# Patient Record
Sex: Male | Born: 1945 | Race: White | Hispanic: No | Marital: Single | Smoking: Never smoker
Health system: Southern US, Community
[De-identification: ages and names within clinical notes are randomized; demographics above are authoritative.]

## PROBLEM LIST (undated history)

## (undated) DIAGNOSIS — Z9889 Other specified postprocedural states: Secondary | ICD-10-CM

## (undated) DIAGNOSIS — F329 Major depressive disorder, single episode, unspecified: Secondary | ICD-10-CM

## (undated) DIAGNOSIS — IMO0001 Reserved for inherently not codable concepts without codable children: Secondary | ICD-10-CM

## (undated) DIAGNOSIS — R319 Hematuria, unspecified: Secondary | ICD-10-CM

## (undated) DIAGNOSIS — Z87442 Personal history of urinary calculi: Secondary | ICD-10-CM

## (undated) DIAGNOSIS — N35919 Unspecified urethral stricture, male, unspecified site: Secondary | ICD-10-CM

## (undated) DIAGNOSIS — R351 Nocturia: Secondary | ICD-10-CM

## (undated) DIAGNOSIS — G5600 Carpal tunnel syndrome, unspecified upper limb: Secondary | ICD-10-CM

## (undated) DIAGNOSIS — Z8546 Personal history of malignant neoplasm of prostate: Secondary | ICD-10-CM

## (undated) DIAGNOSIS — I10 Essential (primary) hypertension: Secondary | ICD-10-CM

## (undated) DIAGNOSIS — Z87820 Personal history of traumatic brain injury: Secondary | ICD-10-CM

## (undated) DIAGNOSIS — Z87448 Personal history of other diseases of urinary system: Secondary | ICD-10-CM

## (undated) DIAGNOSIS — Z87440 Personal history of urinary (tract) infections: Secondary | ICD-10-CM

## (undated) DIAGNOSIS — C61 Malignant neoplasm of prostate: Secondary | ICD-10-CM

## (undated) DIAGNOSIS — H269 Unspecified cataract: Secondary | ICD-10-CM

## (undated) DIAGNOSIS — R112 Nausea with vomiting, unspecified: Secondary | ICD-10-CM

## (undated) DIAGNOSIS — I2510 Atherosclerotic heart disease of native coronary artery without angina pectoris: Secondary | ICD-10-CM

## (undated) DIAGNOSIS — Z923 Personal history of irradiation: Secondary | ICD-10-CM

## (undated) DIAGNOSIS — F32A Depression, unspecified: Secondary | ICD-10-CM

## (undated) DIAGNOSIS — M1990 Unspecified osteoarthritis, unspecified site: Secondary | ICD-10-CM

## (undated) DIAGNOSIS — Z973 Presence of spectacles and contact lenses: Secondary | ICD-10-CM

## (undated) DIAGNOSIS — Z87898 Personal history of other specified conditions: Secondary | ICD-10-CM

## (undated) HISTORY — PX: POLYPECTOMY: SHX149

## (undated) HISTORY — PX: OTHER SURGICAL HISTORY: SHX169

## (undated) HISTORY — PX: COLONOSCOPY: SHX174

## (undated) HISTORY — DX: Unspecified cataract: H26.9

## (undated) HISTORY — PX: CARDIOVASCULAR STRESS TEST: SHX262

---

## 2002-10-30 ENCOUNTER — Encounter: Admission: RE | Admit: 2002-10-30 | Discharge: 2002-10-30 | Payer: Self-pay | Admitting: Internal Medicine

## 2002-10-30 ENCOUNTER — Encounter: Payer: Self-pay | Admitting: Internal Medicine

## 2002-11-20 ENCOUNTER — Encounter: Admission: RE | Admit: 2002-11-20 | Discharge: 2002-11-20 | Payer: Self-pay | Admitting: Internal Medicine

## 2002-11-20 ENCOUNTER — Encounter: Payer: Self-pay | Admitting: Internal Medicine

## 2002-12-04 ENCOUNTER — Encounter: Admission: RE | Admit: 2002-12-04 | Discharge: 2002-12-04 | Payer: Self-pay | Admitting: Orthopedic Surgery

## 2002-12-04 ENCOUNTER — Encounter: Payer: Self-pay | Admitting: Orthopedic Surgery

## 2002-12-18 ENCOUNTER — Encounter: Admission: RE | Admit: 2002-12-18 | Discharge: 2002-12-18 | Payer: Self-pay | Admitting: Orthopedic Surgery

## 2002-12-18 ENCOUNTER — Encounter: Payer: Self-pay | Admitting: Orthopedic Surgery

## 2003-01-01 ENCOUNTER — Encounter: Admission: RE | Admit: 2003-01-01 | Discharge: 2003-01-01 | Payer: Self-pay | Admitting: Orthopedic Surgery

## 2003-01-01 ENCOUNTER — Encounter: Payer: Self-pay | Admitting: Orthopedic Surgery

## 2003-07-31 ENCOUNTER — Encounter: Admission: RE | Admit: 2003-07-31 | Discharge: 2003-07-31 | Payer: Self-pay | Admitting: Internal Medicine

## 2003-07-31 ENCOUNTER — Encounter: Payer: Self-pay | Admitting: Internal Medicine

## 2003-08-15 ENCOUNTER — Emergency Department (HOSPITAL_COMMUNITY): Admission: EM | Admit: 2003-08-15 | Discharge: 2003-08-15 | Payer: Self-pay | Admitting: Emergency Medicine

## 2003-08-15 ENCOUNTER — Encounter: Payer: Self-pay | Admitting: Emergency Medicine

## 2003-09-04 ENCOUNTER — Ambulatory Visit (HOSPITAL_COMMUNITY): Admission: RE | Admit: 2003-09-04 | Discharge: 2003-09-04 | Payer: Self-pay | Admitting: Urology

## 2003-09-04 ENCOUNTER — Ambulatory Visit (HOSPITAL_BASED_OUTPATIENT_CLINIC_OR_DEPARTMENT_OTHER): Admission: RE | Admit: 2003-09-04 | Discharge: 2003-09-04 | Payer: Self-pay | Admitting: Urology

## 2003-09-04 HISTORY — PX: OTHER SURGICAL HISTORY: SHX169

## 2003-09-18 ENCOUNTER — Inpatient Hospital Stay (HOSPITAL_COMMUNITY): Admission: EM | Admit: 2003-09-18 | Discharge: 2003-09-23 | Payer: Self-pay | Admitting: Emergency Medicine

## 2003-09-18 IMAGING — CT CT CHEST LIMITED W/O CM
1 series · 16 of 25 positions shown, 20 images · non-contrast
Comparison: none

CLINICAL DATA: Nodular density seen on chest x-ray.  Fever, cough and nausea.  
 LIMITED CT SCAN OF THE CHEST WITHOUT CONTRAST [DATE]
 Scans through the lung bases without contrast demonstrate no discrete lung nodule or focal infiltrate or atelectasis.  The density seen on chest x-ray probably represented a confluence of vascular structures.  
 IMPRESSION
 No evidence of nodule or other significant abnormality at the left lung base.

[Series 2: chest w/ · axial · 0.66mm/px · z∈[-310,-200]mm · 16 of 25 slices shown, 20 images]
[im 2/25  mediastinal]
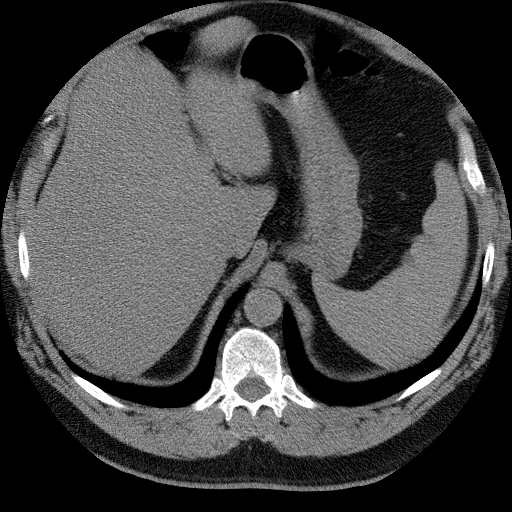
[im 2/25  lung]
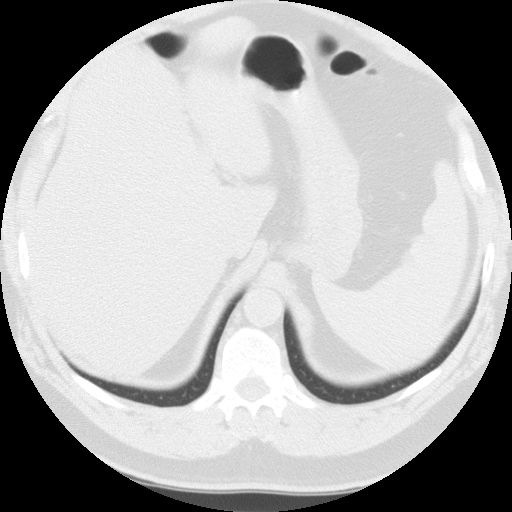
[im 4/25  lung]
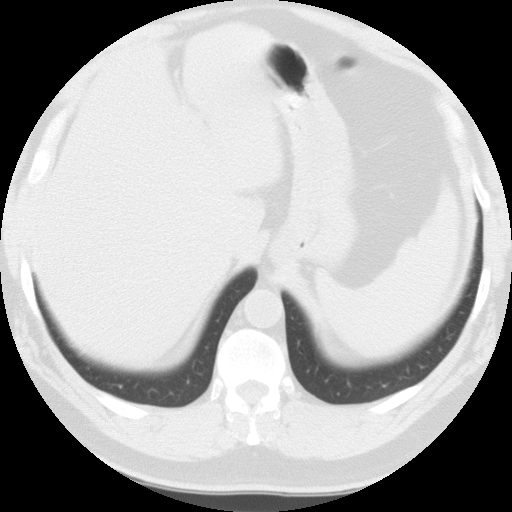
[im 5/25  lung]
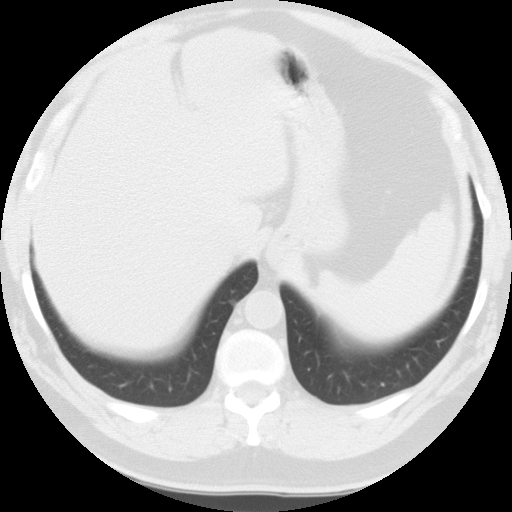
[im 7/25  lung]
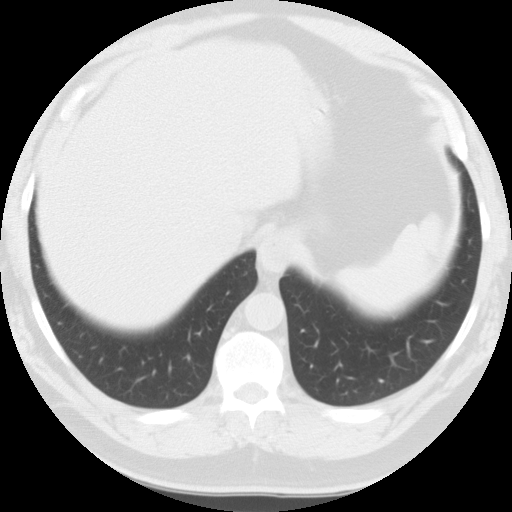
[im 8/25  mediastinal]
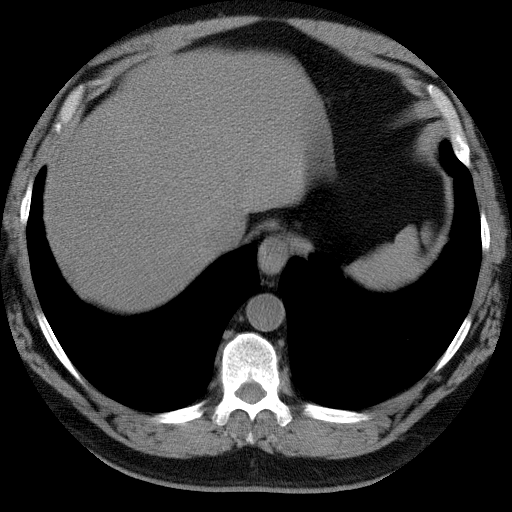
[im 8/25  lung]
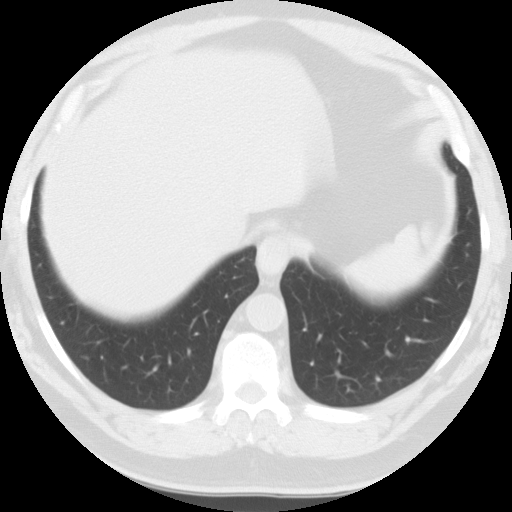
[im 9/25  lung]
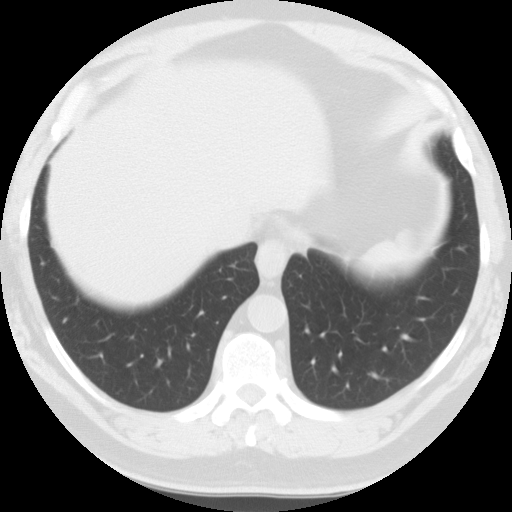
[im 11/25  lung]
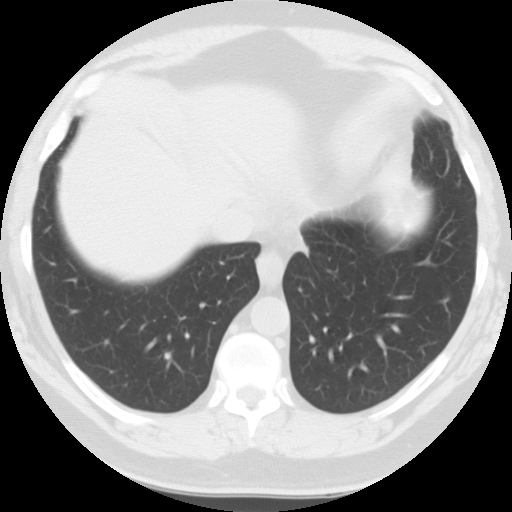
[im 12/25  lung]
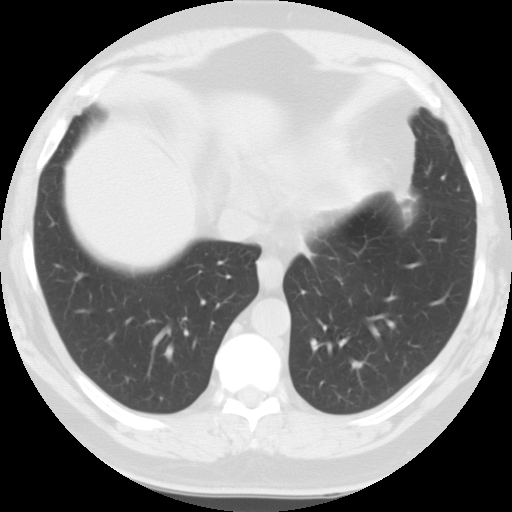
[im 14/25  mediastinal]
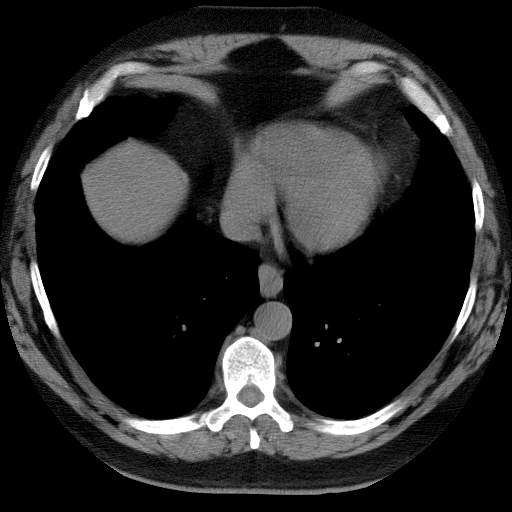
[im 14/25  lung]
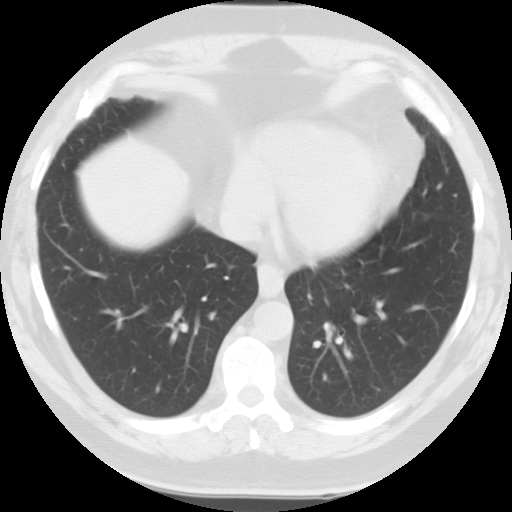
[im 15/25  lung]
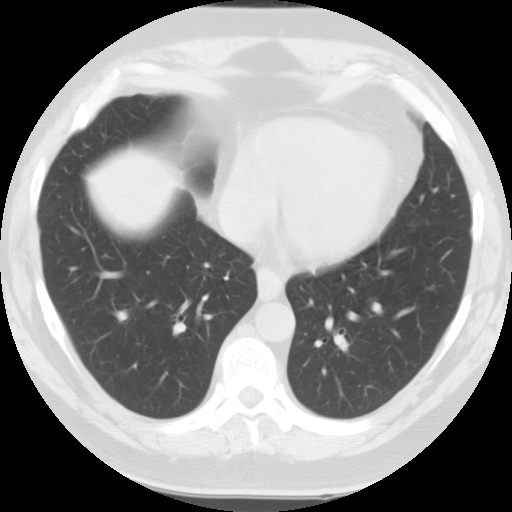
[im 17/25  lung]
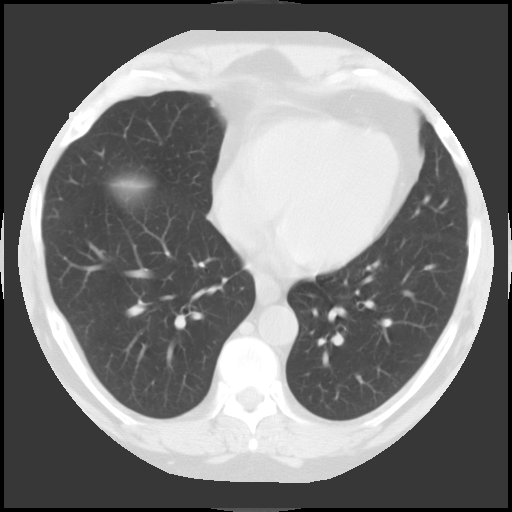
[im 18/25  lung]
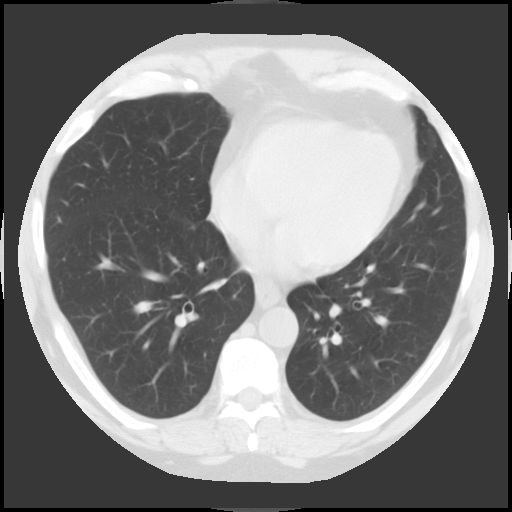
[im 19/25  mediastinal]
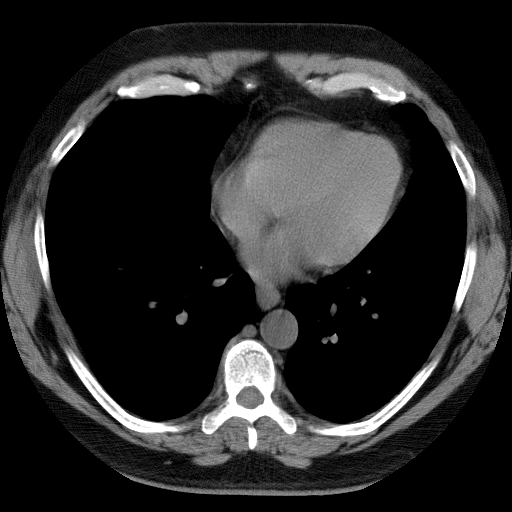
[im 19/25  lung]
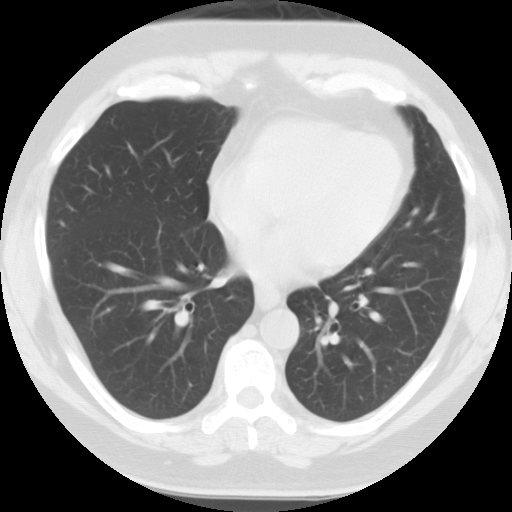
[im 21/25  lung]
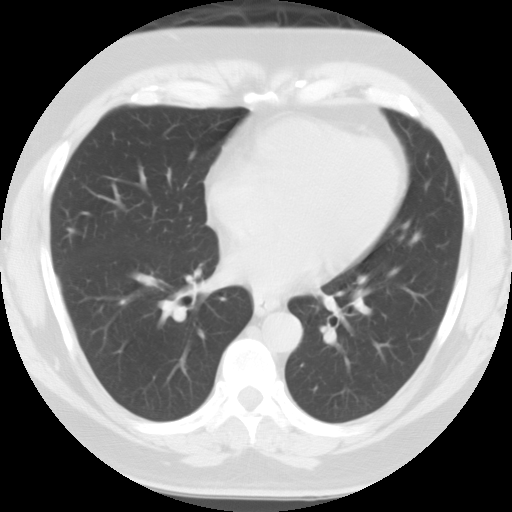
[im 22/25  lung]
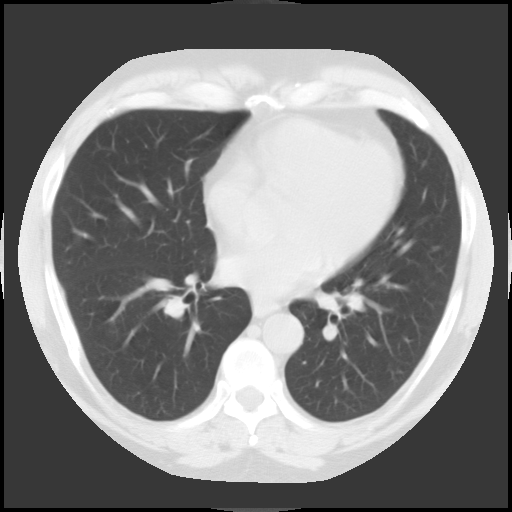
[im 24/25  lung]
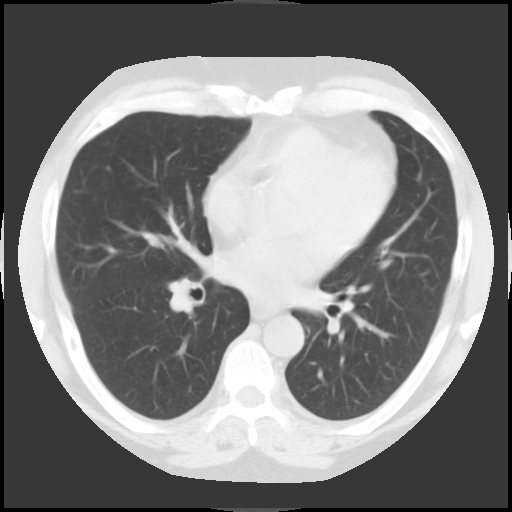

[16 of 25 positions shown; findings below may reference images not displayed]

## 2003-09-22 ENCOUNTER — Encounter: Payer: Self-pay | Admitting: Cardiology

## 2003-09-22 HISTORY — PX: TRANSTHORACIC ECHOCARDIOGRAM: SHX275

## 2003-12-18 ENCOUNTER — Inpatient Hospital Stay (HOSPITAL_BASED_OUTPATIENT_CLINIC_OR_DEPARTMENT_OTHER): Admission: RE | Admit: 2003-12-18 | Discharge: 2003-12-18 | Payer: Self-pay | Admitting: Cardiology

## 2003-12-18 HISTORY — PX: CARDIAC CATHETERIZATION: SHX172

## 2005-04-14 ENCOUNTER — Ambulatory Visit: Payer: Self-pay | Admitting: Cardiology

## 2005-06-07 ENCOUNTER — Ambulatory Visit: Payer: Self-pay

## 2006-01-11 ENCOUNTER — Ambulatory Visit: Payer: Self-pay | Admitting: Cardiology

## 2007-01-15 ENCOUNTER — Ambulatory Visit: Payer: Self-pay | Admitting: Cardiology

## 2008-05-08 ENCOUNTER — Ambulatory Visit: Payer: Self-pay | Admitting: Cardiology

## 2008-05-13 ENCOUNTER — Ambulatory Visit: Payer: Self-pay

## 2009-10-27 ENCOUNTER — Encounter: Payer: Self-pay | Admitting: Cardiology

## 2009-11-26 DIAGNOSIS — I2510 Atherosclerotic heart disease of native coronary artery without angina pectoris: Secondary | ICD-10-CM | POA: Insufficient documentation

## 2009-11-26 DIAGNOSIS — I10 Essential (primary) hypertension: Secondary | ICD-10-CM | POA: Insufficient documentation

## 2009-11-26 DIAGNOSIS — E785 Hyperlipidemia, unspecified: Secondary | ICD-10-CM | POA: Insufficient documentation

## 2009-11-27 ENCOUNTER — Ambulatory Visit: Payer: Self-pay | Admitting: Cardiology

## 2010-08-12 ENCOUNTER — Ambulatory Visit
Admission: RE | Admit: 2010-08-12 | Discharge: 2010-11-10 | Payer: Self-pay | Source: Home / Self Care | Attending: Radiation Oncology | Admitting: Radiation Oncology

## 2010-10-20 ENCOUNTER — Encounter: Admission: RE | Admit: 2010-10-20 | Discharge: 2010-10-20 | Payer: Self-pay | Admitting: Urology

## 2010-10-20 IMAGING — CR DG CHEST 2V
2 series · 2 of 2 positions shown · non-contrast
Comparison: CT chest limited [DATE].

CLINICAL DATA: 64-year-old male preoperative exam for prostate
treatment.

CHEST - 2 VIEW

[w chest pa]
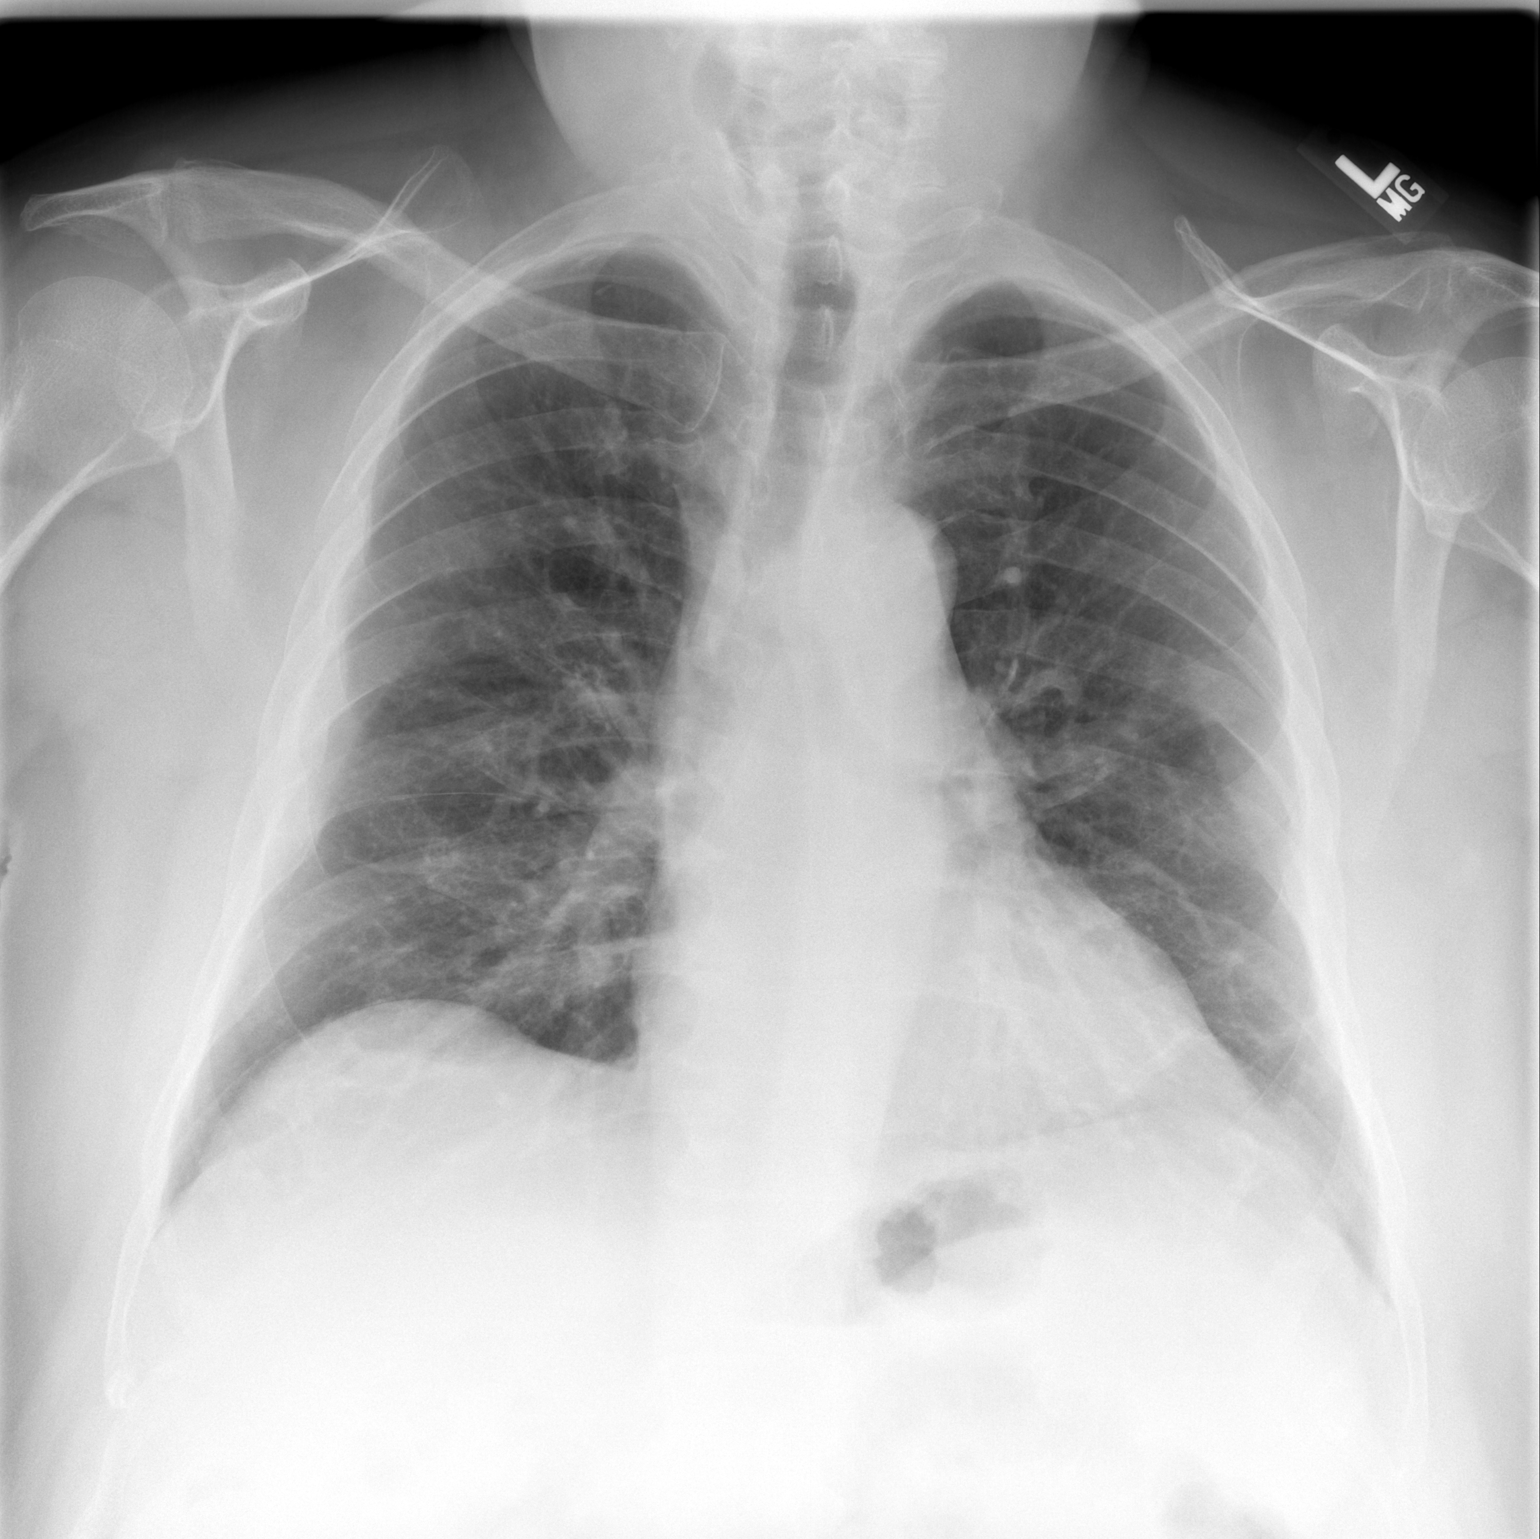

[w chest lat]
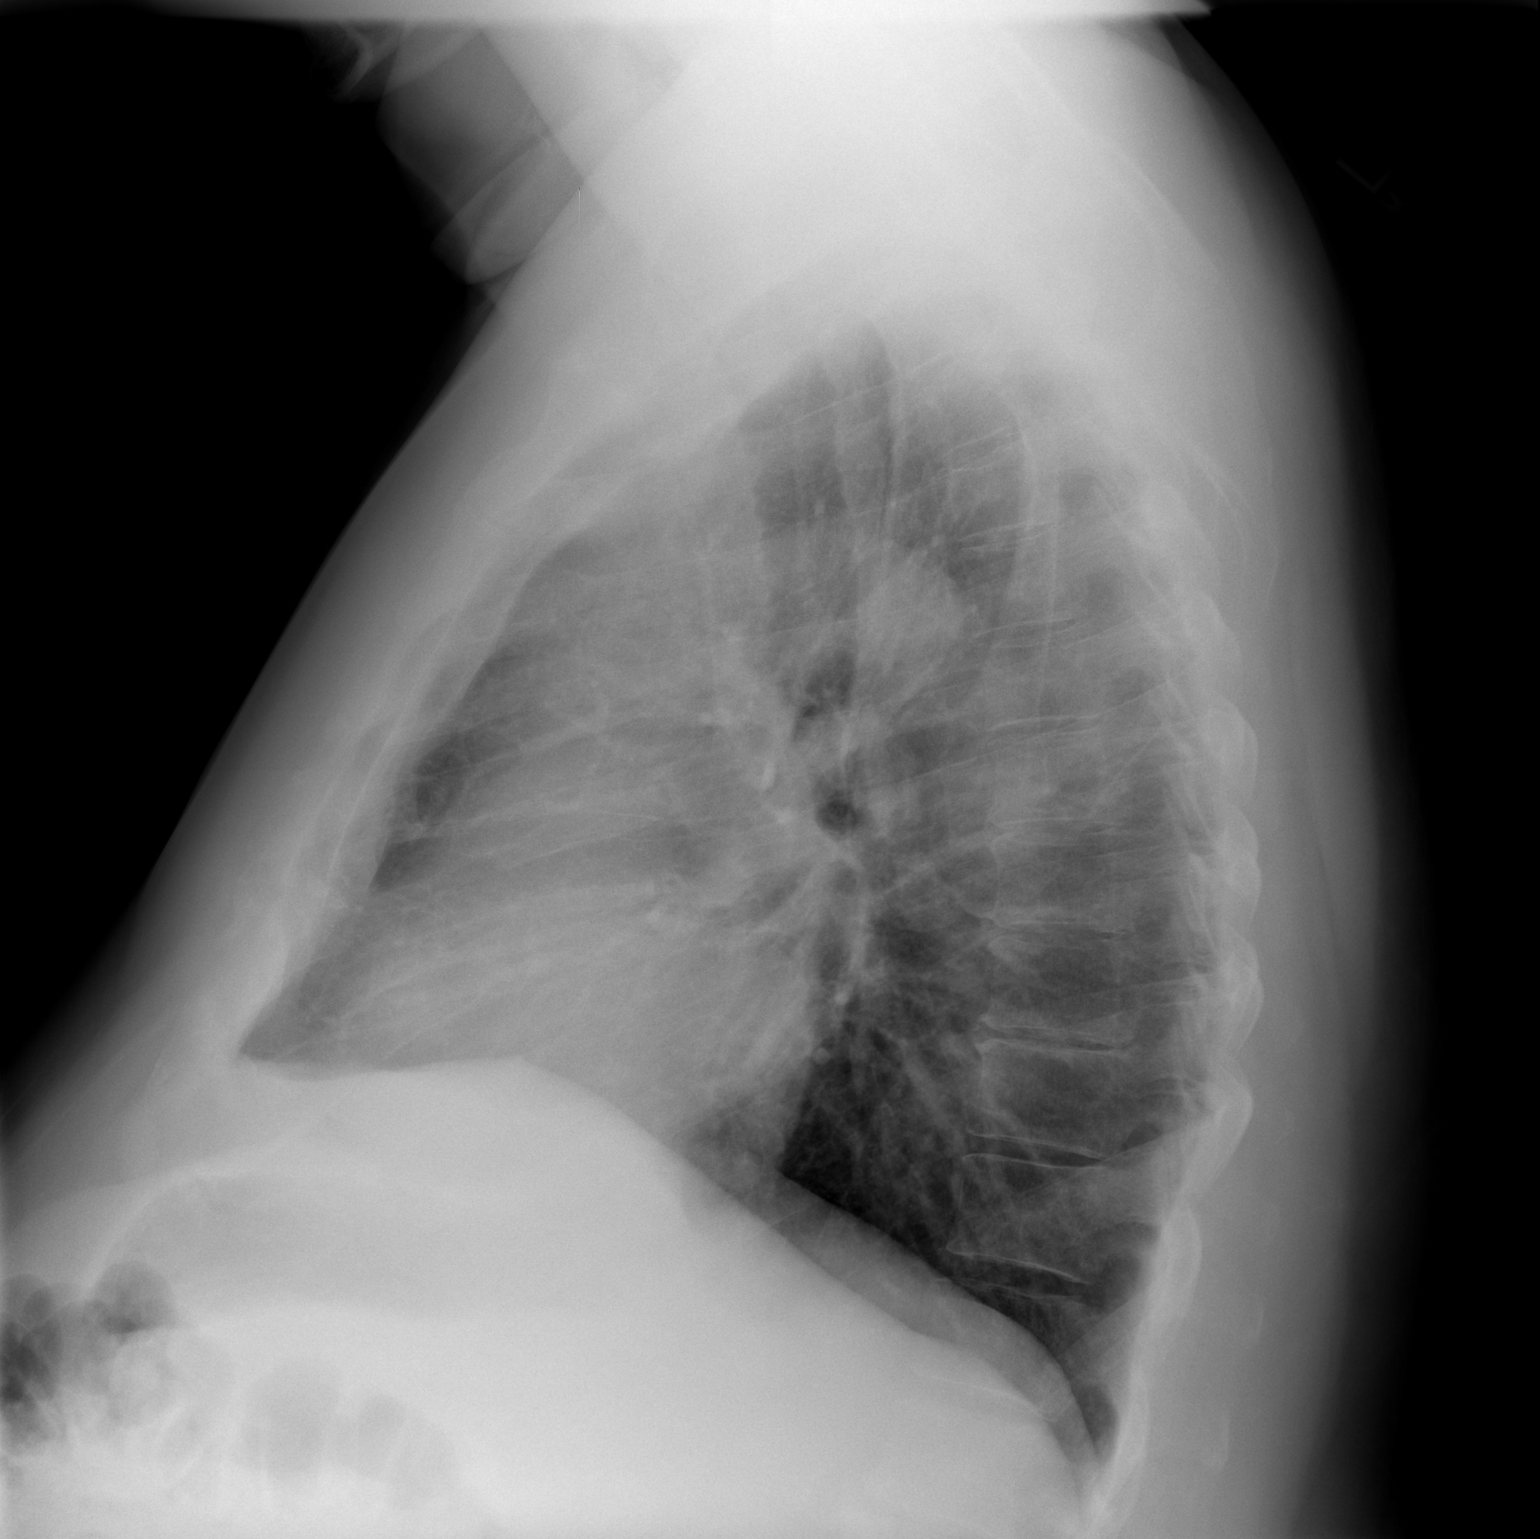

[2 of 2 positions shown; findings below may reference images not displayed]

FINDINGS: Lung volumes are within normal limits.  Cardiac size and
mediastinal contours are within normal limits.  Visualized tracheal
air column is within normal limits.  Aside from mild increased
interstitial markings diffusely, the lungs are clear with no
pneumothorax, pulmonary edema, pleural effusion or confluent
pulmonary opacity.  Multilevel  chronic right lateral rib
fractures.  Occasional chronic left lateral rib fractures. No acute
osseous abnormality identified.
IMPRESSION: No acute cardiopulmonary abnormality.

## 2010-11-26 ENCOUNTER — Ambulatory Visit
Admission: RE | Admit: 2010-11-26 | Discharge: 2010-11-26 | Payer: Self-pay | Source: Home / Self Care | Attending: Urology | Admitting: Urology

## 2010-11-26 HISTORY — PX: OTHER SURGICAL HISTORY: SHX169

## 2010-12-16 ENCOUNTER — Encounter: Payer: Self-pay | Admitting: Cardiology

## 2010-12-16 ENCOUNTER — Ambulatory Visit
Admission: RE | Admit: 2010-12-16 | Discharge: 2010-12-21 | Payer: Self-pay | Source: Home / Self Care | Attending: Radiation Oncology | Admitting: Radiation Oncology

## 2010-12-21 ENCOUNTER — Ambulatory Visit
Admission: RE | Admit: 2010-12-21 | Discharge: 2010-12-21 | Payer: Self-pay | Source: Home / Self Care | Attending: Cardiology | Admitting: Cardiology

## 2010-12-21 ENCOUNTER — Encounter: Payer: Self-pay | Admitting: Cardiology

## 2010-12-21 NOTE — Assessment & Plan Note (Signed)
Summary: f1y/jss  Medications Added METOPROLOL SUCCINATE 50 MG XR24H-TAB (METOPROLOL SUCCINATE) 1 1/2 tab by mouth once daily RAMIPRIL 10 MG CAPS (RAMIPRIL) Take one capsule by mouth daily VITAMIN C 1000 MG TABS (ASCORBIC ACID) 1 tab by mouth once daily        CC:  yearly visit.  History of Present Illness: Sean [name] is a pleasant gentleman who has a history of coronary artery disease.  In January 2005, he underwent cardiac catheterization.  At that time, he was found to have a first diagonal with an ostial 95% stenosis.  The circumflex had 95% stenosis in the ostium of large first OM that was occluded after the first marginal.  There was collateral flow from the LAD to the obtuse marginals.  His ejection fraction was 50%.  He has been treated medically since then.  I last saw him in June 2009. His last Myoview was performed at that time. Ejection fraction was 54%. There was a small prior inferior lateral infarct and mild peri-infarct ischemia as well as mild apical ischemia. He was felt to be low risk and we have treated medically. Since I last saw him the patient denies any dyspnea on exertion, orthopnea, PND, pedal edema, palpitations, syncope or chest pain.   Current Medications (verified): 1)  Aspirin Ec 325 Mg Tbec (Aspirin) .... Take One Tablet By Mouth Daily 2)  Allopurinol 100 Mg Tabs (Allopurinol) .Marland Kitchen.. 1 Tab By Mouth Once Daily 3)  Lipitor 80 Mg Tabs (Atorvastatin Calcium) .... Take One Tablet By Mouth Daily. 4)  Multivitamins   Tabs (Multiple Vitamin) .Marland Kitchen.. 1 Tab By Mouth Once Daily 5)  Metoprolol Succinate 50 Mg Xr24h-Tab (Metoprolol Succinate) .Marland Kitchen.. 1 1/2 Tab By Mouth Once Daily 6)  Ramipril 10 Mg Caps (Ramipril) .... Take One Capsule By Mouth Daily 7)  Vitamin C 1000 Mg Tabs (Ascorbic Acid) .Marland Kitchen.. 1 Tab By Mouth Once Daily  Past History:  Past Medical History: Current Problems:  HYPERLIPIDEMIA (ICD-272.4) HYPERTENSION (ICD-401.9) CAD (ICD-414.00) History of  nephrolithiasis. History of low back pain. gout  Past Surgical History: Reviewed history from 11/26/2009 and no changes required. Cystoscopy  Social History: Reviewed history from 11/26/2009 and no changes required.   No tobacco, no alcohol.  Review of Systems       Problems with gout but no fevers or chills, productive cough, hemoptysis, dysphasia, odynophagia, melena, hematochezia, dysuria, hematuria, rash, seizure activity, orthopnea, PND, pedal edema, claudication. Remaining systems are negative.   Vital Signs:  Patient profile:   65 year old male Height:      71 inches Weight:      282 pounds BMI:     39.47 Pulse rate:   69 / minute Resp:     12 per minute BP sitting:   142 / 80  (left arm)  Vitals Entered By: Kem Parkinson (November 27, 2009 11:36 AM)  Physical Exam  General:  Well-developed obese in no acute distress.  Skin is warm and dry.  HEENT is normal.  Neck is supple. No thyromegaly.  Chest is clear to auscultation with normal expansion.  Cardiovascular exam is regular rate and rhythm.  Abdominal exam nontender or distended. No masses palpated. Extremities show no edema. neuro grossly intact    EKG  Procedure date:  11/27/2009  Findings:      Sinus rhythm at a rate of 69. Axis normal. No ST changes.  Impression & Recommendations:  Problem # 1:  CAD (ICD-414.00) Continue aspirin, beta blocker, ACE inhibitor and  statin. Continue risk factor modification. Needs weight loss. Plan repeat Myoview when he returns in one year. His updated medication list for this problem includes:    Aspirin Ec 325 Mg Tbec (Aspirin) .Marland Kitchen... Take one tablet by mouth daily    Metoprolol Succinate 50 Mg Xr24h-tab (Metoprolol succinate) .Marland Kitchen... 1 1/2 tab by mouth once daily    Ramipril 10 Mg Caps (Ramipril) .Marland Kitchen... Take one capsule by mouth daily  Problem # 2:  HYPERTENSION (ICD-401.9) Blood pressure mildly elevated but typically normal by his report. He will follow this and  we will add additional medications as needed. His primary care physician follows his potassium and renal function. His updated medication list for this problem includes:    Aspirin Ec 325 Mg Tbec (Aspirin) .Marland Kitchen... Take one tablet by mouth daily    Metoprolol Succinate 50 Mg Xr24h-tab (Metoprolol succinate) .Marland Kitchen... 1 1/2 tab by mouth once daily    Ramipril 10 Mg Caps (Ramipril) .Marland Kitchen... Take one capsule by mouth daily  Problem # 3:  HYPERLIPIDEMIA (ICD-272.4) continue statin. Lipids and liver monitored by his primary care. His updated medication list for this problem includes:    Lipitor 80 Mg Tabs (Atorvastatin calcium) .Marland Kitchen... Take one tablet by mouth daily.  Patient Instructions: 1)  Your physician recommends that you schedule a follow-up appointment in: ONE YEAR

## 2010-12-21 NOTE — Miscellaneous (Signed)
  Clinical Lists Changes  Medications: Added new medication of ASPIRIN EC 325 MG TBEC (ASPIRIN) Take one tablet by mouth daily Added new medication of ALLOPURINOL 100 MG TABS (ALLOPURINOL) 1 tab by mouth once daily Added new medication of LIPITOR 80 MG TABS (ATORVASTATIN CALCIUM) Take one tablet by mouth daily. Added new medication of MULTIVITAMINS   TABS (MULTIPLE VITAMIN) 1 tab by mouth once daily

## 2010-12-22 NOTE — Op Note (Signed)
  NAME:  Sean [name], Sean [name]                 ACCOUNT NO.:  [id number]  MEDICAL RECORD NO.:  [id number]          PATIENT TYPE:  AMB  LOCATION:  [facility]                         FACILITY:  [facility]  PHYSICIAN:  Artist Pais. Kathrynn Running, M.D.DATE OF BIRTH:  [birth year]  DATE OF PROCEDURE:  11/26/2010 DATE OF DISCHARGE:                              OPERATIVE REPORT   PREOPERATIVE DIAGNOSIS:  Adenocarcinoma of the prostate, clinical stage T2a, Gleason 3+4.  POSTOPERATIVE DIAGNOSIS:  Adenocarcinoma of the prostate, clinical stage T2a, Gleason 3+4.  PRINCIPAL PROCEDURE:  I-125 brachytherapy.  SURGEON:  Bertram Millard. Ifrah Vest, M.D. for radiation oncology Margaretmary Dys, MD  ANESTHESIA:  General with LMA.  COMPLICATIONS:  None.  BRIEF HISTORY:  This 65 year old male comes in today for brachytherapy to complete combination radiotherapy.  He originally presented in July of this year with an abnormal prostate exam as well as a PSA of 4.7. Five of twelve biopsies revealed adenocarcinoma.  He elected to have radiotherapy and has completed external beam radiotherapy and presents at this time for completion of his therapy with brachytherapy.  Risks and complications have been discussed with the patient and he desires to proceed.  DESCRIPTION OF PROCEDURE:  The patient was administered general anesthetic after he was identified in the holding area and received preoperative IV antibiotics.  He was placed in the dorsal lithotomy position.  Genitalia, perineum and lower abdomen were prepped and draped after of rectal tube and Foley catheter was placed.  Dr. Kathrynn Running performed scanning of his prostate with a transrectal ultrasound probe. The brachytherapy template and fixation needles were also placed. Following planning, a total number of 69 seeds were placed using 21 needles and placed according to the treatment plan.  Please see the SPOT- PRO page for specifics about dosage.  Following placement of all seeds by  the needles, the fixation needles and template were removed.  The Foley catheter was removed and cystoscopy revealed an undamaged urethra, no evidence of bladder abnormalities.  No Foley was placed.  The patient was awakened and then taken to PACU in stable condition.  He will follow up with both Dr. Kathrynn Running and myself in approximately 3 weeks.     Bertram Millard. Yasaman Kolek, M.D.   ______________________________ Artist Pais. Kathrynn Running, M.D.    SMD/MEDQ  D:  11/26/2010  T:  11/26/2010  Job:  [id number]  cc:   Artist Pais Kathrynn Running, M.D. Fax: [phone]  Electronically Signed by Marcine Matar M.D. on 12/22/2010 06:01:49 PM

## 2010-12-29 NOTE — Assessment & Plan Note (Signed)
Summary: F1Y/DM      Allergies Added: ! * LEREUM  Visit Type:  Follow-up   History of Present Illness: Sean [name] is a pleasant gentleman who has a history of coronary artery disease.  In January 2005, he underwent cardiac catheterization.  At that time, he was found to have a first diagonal with an ostial 95% stenosis.  The circumflex had 95% stenosis in the ostium of large first OM that was occluded after the first marginal.  There was collateral flow from the LAD to the obtuse marginals.  His ejection fraction was 50%.  He has been treated medically since then. His last Myoview was performed in June of 2009. His Ejection fraction was 54%. There was a small prior inferior lateral infarct and mild peri-infarct ischemia as well as mild apical ischemia. It was felt to be low risk and we have treated medically. Since I last saw him in Jan 2011, the patient denies any dyspnea on exertion, orthopnea, PND, pedal edema, palpitations, syncope or chest pain.  Current Medications (verified): 1)  Aspirin Ec 325 Mg Tbec (Aspirin) .... Take One Tablet By Mouth Daily 2)  Allopurinol 100 Mg Tabs (Allopurinol) .Marland Kitchen.. 1 Tab By Mouth Once Daily 3)  Lipitor 80 Mg Tabs (Atorvastatin Calcium) .... Take One Tablet By Mouth Daily. 4)  Multivitamins   Tabs (Multiple Vitamin) .Marland Kitchen.. 1 Tab By Mouth Once Daily 5)  Metoprolol Succinate 50 Mg Xr24h-Tab (Metoprolol Succinate) .Marland Kitchen.. 1 1/2 Tab By Mouth Once Daily 6)  Ramipril 10 Mg Caps (Ramipril) .... Take One Capsule By Mouth Daily 7)  Vitamin C 1000 Mg Tabs (Ascorbic Acid) .Marland Kitchen.. 1 Tab By Mouth Once Daily  Allergies (verified): 1)  ! Peggyann Shoals  Past History:  Past Medical History: HYPERLIPIDEMIA (ICD-272.4) HYPERTENSION (ICD-401.9) CAD (ICD-414.00) History of nephrolithiasis. History of low back pain. gout Prostate cancer  Past Surgical History: Reviewed history from 11/26/2009 and no changes required. Cystoscopy  Social History: Reviewed history from 11/26/2009  and no changes required.   No tobacco, no alcohol.  Review of Systems       Patient has recently been diagnosed with prostate cancer and is undergoing radiation. no fevers or chills, productive cough, hemoptysis, dysphasia, odynophagia, melena, hematochezia, dysuria, hematuria, rash, seizure activity, orthopnea, PND, pedal edema, claudication. Remaining systems are negative.   Vital Signs:  Patient profile:   65 year old male Height:      71 inches Weight:      287 pounds BMI:     40.17 Pulse rate:   67 / minute BP sitting:   140 / 80  (left arm)  Vitals Entered By: Laurance Flatten CMA (December 21, 2010 11:54 AM)  Physical Exam  General:  Well-developed well-nourished in no acute distress.  Skin is warm and dry.  HEENT is normal.  Neck is supple. No thyromegaly.  Chest is clear to auscultation with normal expansion.  Cardiovascular exam is regular rate and rhythm.  Abdominal exam nontender or distended. No masses palpated. Extremities show no edema. neuro grossly intact    EKG  Procedure date:  12/21/2010  Findings:      Normal sinus rhythm at a rate of 67. First degree AV block. No ST changes.  Impression & Recommendations:  Problem # 1:  CAD (ICD-414.00)  Continue aspirin, ACE inhibitor, beta blocker and statin. We discussed Myoview today. He would like to delay this for 5-6 months until his radiation therapy for his prostate cancer is complete. He will contact us when  he is ready to proceed. His updated medication list for this problem includes:    Aspirin Ec 325 Mg Tbec (Aspirin) .Marland Kitchen... Take one tablet by mouth daily    Metoprolol Succinate 50 Mg Xr24h-tab (Metoprolol succinate) .Marland Kitchen... 1 1/2 tab by mouth once daily    Ramipril 10 Mg Caps (Ramipril) .Marland Kitchen... Take one capsule by mouth daily  His updated medication list for this problem includes:    Aspirin Ec 325 Mg Tbec (Aspirin) .Marland Kitchen... Take one tablet by mouth daily    Metoprolol Succinate 50 Mg Xr24h-tab (Metoprolol  succinate) .Marland Kitchen... 1 1/2 tab by mouth once daily    Ramipril 10 Mg Caps (Ramipril) .Marland Kitchen... Take one capsule by mouth daily  Orders: EKG w/ Interpretation (93000) Nuclear Stress Test (Nuc Stress Test)  Problem # 2:  HYPERTENSION (ICD-401.9) Continue present blood pressure medications. His potassium and renal function are followed by primary care. His updated medication list for this problem includes:    Aspirin Ec 325 Mg Tbec (Aspirin) .Marland Kitchen... Take one tablet by mouth daily    Metoprolol Succinate 50 Mg Xr24h-tab (Metoprolol succinate) .Marland Kitchen... 1 1/2 tab by mouth once daily    Ramipril 10 Mg Caps (Ramipril) .Marland Kitchen... Take one capsule by mouth daily  His updated medication list for this problem includes:    Aspirin Ec 325 Mg Tbec (Aspirin) .Marland Kitchen... Take one tablet by mouth daily    Metoprolol Succinate 50 Mg Xr24h-tab (Metoprolol succinate) .Marland Kitchen... 1 1/2 tab by mouth once daily    Ramipril 10 Mg Caps (Ramipril) .Marland Kitchen... Take one capsule by mouth daily  Problem # 3:  HYPERLIPIDEMIA (ICD-272.4)  Continue statin. Lipids and liver monitored by primary care. His updated medication list for this problem includes:    Lipitor 80 Mg Tabs (Atorvastatin calcium) .Marland Kitchen... Take one tablet by mouth daily.  His updated medication list for this problem includes:    Lipitor 80 Mg Tabs (Atorvastatin calcium) .Marland Kitchen... Take one tablet by mouth daily.  Patient Instructions: 1)  Your physician recommends that you schedule a follow-up appointment in: 1 year 2)  Your physician recommends that you continue on your current medications as directed. Please refer to the Current Medication list given to you today. 3)  Your physician has requested that you have an exercise stress myoview.  For further information please visit [email or web address]  Please follow instruction sheet, as given. Please call us when you are ready to schedule. You can call Stanton Kidney or [place] @ [phone] to set this up.

## 2011-01-03 ENCOUNTER — Ambulatory Visit: Payer: PRIVATE HEALTH INSURANCE | Attending: Radiation Oncology | Admitting: Radiation Oncology

## 2011-01-03 DIAGNOSIS — C61 Malignant neoplasm of prostate: Secondary | ICD-10-CM | POA: Insufficient documentation

## 2011-01-06 NOTE — Letter (Signed)
Summary: [facility] Office Visit Note   [facility] Office Visit Note   Imported By: Roderic Ovens 12/31/2010 15:43:41  _____________________________________________________________________  External Attachment:    Type:   Image     Comment:   External Document

## 2011-01-31 LAB — COMPREHENSIVE METABOLIC PANEL
ALT: 42 U/L (ref 0–53)
AST: 31 U/L (ref 0–37)
Albumin: 4 g/dL (ref 3.5–5.2)
Alkaline Phosphatase: 71 U/L (ref 39–117)
BUN: 19 mg/dL (ref 6–23)
CO2: 30 mEq/L (ref 19–32)
Calcium: 9.7 mg/dL (ref 8.4–10.5)
Chloride: 103 mEq/L (ref 96–112)
Creatinine, Ser: 1.18 mg/dL (ref 0.4–1.5)
GFR calc Af Amer: >60 mL/min (ref >60)
GFR calc non Af Amer: >60 mL/min (ref >60)
Glucose, Bld: 102 mg/dL — ABNORMAL HIGH (ref 70–99)
Potassium: 4.6 mEq/L (ref 3.5–5.1)
Sodium: 141 mEq/L (ref 135–145)
Total Bilirubin: 0.8 mg/dL (ref 0.3–1.2)
Total Protein: 6.7 g/dL (ref 6.0–8.3)

## 2011-01-31 LAB — CBC
HCT: 45 % (ref 39.0–52.0)
Hemoglobin: 15.4 g/dL (ref 13.0–17.0)
MCH: 32.2 pg (ref 26.0–34.0)
MCHC: 34.2 g/dL (ref 30.0–36.0)
MCV: 94.1 fL (ref 78.0–100.0)
Platelets: 153 10*3/uL (ref 150–400)
RBC: 4.78 MIL/uL (ref 4.22–5.81)
RDW: 13.3 % (ref 11.5–15.5)
WBC: 5 10*3/uL (ref 4.0–10.5)

## 2011-01-31 LAB — APTT: aPTT: 33 seconds (ref 24–37)

## 2011-01-31 LAB — PROTIME-INR
INR: 1.01 (ref 0.00–1.49)
Prothrombin Time: 13.5 seconds (ref 11.6–15.2)

## 2011-04-05 NOTE — Assessment & Plan Note (Signed)
[facility]                            CARDIOLOGY OFFICE NOTE   Sean [name], Sean [name]                        MRN:          [id number]  DATE:05/08/2008                            DOB:          12-17-1945    Sean [name] is a pleasant gentleman who has a history of coronary artery  disease.  In January 2005, he underwent cardiac catheterization.  At  that time, he was found to have a first diagonal with an ostial 95%  stenosis.  The circumflex had 95% stenosis in the ostium of large first  OM that was occluded after the first marginal.  There was collateral  flow from the LAD to the obtuse marginals.  His ejection fraction was  50%.  He has been treated medically since then.  Since I last saw him,  he is doing well with no dyspnea, chest pain, palpitations, or syncope.  Note, his most recent Myoview was in July 2006.  At that time, he was  felt to be low-risk.   MEDICATIONS:  1. Aspirin 325 mg p.o. daily.  2. Allopurinol 100 mg p.o. daily.  3. Lipitor 80 mg p.o. daily.  4. Altace 10 mg p.o. daily.  5. Toprol 75 mg p.o. daily.  6. Multivitamin.  7. Vitamin C.   PHYSICAL EXAMINATION:  Today, shows a blood pressure of 121/80.  His  pulse of 66 and weight is 275 pounds.  HEENT:  Normal.  NECK:  Supple.  No bruits noted.  CHEST:  Clear.  CARDIOVASCULAR:  Regular rate and rhythm.  ABDOMEN:  No tenderness.  EXTREMITIES:  No edema.   His electrocardiogram shows a sinus rhythm at a rate of 71.  There are  no ST changes noted.   DIAGNOSES:  1. Coronary artery disease - Sean [name] is doing well from a symptomatic      standpoint.  However, it has been 3 years since his most recent      Myoview.  We will plan to proceed with an adenosine Myoview for      risk stratification.  If he has no ischemia, then we will continue      with medical therapy to include his aspirin, statin, ACE inhibitor,      and beta blocker.  2. Hypertension - his blood pressure is  adequately controlled on his      present medications.  I will have his most recent BMET forwarded to      [place] back to Dr. Ferd Hibbs. office.  3. Hyperlipidemia - he will continue on his statin.  I will have his      most recent lipids and liver forwarded to [place] for our records.   We discussed diet and exercise.  He does not smoke.  I will see him back  in 1 year.     Madolyn Frieze Jens Som, MD, [facility]  Electronically Signed    BSC/MedQ  DD: 05/08/2008  DT: 05/09/2008  Job #: [id number]   cc:   Gwen Pounds, MD

## 2011-04-08 NOTE — H&P (Signed)
NAME:  Sean [name], Sean [name] NO.:  [id number]   MEDICAL RECORD NO.:  [id number]                   PATIENT TYPE:  EMS   LOCATION:  [facility]                                 FACILITY:  [facility]   PHYSICIAN:  Gwen Pounds, M.D.                 DATE OF BIRTH:  Apr 12, 1946   DATE OF ADMISSION:  09/17/2003  DATE OF DISCHARGE:                                HISTORY & PHYSICAL   CHIEF COMPLAINT:  Fever, nausea, vomiting and dehydration.   HISTORY OF PRESENT ILLNESS:  A 65 year old male who had nephrolithiasis on  September 24 who went to see Dr. Etta Grandchild at urology. He underwent stone  removal and stent placement on September 04, 2003. He had the stent removed  Monday October 25. The only issue over this time frame was cramping and  discomfort. He has done better since the stent was removed. He came to the  ED tonight with over 24 hours of fever. He called last night at  approximately 6 p.m. with 101 temperature and lethargy. Dr. Etta Grandchild was going  to do a urine culture today. I told him I also told him I would see him in  the morning, but if he worsened to go to the emergency room. He did worsen,  had intractable nausea and vomiting and went to the ER. In the ER he had a  white blood cell count of 19,000. Chest x-ray was negative. CT scan was  negative. Urine was negative, but creatinine was up. In the ED he was given  IV fluids, Phenergan, IV Cipro, and Tylenol.   PAST MEDICAL HISTORY:  Hyperlipidemia, hypertension, nephrolithiasis, and  low back pain status post epidural injection.   MEDICATIONS:  1. Lipitor 20.  2. Altace 5.   ALLERGIES:  No known drug allergies, but narcotics make him ill.   SOCIAL HISTORY:  No tobacco, no alcohol.   FAMILY HISTORY:  All of his brothers and father have nephrolithiasis. Mother  has coronary artery disease.   REVIEW OF SYSTEMS:  No ears, nose, and throat symptoms except for his mouth  being dry and the patient being dehydrated. No  chest pain, no shortness of  breath. He has a mild cough and no sputum. He has nausea, vomiting,  constipation, no diarrhea. The abdomen is now soft and feeling better. No  other symptoms. Of note, he did take Cipro for seven days after the October  14 procedure. All other systems are reviewed and negative.   PHYSICAL EXAMINATION:  VITAL SIGNS:  Blood pressure 133/59, temperature  between 100.5 and 103.1. Heart rate between 105 and 136. Respiratory rate  20, saturating 95% on room air.  GENERAL:  Alert and oriented x3. Tired appearing.  HEENT:  Dry mouth. No exudate. Extraocular movements intact.  NECK:  No JVD, no lymphadenopathy.  PULMONARY:  Clear to auscultation bilaterally.  CARDIAC:  Regular.  ABDOMEN:  Soft, nontender, nondistended. Bowel sounds positive.  EXTREMITIES:  No clubbing, no cyanosis, no edema.   ANCILLARY DATA:  Urinalysis showed 11-20 red blood cells per high powered  field and only 3-6 white blood cells per high powered field. He had calcium  oxalate and uric acid crystals seen in the urine. The white count is 19,300.  Hemoglobin 13.5, platelet count 273. Blood pressures are currently pending.  Sodium 137, potassium 3.67, chloride 102, bicarb 22, BUN 35, creatinine 2.5,  this is down from 63 and 5.5 respectively on October 13. Glucose 140. LFTs  within normal limits. Amylase and lipase negative. Acute abdominal series:  Abdomen is nonspecific except for gas and stool in the bowels. Chest x-ray  was negative except for a questionable nodule then he went for a limited  chest CT, which showed no nodule. What they were calling a nodule was a  confluence of the vessels.   ASSESSMENT:  This is a 65 year old male with recent nephrolithiasis causing  obstructive uropathy followed by stone extraction and stenting. The stent  has recently been removed. Since the last procedure the patient now is with  fever, chills, lethargy, leukocytosis, dehydration, nausea, vomiting,  and  ill.   PLAN:  1. Admit for 23-hour observation.  2. Infectious source is presumed urinary. Continue Cipro and give one dose     of Rocephin.  3. Acute renal failure. We will continue to give IV fluids. Follow     creatinine.  4. Will check renal ultrasound.  5. Hypertension control. Hold the Altace.  6. Hyperlipidemia. Hold the Lipitor.  7. For pain and nausea will place on medication for control of these     symptoms.  8. Will obtain a urology consult.  9. Follow-up blood and urine cultures.                                                Gwen Pounds, M.D.    JMR/MEDQ  D:  09/18/2003  T:  09/18/2003  Job:  [id number]   cc:   Claudette Laws, M.D.  [address]  [place]  [place] [address]  Fax: [phone]   Gwen Pounds, M.D.  [address]  [place]  [place] [address]  Fax: [phone]

## 2011-04-08 NOTE — Consult Note (Signed)
NAME:  Sean [name], SELLITTO NO.:  [id number]   MEDICAL RECORD NO.:  [id number]                   PATIENT TYPE:  OBV   LOCATION:  4727                                 FACILITY:  [facility]   PHYSICIAN:   Bing, M.D.               DATE OF BIRTH:  [birth year]   DATE OF CONSULTATION:  09/20/2003  DATE OF DISCHARGE:                                   CONSULTATION   REFERRING PHYSICIAN:  Mark A. Perini, M.D.   PRIMARY CARE PHYSICIAN:  Gwen Pounds, M.D.   HISTORY OF PRESENT ILLNESS:  A 65 year old gentleman with throat discomfort,  EKG abnormalities and elevated troponin.  [name] has no cardiac history.  He has never been seen by a cardiologist nor been told of any cardiac  abnormalities by other physicians.  He is generally fairly active without  any dyspnea nor chest discomfort. He has recently had urologic problems and  was readmitted 2 days ago with fever plus acute on chronic renal  insufficiency.  He seems to be defervescing with antibiotics.  Blood  cultures have been negative.  He was initially noted to have sinus  tachycardia prompting cardiac markers to be obtained.   CPK and CPK/MB have been normal.  However, troponin has ranged from 1:1.4.  The patient's throat discomfort is with swallowing--he is said to have oral  Candidiasis.   PAST MEDICAL HISTORY:  Past medical history is notable for hyperlipidemia  and hypertension.  He has a history of nephrolithiasis and recently required  implantation of a ureteral stent.  He has chronic low back pain and has had  epidural injections in the past.   MEDICATIONS ON ADMISSION:  1. Atorvastatin 20 mg daily  2. Ramipril 5 mg daily.   ALLERGIES:  He reports adverse reactions to NARCOTICS in the past.   SOCIAL HISTORY:  Denies use of tobacco products or alcohol.   FAMILY HISTORY:  Multiple family members with nephrolithiasis; mother had  coronary disease.   REVIEW OF SYSTEMS:  Xerostomia; mild  nonproductive cough; recent mild  nausea; all other systems negative.   PHYSICAL EXAMINATION:  GENERAL:  On exam, a pleasant, somewhat pale  gentleman in no acute distress.  VITAL SIGNS:  The temperature is 99.3, blood pressure 110/65.  Heart rate 90  and regular.  Respirations 20.  O2 saturation 94% on room air.  HEENT:  Anicteric sclerae.  NECK:  Mild jugular venous distention; normal carotid upstrokes without  bruits.  ENDOCRINE:  No thyromegaly.  HEMATOPOIETIC:  No adenopathy.  SKIN:  Benign appearing nevus of right back.  LUNGS:  Clear.  CARDIAC:  Normal first and second heart sounds; fourth heart sound present.  ABDOMEN:  Soft and nontender.  No organomegaly.  EXTREMITIES:  No edema.  Normal distal pulses.  NEUROMUSCULAR:  Symmetric strength and tone.   LABS:  Initial laboratory notable for creatinine of 2.5, WBCs  were 19,300.  Troponin was 0.95 and repeated at 1.4.   EKG:  Sinus tachycardia; QT prolongation; nondiagnostic inferior Q waves; J  point elevation in leads V1-V2.   IMPRESSION:  [name] appears to be improving with antibiotics and fluid.  His creatinine has fallen substantially.  His temperature is much lower  today than it has been for the past 2 days. He has no symptoms to suggest  myocardial ischemia or infarction.  His throat discomfort appears to be of  pharyngeal origin.  His troponin is relatively high considering the degree  of renal insufficiency.  With multiple cardiovascular risk factors and minor  EKG abnormalities, evaluation is certainly warranted. We will start with an  echocardiogram to rule out previous inferior myocardial infarction.  If that  test is normal, we will plan for a Cardiolite study either on an inpatient  or outpatient basis.   This request for consultation is greatly appreciated.  We will be happy to  follow this nice gentleman with you.                                               [place] Bing, M.D.    RR/MEDQ  D:   09/20/2003  T:  09/20/2003  Job:  [id number]

## 2011-04-08 NOTE — Discharge Summary (Signed)
NAME:  Sean [name], Sean [name] NO.:  [id number]   MEDICAL RECORD NO.:  [id number]                   PATIENT TYPE:  INP   LOCATION:  4727                                 FACILITY:  [facility]   PHYSICIAN:  Gwen Pounds, M.D.                 DATE OF BIRTH:  20-Nov-1946   DATE OF ADMISSION:  09/18/2003  DATE OF DISCHARGE:  09/23/2003                                 DISCHARGE SUMMARY   PRIMARY CARE Sheenah Dimitroff:  Dr. Gwen Pounds.   PRIMARY CARDIOLOGIST:  Dr.  Bing.   PRIMARY UROLOGIST:  Dr. Derrell Lolling. Sural.   DISCHARGE DIAGNOSES:  1. Enterococcal urinary tract infection.  2. Nephrolithiasis.  3. Hyperlipidemia.  4. Hypertension.  5. Low back pain, status post epidurals.  6. Acute renal insufficiency with new creatinine up to 5.5 with his recent     stone, 2.5 on admission and 1.3 on discharge.  7. Hypokalemia.  8. Elevated troponin I and presumed underlying coronary artery disease.  9. Mild-to-moderate pulmonary regurgitation.   DISCHARGE MEDICATIONS:  1. Cipro 500 mg p.o. b.i.d. for eight more days.  2. Lipitor 20 mg p.o. daily.  3. Altace 5 mg p.o. daily.  4. Toprol-XL 25 mg p.o. daily.  5. Enteric-coated aspirin 325 mg p.o. daily.  6. Potassium chloride 20 mEq p.o. daily for two weeks.   DISCHARGE PROCEDURES:  1. Blood and urine cultures.  Urine culture revealed 75,000 colonies per     milliliter of sensitive enterococcal species.  2. Acute abdominal series from the emergency room which shows a moderate     amount of air and stool in the colon.  There is no free air or free     fluid, no dilated small bowel, benign-appearing abdomen, but there is a     vague small density in the left lung base laterally which may represent a     pulmonary nodule.  This was followed up with a limited chest computed     tomogram which showed no evidence of nodular or other significant     abnormality in the left base, presumed confluence of vessels.  3.  Ultrasound of the kidneys, bilateral, showed right-greater-than-left     renal collecting system fullness, left ureteropelvic junction stone may     be present, filling defect within the bladder, as described in the     report.  4. Computed tomogram of the kidneys showed new mild-to-moderate left     hydronephrosis without definite obstructing cause, relatively unchanged     mild-to-moderate right hydronephrosis, moderate right perinephric     stranding and right hydroureter, no evidence of obstructing ureteral     calculi.  Punctate bilateral nonobstructing renal calculi.  Mild     hydroureter is noted without definite obstructing cause of obstructing     ureteral calculus.  Mild prostatic enlargement noted.  5. Lateral neck:  No  acute abnormality.  6. Two-dimensional echocardiogram revealing a technically difficult study,     aortic valve mildly calcified, mild mitral valve calcifications and mild-     to-moderate pulmonary regurgitation.   CONSULTATIONS:  1. Dr. Hamilton Bing, cardiology.  2. Dr Etta Grandchild, urology.   HISTORY OF PRESENT ILLNESS:  Briefly, Sean [name] is a 65 year old man  who had nephrolithiasis on August 15, 2003, who went to Dr. Etta Grandchild at  urology.  He underwent attempt at stone removal and stent placement on  September 04, 2003.  At that time, he had a creatinine of 5.5.  The stent was  removed on September 15, 2003.  Since removal of the stone, he started  developing fevers, nausea and vomiting.  He eventually went to the emergency  room.  In the emergency room, he had a white count of 19,000, UA was  negative and he was clearly sick and I was called for inpatient admission.  Of note, his creatinine was 2.5 on admission.   HOSPITAL COURSE:  Sean [name] is a 65 year old male with recent nephrolithiasis  causing obstructive uropathy, followed by stone extraction and stenting.  The stent has recently been removed.  Since the last procedure, the patient  presented to  the emergency room with fevers, chills, lethargy, leukocytosis,  dehydration, nausea, vomiting and acutely ill.  In the emergency room, I  called Dr. Etta Grandchild and the two of [place] organized a CT scan and ultrasound  through his kidneys and also organized admission with IV fluids and IV  antibiotics.  Blood and urine cultures were obtained.  He was placed on  ciprofloxacin IV and Rocephin IV prophylactically.   Eventually, Sean [name] urine grew out Enterococcus which was pansensitive.  His antibiotics were tailored to the point where he is going to receive a  full 14-day course of ciprofloxacin 500 mg b.i.d.  On the date of discharge,  he has eight more days of Cipro left.  He defervesced after three days and  has not had a fever since and has slowly returned to his baseline health.   As far as his acute renal insufficiency, his creatinine improved from 2.5 on  admission to 1.3 on discharge with aggressive IV fluid hydration.   For his bilateral hydronephroses, we will leave this up to Dr. Etta Grandchild and we  will recommend that the patient follow up with Dr. Etta Grandchild as an outpatient.  He probably will need more imaging studies over the course of time.   On admission, his hypertension was very well-controlled.  He did not need  his Altace but throughout his hospital course, Altace and then Toprol-XL  have been added and his blood pressures have been fairly well-controlled.   For his hyperlipidemia, his Lipitor was initially on hold while he was  having nausea and vomiting.  After his health returned, he was replaced on  his Lipitor.  For his pain and nausea, he was placed on symptomatic control.   Sean [name] developed a very bad sore throat and felt like his throat may be  closing.  A lateral neck film was obtained which was negative.  Magic  Mouthwash seemed to help him dramatically, more than anything else.  For a profound tachycardia, CK, troponins and EKGs have been obtained.  Unfortunately, his  troponin I trended up with slight increases in his CK  levels.  This was initially partially explained by the fact that he had  acute renal insufficiency, but as the numbers continued to go  up, the idea  that he may have underlying coronary artery disease became more and more  prominent.  EKGs showed sinus tachycardia, Q-T prolongation, nondiagnostic  inferior Q waves, J point elevation in leads V1 and V2.  Dr. Dietrich Pates from  cardiology was consulted and felt that Sean [name] appears to be improving with  his antibiotics and fluid.  His creatinine has fallen substantially.  His  temperature is much lower today on the 30th than it had been over the past  two days.  He has no symptoms to suggest myocardial ischemia or infarction.  Throat discomfort appears to be of pharyngeal origin.  His troponin is  relatively high, considering the degree of renal insufficiency.  With  multiple cardiovascular risk factors and minor EKG abnormalities, evaluation  is certainly warranted.  We will start with an echocardiogram to rule out  previous inferior myocardial infarction.  If that test is normal, we will  plan for a Cardiolite study, either on an inpatient or outpatient basis.  The 2-D echocardiogram was obtained and there were no wall motion  abnormalities.  It was elected to obtain an outpatient Cardiolite study;  this will be arranged through my office in about two weeks' time.   On September 22, 2003, Sean [name] was in relatively stable condition but his  potassium was low and he was still on IV antibiotics.  His IV antibiotics  were switched over to oral and it was felt best to watch him for the next 24  hours and replace his potassium and follow up on all of his inpatient  studies.  He also asked me to get nutrition teaching.  The nutritionist came  by and discussed low-oxalate and -purine and low-fat, heart-healthy diet.  The patient is very motivated with lots of questions and they recommended   outpatient referral if the patient needs further assistance.   On September 23, 2003, Sean [name] was seen on morning rounds.  He was eating,  drinking, walking, had no chest pain, no shortness of breath and no other  current medical issues.  He has been afebrile for greater than 24 hours on  oral antibiotics.  His BUN and creatinine are 13 and 1.3, respectively.  His  potassium is now up to 3.4 and his exam is essentially unchanged and he is  ready for discharge in stable condition.   AFTER-CARE FOLLOWUP INSTRUCTIONS:  He will follow up with me in two weeks.  We will set up a stress test at that date if cardiology has not called him  earlier.  He is instructed to call Dr. Etta Grandchild for an outpatient appointment  in one to two weeks.   AFTER-CARE ACTIVITY:  Slowly resume his baseline activity.   AFTER-CARE FOLLOWUP ON DIET:  He is to follow the dietary instructions as  listed above.                                               Gwen Pounds, M.D.    JMR/MEDQ  D:  09/23/2003  T:  09/23/2003  Job:  [id number]   cc:   [place] Bing, M.D.   Claudette Laws, M.D.  [address]  [place]  [place] [address]  Fax: [phone]

## 2011-04-08 NOTE — Cardiovascular Report (Signed)
NAME:  Sean [name], Sean [name]                           ACCOUNT NO.:  [id number]   MEDICAL RECORD NO.:  [id number]                   PATIENT TYPE:  OIB   LOCATION:  6501                                 FACILITY:  [facility]   PHYSICIAN:  Rollene Rotunda, M.D.                DATE OF BIRTH:  May 07, 1946   DATE OF PROCEDURE:  12/18/2003  DATE OF DISCHARGE:  12/18/2003                              CARDIAC CATHETERIZATION   PRIMARY CARE PHYSICIAN:  Gwen Pounds, M.D.   PROCEDURE:  Left heart catheterization/coronary arteriography.   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   PROCEDURE NOTE:  The left heart catheterization was performed via the right  femoral artery.  The artery was cannulated using anterior wall function.  A  #6-French arterial sheath was inserted via modified Seldinger technique.  Pre-form Judkins' and a pigtail catheter were utilized.  The patient  tolerated the procedure well and left the lab in stable condition.   RESULTS:  1. HEMODYNAMICS:  LV 128/16, AO 132/76.  2. CORONARIES:  The left main had multiple discreet 25% lesions.  The LAD     had long 30-40% stenoses proximally extending into a large first     diagonal.  There was apical 30% stenosis.  The large first diagonal had     long ostial and proximal 95% stenosis with some mild post-stenotic     dilatation.  The circumflex had a 95% stenosis in the ostium of a large     first obtuse marginal.  The vessel was then occluded after the first     obtuse marginal.  Two probably moderate to large size obtuse marginals     were seen to fill the collateral flow from the LAD to the circumflex.     There was also some right to circumflex collateralization as well.  The     right coronary artery was dominant.  It had a proximal 25% stenosis     followed by a long proximal 30% stenosis and  diffuse luminal     irregularities.  3. LEFT VENTRICULOGRAM:  The left ventriculogram was obtained in the RAO     projection.  The EF was 50% with mild antero hypokinesis.   CONCLUSION:  Severe two vessel coronary artery disease.   PLAN:  I have reviewed the films extensively and will discuss this with  colleagues.  We could opt for medical management alone.  However, we could  also consider percutaneous revascularization of the diagonal and circumflex.  At this time, I am leaning against a third option of cardiac  catheterization, but will get a consensus  opinion by discussing this with multiple other cardiologists.  I have  discussed this at length with the patient and his family.  I will increase  his  Lipitor to 80 mg and increase his Toprol to 50 mg to try to begin to  maximize his medical management.  I have reviewed the films with Dr. Timothy Lasso.                                               Rollene Rotunda, M.D.    JH/MEDQ  D:  12/18/2003  T:  12/19/2003  Job:  [id number]   cc:   Gwen Pounds, M.D.  [address]  [place]  [place] [address]  Fax: [phone]

## 2011-04-08 NOTE — Op Note (Signed)
NAME:  Sean [name], Sean [name]                           ACCOUNT NO.:  [id number]   MEDICAL RECORD NO.:  [id number]                   PATIENT TYPE:  AMB   LOCATION:  NESC                                 FACILITY:  [facility]   PHYSICIAN:  Claudette Laws, M.D.               DATE OF BIRTH:  [birth year]   DATE OF PROCEDURE:  09/04/2003  DATE OF DISCHARGE:                                 OPERATIVE REPORT   PREOPERATIVE DIAGNOSES:  1. Distal right ureteral calculus with renal colic.  2. History of left renal stones.  3. Renal cystic disease.  4. Renal colic.   POSTOPERATIVE DIAGNOSES:  1. Distal right ureteral calculus with renal colic.  2. History of left renal stones.  3. Renal cystic disease.  4. Renal colic.   OPERATION:  Cystoscopy, right retrograde pyelogram, rigid ureteroscopy,  distal right ureter and stone basket right ureteral stone and insertion of a  6 French 26 cm double J stent.   SURGEON:  Claudette Laws, M.D.   HISTORY:  This is a 65 year old man who presented about two weeks ago with  right sided colicky pain. He went to the [facility] Emergency Room and a CT  scan confirmed about a 3 x 5 mm stone in the distal right ureter. Since then  he has had intermittent colic with no progression of the stone, no fever or  chills. He called me two days ago requesting that we go ahead with stone  extraction, I explained the procedure to him including complications such as  postop inability to retrieve the stone or break it up with the holmium laser  inadvertent perforation of the ureter, postop bleeding, infection, possible  retention or ureteral stricture. He understands and agrees to the proposed  surgery.   DESCRIPTION OF PROCEDURE:  The patient was prepped and draped in the dorsal  lithotomy position under LMA anesthesia. Cystoscopy was performed with the  22 French rigid cystoscope, he had a normal anterior urethra, small  prostate, some anterior notching, elevation of the  posterior lip, rather  short prostatic urethra. The bladder itself looked normal, +1 trabeculation  but no tumors, no calculi. There was some edema of the distal right ureteral  orifice.   Initially I passed up a 0.038 Glidewire through a 6 [place] open ended  ureteral catheter and using fluoroscopic control this was positioned in the  kidney. Retrograde studies were then performed showing a right  hydronephrosis. He had some tortuosity of the proximal right ureter. I then  exchanged out the Glidewire with a 0.038 stiffer wire and this was  positioned again in the kidney using fluoroscopic control. I then backed out  the cystoscope and passed up a 6.5 [place] short rigid ureteroscope under  direct vision. We were able to intubate the distal ureter. Just distal to  the stone was some edema and a mucosal flap probably from irritation from  the stone. Once we got by this area then we could see the stone. It was  about 5 x 3 mm. I passed up a nitinol basket and we did engage the stone;  however, when we pulled out the ureteroscope the stone was not in the  basket. I went back up with the ureteroscope 2 or 3 passes but never did see  the stone in the distal ureter. We could not get the scope back beyond the  iliac vessels  conceivably either we flushed out the stone or pushed back up  into the proximal ureter. In any event, I thought it would be best at this  point just to put up a double J stent. A guidewire was back loaded over the  cystoscope and under direct vision the 626 cm double J stent was passed, it  was positioned in the right renal pelvis but the distal end was curled up in  the bladder. The bladder was emptied, all instruments were removed and the  patient was taken back to the recovery room in satisfactory condition.   The plan now is to leave the stent in for about a week or so and then remove  it anticipating that if the stone is still in the ureter he should be able  to pass it  spontaneously.                                               Claudette Laws, M.D.    RFS/MEDQ  D:  09/04/2003  T:  09/04/2003  Job:  [id number]

## 2011-04-08 NOTE — Cardiovascular Report (Signed)
NAME:  Sean [name], Sean [name]                           ACCOUNT NO.:  [id number]   MEDICAL RECORD NO.:  [id number]                   PATIENT TYPE:  OIB   LOCATION:  6501                                 FACILITY:  [facility]   PHYSICIAN:  Rollene Rotunda, M.D.                DATE OF BIRTH:  02/20/1946   DATE OF PROCEDURE:  12/18/2003  DATE OF DISCHARGE:  12/18/2003                              CARDIAC CATHETERIZATION   Dictation ends at this point.                                               Rollene Rotunda, M.D.    JH/MEDQ  D:  12/18/2003  T:  12/19/2003  Job:  [id number]

## 2011-04-08 NOTE — Assessment & Plan Note (Signed)
[facility]                            CARDIOLOGY OFFICE NOTE   ATILLA, ZOLLNER                        MRN:          [id number]  DATE:01/15/2007                            DOB:          1946-02-15    Mr. Sidell is a very pleasant gentleman who has a history of coronary  disease.  Since I last saw him, he denies any dyspnea, chest pain,  palpitations, or syncope.   MEDICATIONS:  1. Aspirin 325 mg p.o. daily.  2. Allopurinol 100 mg p.o. daily.  3. Toprol 50 mg p.o. daily.  4. Lipitor 80 mg p.o. daily.  5. Altace 10 mg p.o. daily.  6 . Crescendo weight loss study.  1. Florocid.   PHYSICAL EXAMINATION:  VITAL SIGNS: Blood pressure 149/94, pulse 83.  NECK:  Supple.  CHEST: Clear.  CARDIOVASCULAR:  Regular rhythm.  EXTREMITIES: Show no edema.   Electrocardiogram shows a sinus rhythm.  A prior inferior infarct cannot  be excluded.   DIAGNOSES:  1. Coronary artery disease.  2. Hypertension.  3. Hyperlipidemia.   PLAN:  Mr. Koudelka is doing well from a symptomatic standpoint.  His last  Myoview in July 2006 showed mixed ischemia and scarring in the inferior  wall at the base. We will plan to repeat that to make sure that it is  unchanged.  I have stressed the importance of exercise and diet.  Note  that he does not smoke.  We will have his most recent lipids forwarded  to use from Dr. Ferd Hibbs office.  His blood pressure is mildly elevated  today, and I will increase his Toprol to 75 mg p.o. daily.  He will see  [place] back in 1 year.     Madolyn Frieze Jens Som, MD, [facility]  Electronically Signed    BSC/MedQ  DD: 01/15/2007  DT: 01/15/2007  Job #: [id number]   cc:   Gwen Pounds, MD

## 2011-05-05 ENCOUNTER — Telehealth: Payer: Self-pay | Admitting: Cardiology

## 2011-05-05 DIAGNOSIS — I2510 Atherosclerotic heart disease of native coronary artery without angina pectoris: Secondary | ICD-10-CM

## 2011-05-05 NOTE — Telephone Encounter (Signed)
Spoke with pt, per dr Ludwig Clarks last office lexiscan myoview ordered. Instructions mailed to pt Sean [name]

## 2011-05-05 NOTE — Telephone Encounter (Signed)
Per pt calling want to be set up for stress test.

## 2011-05-16 ENCOUNTER — Other Ambulatory Visit (HOSPITAL_COMMUNITY): Payer: PRIVATE HEALTH INSURANCE | Admitting: Radiology

## 2011-05-16 ENCOUNTER — Ambulatory Visit (HOSPITAL_COMMUNITY): Payer: PRIVATE HEALTH INSURANCE | Attending: Cardiology | Admitting: Radiology

## 2011-05-16 VITALS — Ht 71.0 in | Wt 286.0 lb

## 2011-05-16 DIAGNOSIS — I4949 Other premature depolarization: Secondary | ICD-10-CM

## 2011-05-16 DIAGNOSIS — I2510 Atherosclerotic heart disease of native coronary artery without angina pectoris: Secondary | ICD-10-CM | POA: Insufficient documentation

## 2011-05-16 MED ORDER — REGADENOSON 0.4 MG/5ML IV SOLN
0.4000 mg | Freq: Once | INTRAVENOUS | Status: AC
  Administered 2011-05-16: 0.4 mg via INTRAVENOUS

## 2011-05-16 MED ORDER — TECHNETIUM TC 99M TETROFOSMIN IV KIT
33.0000 | PACK | Freq: Once | INTRAVENOUS | Status: AC | PRN
  Administered 2011-05-16: 33 via INTRAVENOUS

## 2011-05-16 MED ORDER — TECHNETIUM TC 99M TETROFOSMIN IV KIT
11.0000 | PACK | Freq: Once | INTRAVENOUS | Status: AC | PRN
  Administered 2011-05-16: 11 via INTRAVENOUS

## 2011-05-16 NOTE — Progress Notes (Signed)
[facility] SITE 3 NUCLEAR MED [address] [place] [address] [phone]  Cardiology Nuclear Med Study  Sean [name] is a 65 y.o. male [id number] 08/10/1946   Nuclear Med Background Indication for Stress Test:  Evaluation for Ischemia History: '04 Echo;mild PR, '05 Heart Catheterization: 2v DZ EF 50% and '09 Myocardial Perfusion Study EF 54% Cardiac Risk Factors: Hypertension and Lipids  Symptoms:  Palpitations   Nuclear Pre-Procedure Caffeine/Decaff Intake:  None NPO After: 7:00pm   Lungs:  clear IV 0.9% NS with Angio Cath:  20g  IV Site: R Antecubital  IV Started by:  Irean Hong, RN  Chest Size (in):  52 Cup Size: n/a  Height: 5\' 11"  (1.803 m)  Weight:  286 lb (129.729 kg)  BMI:  Body mass index is 39.89 kg/(m^2). Tech Comments:  Held metoprolol x 24 hrs    Nuclear Med Study 1 or 2 day study: 1 day  Stress Test Type:  Eugenie Birks  Reading MD: Dietrich Pates, MD  Order Authorizing Provider:  B.Crenshaw  Resting Radionuclide: Technetium 57m Tetrofosmin  Resting Radionuclide Dose: 11.0 mCi   Stress Radionuclide:  Technetium 42m Tetrofosmin  Stress Radionuclide Dose: 33.0 mCi           Stress Protocol Rest HR: 64 Stress HR: 94  Rest BP: 129/89 Stress BP: 137/56  Exercise Time (min): n/a METS: n/a   Predicted Max HR: 156 bpm % Max HR: 60.26 bpm Rate Pressure Product: 57846   Dose of Adenosine (mg):  n/a Dose of Lexiscan: 0.4 mg  Dose of Atropine (mg): n/a Dose of Dobutamine: n/a mcg/kg/min (at max HR)  Stress Test Technologist: Milana Na, EMT-P  Nuclear Technologist:  Domenic Polite, CNMT     Rest Procedure:  Myocardial perfusion imaging was performed at rest 45 minutes following the intravenous administration of Technetium 78m Tetrofosmin. Rest ECG: SR with pvcs  Stress Procedure:  The patient received IV Lexiscan 0.4 mg over 15-seconds.  Technetium 72m Tetrofosmin injected at 30-seconds.  There were no significant changes and rare  pvcs with Lexiscan.  Quantitative spect images were obtained after a 45 minute delay. Stress ECG: No significant change from baseline ECG  QPS Raw Data Images:  Images were motion corrected.  Soft tissue (diaphragm) underlies inferior wall. Stress Images: Mild thinning with decreased counts in the basal lateral/inferolateral wall. Mild apical thinning.  Both best seen only in the horizontal views. Rest Images:  No significant change from the stress images. Subtraction (SDS):  No significant ischemia. Transient Ischemic Dilatation (Normal <1.22):  0.99 Lung/Heart Ratio (Normal <0.45):  0.38  Quantitative Gated Spect Images QGS EDV:  98 ml QGS ESV:  40 ml QGS cine images:  NL LV Function; NL Wall Motion QGS EF: 59%  Impression Exercise Capacity:  Lexiscan with no exercise. BP Response:  Normal blood pressure response. Clinical Symptoms:  No chest pain. ECG Impression:  No significant ST segment change suggestive of ischemia. Comparison with Prior Nuclear Study: Apical ischemia is no longer present. Overall Impression:  Probable normal perfusion and soft tissue attenuation (diaphragm)  No significant ischemia or scar.

## 2011-05-17 NOTE — Progress Notes (Signed)
Nuc med report routed to Dr. Jens Som 05/17/11 Domenic Polite

## 2011-05-18 NOTE — Progress Notes (Signed)
pt aware of results Sean [name]  

## 2011-10-25 ENCOUNTER — Encounter: Payer: Self-pay | Admitting: *Deleted

## 2011-10-25 NOTE — Progress Notes (Signed)
Ippis score seults on 08/12/10= 0,2,11110=6

## 2011-11-01 ENCOUNTER — Emergency Department (HOSPITAL_COMMUNITY)
Admission: EM | Admit: 2011-11-01 | Discharge: 2011-11-01 | Disposition: A | Discharge status: Home / Self Care | Payer: Medicare Other | Attending: Emergency Medicine | Admitting: Emergency Medicine

## 2011-11-01 ENCOUNTER — Encounter: Payer: Self-pay | Admitting: Emergency Medicine

## 2011-11-01 ENCOUNTER — Emergency Department (HOSPITAL_COMMUNITY): Payer: Medicare Other

## 2011-11-01 DIAGNOSIS — S40022A Contusion of left upper arm, initial encounter: Secondary | ICD-10-CM

## 2011-11-01 DIAGNOSIS — I10 Essential (primary) hypertension: Secondary | ICD-10-CM | POA: Insufficient documentation

## 2011-11-01 DIAGNOSIS — S40029A Contusion of unspecified upper arm, initial encounter: Secondary | ICD-10-CM | POA: Insufficient documentation

## 2011-11-01 DIAGNOSIS — M25529 Pain in unspecified elbow: Secondary | ICD-10-CM | POA: Insufficient documentation

## 2011-11-01 DIAGNOSIS — I2510 Atherosclerotic heart disease of native coronary artery without angina pectoris: Secondary | ICD-10-CM | POA: Insufficient documentation

## 2011-11-01 DIAGNOSIS — Z8546 Personal history of malignant neoplasm of prostate: Secondary | ICD-10-CM | POA: Insufficient documentation

## 2011-11-01 DIAGNOSIS — W010XXA Fall on same level from slipping, tripping and stumbling without subsequent striking against object, initial encounter: Secondary | ICD-10-CM | POA: Insufficient documentation

## 2011-11-01 HISTORY — DX: Essential (primary) hypertension: I10

## 2011-11-01 HISTORY — DX: Atherosclerotic heart disease of native coronary artery without angina pectoris: I25.10

## 2011-11-01 IMAGING — CR DG FOREARM 2V*L*
2 series · 2 of 2 positions shown · non-contrast
Comparison: None.

CLINICAL DATA: Fall with pain.

LEFT FOREARM - 2 VIEW

[x forearm ap left]
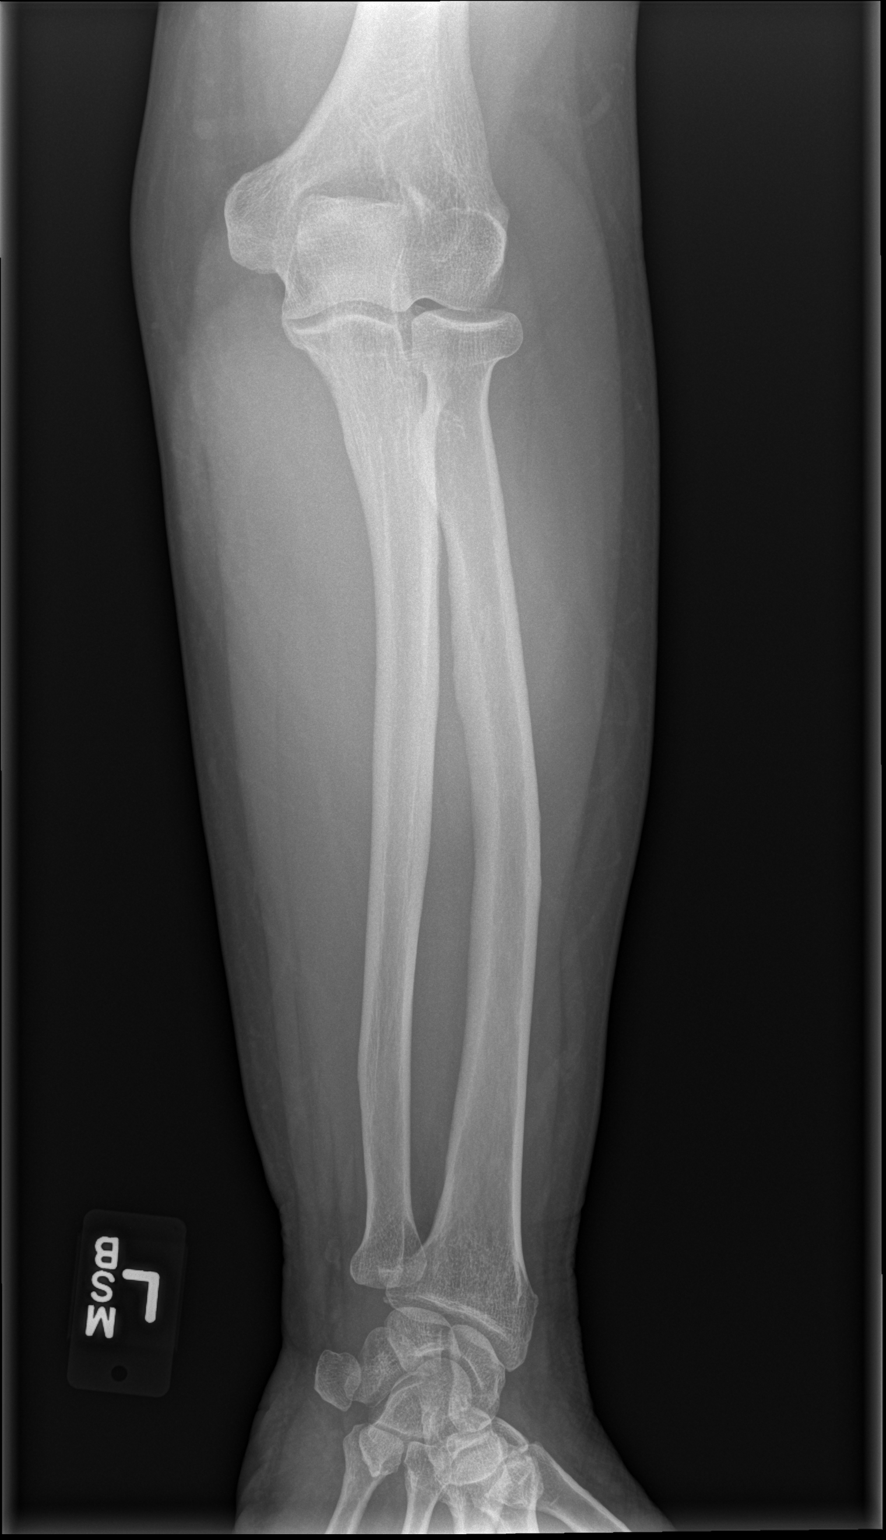

[x forearm lat left]
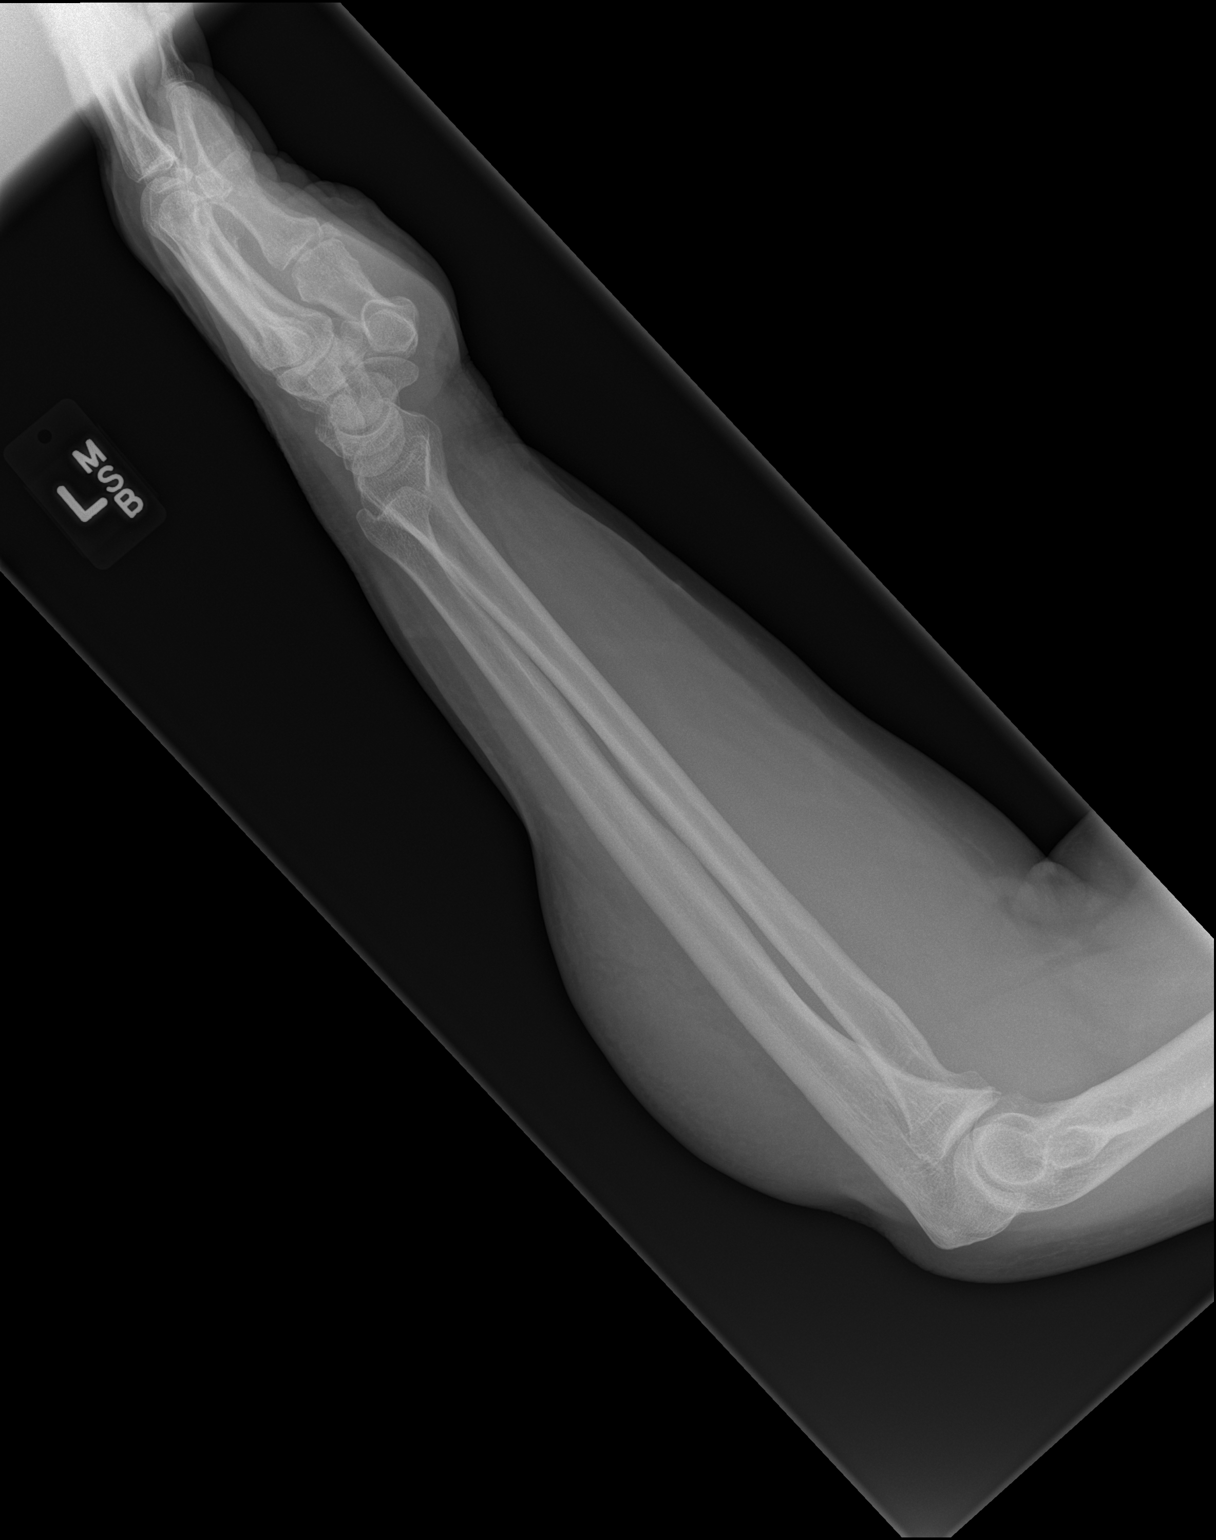

[2 of 2 positions shown; findings below may reference images not displayed]

FINDINGS: Moderate to marked soft tissue swelling about the
proximal ulna posteriorly. No acute fracture or dislocation.  No
elbow joint effusion.
IMPRESSION: Soft tissue swelling superficial the proximal ulna.

## 2011-11-01 MED ORDER — IBUPROFEN 600 MG PO TABS: 600.0000 mg | ORAL_TABLET | Freq: Four times a day (QID) | ORAL | Status: AC | PRN

## 2011-11-01 NOTE — ED Notes (Signed)
Larey Seat this am. Large hematoma to left forearm below elbow

## 2011-11-01 NOTE — ED Provider Notes (Signed)
Medical screening examination/treatment/procedure(s) were performed by non-physician practitioner and as supervising physician I was immediately available for consultation/collaboration.  Fareedah Mahler R. Kashae Carstens, MD 11/01/11 1529 

## 2011-11-01 NOTE — ED Provider Notes (Signed)
History     CSN: [id number] Arrival date & time: 11/01/2011  9:57 AM   First MD Initiated Contact with Patient 11/01/11 1047      Chief Complaint  Patient presents with  . Arm Injury    left arm with 6x4 cm hematoma    (Consider location/radiation/quality/duration/timing/severity/associated sxs/prior treatment) Patient is a 65 y.o. male presenting with arm injury. The history is provided by the patient.  Arm Injury  The incident occurred just prior to arrival. The injury mechanism was a fall. Pertinent negatives include no chest pain, no numbness, no abdominal pain, no nausea, no vomiting and no weakness.   patient states he tripped and fell this morning on some stairs. For swelling and pain in his left forearm. No numbness or weakness. No other injury. He states he does have some mild pain on his left thigh that is minor. He has a swelling in his left dorsal medial forearm. He is on a full dose aspirin a. Miller for this. He did not hit his head. No loss of consciousness. Lites are  Past Medical History  Diagnosis Date  . Cancer     prostate  . Hypertension   . Coronary artery disease   . Kidney stone     Past Surgical History  Procedure Date  . Radioacitve seed     prostate    No family history on file.  History  Substance Use Topics  . Smoking status: Never Smoker   . Smokeless tobacco: Not on file  . Alcohol Use: No      Review of Systems  HENT: Negative for neck stiffness.   Eyes: Positive for pain.  Respiratory: Negative for chest tightness and shortness of breath.   Cardiovascular: Negative for chest pain and leg swelling.  Gastrointestinal: Negative for nausea, vomiting, abdominal pain and diarrhea.  Genitourinary: Negative for flank pain.  Musculoskeletal: Negative for back pain and joint swelling.  Skin: Positive for wound. Negative for rash.  Neurological: Negative for weakness and numbness.  Psychiatric/Behavioral: Negative for behavioral problems.     Allergies  Codeine; Lariam; and Morphine and related  Home Medications   Current Outpatient Rx  Name Route Sig Dispense Refill  . ALLOPURINOL 100 MG PO TABS Oral Take 100 mg by mouth daily.      Marland Kitchen VITAMIN C 1000 MG PO TABS Oral Take 1,000 mg by mouth daily.      . ASPIRIN 325 MG PO TABS Oral Take 325 mg by mouth daily.      . ATORVASTATIN CALCIUM 80 MG PO TABS Oral Take 80 mg by mouth daily.      Marland Kitchen METOPROLOL SUCCINATE ER 50 MG PO TB24 Oral Take 75 mg by mouth daily.      Carma Leaven M PLUS PO TABS Oral Take 1 tablet by mouth daily.      Marland Kitchen RAMIPRIL 10 MG PO CAPS Oral Take 10 mg by mouth daily.      . IBUPROFEN 600 MG PO TABS Oral Take 1 tablet (600 mg total) by mouth every 6 (six) hours as needed for pain. 30 tablet 0    BP 132/90  Pulse 75  Temp(Src) 98.4 F (36.9 C) (Oral)  Resp 18  Ht 5\' 10"  (1.778 m)  Wt 287 lb (130.182 kg)  BMI 41.18 kg/m2  SpO2 97%  Physical Exam  Constitutional: He appears well-developed.  Cardiovascular: Normal rate.   Musculoskeletal: Normal range of motion.       3 x 7cm swelling  with abrasion to left dorsal medial forearm. No  Tenderness to elbow. Range of motion intact. No deformity present swelling. Neurovascularly intact distally. The tenderness is shoulder.   Neurological: He is alert.  Skin: Skin is warm.    ED Course  Procedures (including critical care time)  Labs Reviewed - No data to display Dg Elbow Complete Left  11/01/2011  *RADIOLOGY REPORT*  Clinical Data: Trauma and pain.  LEFT ELBOW - COMPLETE 3+ VIEW  Comparison: Forearm films same date  Findings: Soft tissue swelling superficial the proximal ulna. No acute fracture or dislocation.  No elbow joint effusion.  IMPRESSION: Swelling superficial the ulna without acute osseous finding.  Original Report Authenticated By: Consuello Bossier, M.D.   Dg Forearm Left  11/01/2011  *RADIOLOGY REPORT*  Clinical Data: Fall with pain.  LEFT FOREARM - 2 VIEW  Comparison: None.  Findings:  Moderate to marked soft tissue swelling about the proximal ulna posteriorly. No acute fracture or dislocation.  No elbow joint effusion.  IMPRESSION: Soft tissue swelling superficial the proximal ulna.  Original Report Authenticated By: Consuello Bossier, M.D.     1. Contusion of arm, left       MDM  Hematoma in the forearm after fall negative x-ray. Discharge home        Juliet Rude. Rubin Payor, MD 11/01/11 1531

## 2011-11-01 NOTE — ED Provider Notes (Signed)
History     CSN: [id number] Arrival date & time: 11/01/2011  9:57 AM   First MD Initiated Contact with Patient 11/01/11 1047      Chief Complaint  Patient presents with  . Arm Injury    left arm with 6x4 cm hematoma    (Consider location/radiation/quality/duration/timing/severity/associated sxs/prior treatment) Patient is a 65 y.o. male presenting with arm injury. The history is provided by the patient.  Arm Injury  The incident occurred today. The injury mechanism was a fall and a direct blow. It is unknown if the wounds were self-inflicted. There is an injury to the left elbow.   Pt was walking and fell by tripping over something this morning and now has some significant swelling to his left elbow area. Denies syncope, head injury, LOC, or neck pain, CP, SOB.   Past Medical History  Diagnosis Date  . Cancer     prostate  . Hypertension   . Coronary artery disease   . Kidney stone     Past Surgical History  Procedure Date  . Radioacitve seed     prostate    No family history on file.  History  Substance Use Topics  . Smoking status: Never Smoker   . Smokeless tobacco: Not on file  . Alcohol Use: No      Review of Systems  All other systems reviewed and are negative.    Allergies  Codeine; Lariam; and Morphine and related  Home Medications   Current Outpatient Rx  Name Route Sig Dispense Refill  . ALLOPURINOL 100 MG PO TABS Oral Take 100 mg by mouth daily.      Marland Kitchen VITAMIN C 1000 MG PO TABS Oral Take 1,000 mg by mouth daily.      . ASPIRIN 325 MG PO TABS Oral Take 325 mg by mouth daily.      . ATORVASTATIN CALCIUM 80 MG PO TABS Oral Take 80 mg by mouth daily.      Marland Kitchen METOPROLOL SUCCINATE ER 50 MG PO TB24 Oral Take 75 mg by mouth daily.      Carma Leaven M PLUS PO TABS Oral Take 1 tablet by mouth daily.      Marland Kitchen RAMIPRIL 10 MG PO CAPS Oral Take 10 mg by mouth daily.        BP 132/90  Pulse 75  Temp(Src) 98.4 F (36.9 C) (Oral)  Resp 18  Ht 5\' 10"  (1.778  m)  Wt 287 lb (130.182 kg)  BMI 41.18 kg/m2  SpO2 97%  Physical Exam  Constitutional: He is oriented to person, place, and time. He appears well-developed and well-nourished.  HENT:  Head: Normocephalic and atraumatic.  Eyes: EOM are normal. Pupils are equal, round, and reactive to light.  Neck: Normal range of motion.  Cardiovascular: Normal rate and regular rhythm.   Pulmonary/Chest: Effort normal and breath sounds normal.  Musculoskeletal: Normal range of motion.  Neurological: He is alert and oriented to person, place, and time.  Skin: Skin is warm and dry.    ED Course  Procedures (including critical care time)  Labs Reviewed - No data to display Dg Elbow Complete Left  11/01/2011  *RADIOLOGY REPORT*  Clinical Data: Trauma and pain.  LEFT ELBOW - COMPLETE 3+ VIEW  Comparison: Forearm films same date  Findings: Soft tissue swelling superficial the proximal ulna. No acute fracture or dislocation.  No elbow joint effusion.  IMPRESSION: Swelling superficial the ulna without acute osseous finding.  Original Report Authenticated By: Karn Cassis.  TALBOT, M.D.   Dg Forearm Left  11/01/2011  *RADIOLOGY REPORT*  Clinical Data: Fall with pain.  LEFT FOREARM - 2 VIEW  Comparison: None.  Findings: Moderate to marked soft tissue swelling about the proximal ulna posteriorly. No acute fracture or dislocation.  No elbow joint effusion.  IMPRESSION: Soft tissue swelling superficial the proximal ulna.  Original Report Authenticated By: Consuello Bossier, M.D.     No diagnosis found.    MDM  ACE bandage and ice applied to swollen area. Pt education has been given about treatment and prognosis for large hematomas.          Dorthula Matas, PA 11/01/11 1141  Dorthula Matas, PA 11/01/11 1145

## 2012-01-09 DIAGNOSIS — C61 Malignant neoplasm of prostate: Secondary | ICD-10-CM | POA: Diagnosis not present

## 2012-01-16 DIAGNOSIS — C61 Malignant neoplasm of prostate: Secondary | ICD-10-CM | POA: Diagnosis not present

## 2012-04-06 DIAGNOSIS — R310 Gross hematuria: Secondary | ICD-10-CM | POA: Diagnosis not present

## 2012-04-06 DIAGNOSIS — N35919 Unspecified urethral stricture, male, unspecified site: Secondary | ICD-10-CM | POA: Diagnosis not present

## 2012-04-06 DIAGNOSIS — C61 Malignant neoplasm of prostate: Secondary | ICD-10-CM | POA: Diagnosis not present

## 2012-04-09 ENCOUNTER — Other Ambulatory Visit: Payer: Self-pay | Admitting: Urology

## 2012-04-19 ENCOUNTER — Encounter (HOSPITAL_BASED_OUTPATIENT_CLINIC_OR_DEPARTMENT_OTHER): Payer: Self-pay | Admitting: *Deleted

## 2012-04-19 NOTE — Progress Notes (Signed)
NPO AFTER MN. ARRIVES AT 1000. NEEDS ISTAT AND EKG. WILL TAKE METOPROLOL AM OF SURG W/ SIP OF WATER.

## 2012-04-25 ENCOUNTER — Encounter (HOSPITAL_BASED_OUTPATIENT_CLINIC_OR_DEPARTMENT_OTHER): Payer: Self-pay | Admitting: Anesthesiology

## 2012-04-25 ENCOUNTER — Ambulatory Visit (HOSPITAL_BASED_OUTPATIENT_CLINIC_OR_DEPARTMENT_OTHER)
Admission: RE | Admit: 2012-04-25 | Discharge: 2012-04-25 | Disposition: A | Discharge status: Home / Self Care | Payer: Medicare Other | Source: Ambulatory Visit | Attending: Urology | Admitting: Urology

## 2012-04-25 ENCOUNTER — Ambulatory Visit (HOSPITAL_BASED_OUTPATIENT_CLINIC_OR_DEPARTMENT_OTHER): Payer: Medicare Other | Admitting: Anesthesiology

## 2012-04-25 ENCOUNTER — Encounter (HOSPITAL_BASED_OUTPATIENT_CLINIC_OR_DEPARTMENT_OTHER): Payer: Self-pay | Admitting: *Deleted

## 2012-04-25 ENCOUNTER — Encounter (HOSPITAL_BASED_OUTPATIENT_CLINIC_OR_DEPARTMENT_OTHER)
Admission: RE | Disposition: A | Discharge status: Home / Self Care | Payer: Self-pay | Source: Ambulatory Visit | Attending: Urology

## 2012-04-25 DIAGNOSIS — N35919 Unspecified urethral stricture, male, unspecified site: Secondary | ICD-10-CM | POA: Diagnosis not present

## 2012-04-25 DIAGNOSIS — IMO0002 Reserved for concepts with insufficient information to code with codable children: Secondary | ICD-10-CM

## 2012-04-25 DIAGNOSIS — Z8546 Personal history of malignant neoplasm of prostate: Secondary | ICD-10-CM | POA: Diagnosis not present

## 2012-04-25 DIAGNOSIS — I10 Essential (primary) hypertension: Secondary | ICD-10-CM | POA: Insufficient documentation

## 2012-04-25 DIAGNOSIS — I2510 Atherosclerotic heart disease of native coronary artery without angina pectoris: Secondary | ICD-10-CM | POA: Diagnosis not present

## 2012-04-25 HISTORY — DX: Malignant neoplasm of prostate: C61

## 2012-04-25 HISTORY — PX: CYSTOSCOPY WITH URETHRAL DILATATION: SHX5125

## 2012-04-25 HISTORY — DX: Nocturia: R35.1

## 2012-04-25 HISTORY — DX: Personal history of traumatic brain injury: Z87.820

## 2012-04-25 HISTORY — DX: Hematuria, unspecified: R31.9

## 2012-04-25 HISTORY — DX: Personal history of other diseases of urinary system: Z87.448

## 2012-04-25 HISTORY — DX: Other specified postprocedural states: Z98.890

## 2012-04-25 HISTORY — DX: Personal history of urinary calculi: Z87.442

## 2012-04-25 HISTORY — DX: Unspecified urethral stricture, male, unspecified site: N35.919

## 2012-04-25 HISTORY — DX: Nausea with vomiting, unspecified: R11.2

## 2012-04-25 LAB — POCT I-STAT 4, (NA,K, GLUC, HGB,HCT)
Potassium: 3.6 mEq/L (ref 3.5–5.1)
Sodium: 144 mEq/L (ref 135–145)

## 2012-04-25 SURGERY — CYSTOSCOPY, WITH URETHRAL DILATION
Anesthesia: General | Site: Urethra | Wound class: Clean Contaminated

## 2012-04-25 MED ORDER — ONDANSETRON HCL 4 MG/2ML IJ SOLN
INTRAMUSCULAR | Status: DC | PRN
  Administered 2012-04-25: 4 mg via INTRAVENOUS

## 2012-04-25 MED ORDER — CEFAZOLIN SODIUM-DEXTROSE 2-3 GM-% IV SOLR
2.0000 g | INTRAVENOUS | Status: AC
  Administered 2012-04-25: 2 g via INTRAVENOUS

## 2012-04-25 MED ORDER — FENTANYL CITRATE 0.05 MG/ML IJ SOLN
INTRAMUSCULAR | Status: DC | PRN
  Administered 2012-04-25 (×3): 50 ug via INTRAVENOUS

## 2012-04-25 MED ORDER — STERILE WATER FOR IRRIGATION IR SOLN
Status: DC | PRN
  Administered 2012-04-25: 3000 mL

## 2012-04-25 MED ORDER — PROPOFOL 10 MG/ML IV EMUL
INTRAVENOUS | Status: DC | PRN
  Administered 2012-04-25: 300 mg via INTRAVENOUS
  Administered 2012-04-25 (×2): 20 mg via INTRAVENOUS

## 2012-04-25 MED ORDER — CEPHALEXIN 500 MG PO CAPS: 500.0000 mg | ORAL_CAPSULE | Freq: Two times a day (BID) | ORAL | Status: AC

## 2012-04-25 MED ORDER — KETOROLAC TROMETHAMINE 30 MG/ML IJ SOLN
15.0000 mg | Freq: Once | INTRAMUSCULAR | Status: AC | PRN
Start: 2012-04-25 — End: 2012-04-25
  Administered 2012-04-25: 30 mg via INTRAVENOUS

## 2012-04-25 MED ORDER — LACTATED RINGERS IV SOLN
INTRAVENOUS | Status: DC
  Administered 2012-04-25 (×2): via INTRAVENOUS

## 2012-04-25 MED ORDER — CEFAZOLIN SODIUM 1-5 GM-% IV SOLN: 1.0000 g | INTRAVENOUS | Status: DC

## 2012-04-25 MED ORDER — DEXAMETHASONE SODIUM PHOSPHATE 4 MG/ML IJ SOLN
INTRAMUSCULAR | Status: DC | PRN
  Administered 2012-04-25: 10 mg via INTRAVENOUS

## 2012-04-25 MED ORDER — URIBEL 118 MG PO CAPS: 1.0000 | ORAL_CAPSULE | Freq: Three times a day (TID) | ORAL | Status: DC | PRN

## 2012-04-25 MED ORDER — LIDOCAINE HCL (CARDIAC) 20 MG/ML IV SOLN
INTRAVENOUS | Status: DC | PRN
  Administered 2012-04-25: 100 mg via INTRAVENOUS

## 2012-04-25 MED ORDER — PROMETHAZINE HCL 25 MG/ML IJ SOLN: 6.2500 mg | INTRAMUSCULAR | Status: DC | PRN

## 2012-04-25 MED ORDER — DROPERIDOL 2.5 MG/ML IJ SOLN
INTRAMUSCULAR | Status: DC | PRN
  Administered 2012-04-25: 1.25 mg via INTRAVENOUS

## 2012-04-25 MED ORDER — FENTANYL CITRATE 0.05 MG/ML IJ SOLN: 25.0000 ug | INTRAMUSCULAR | Status: DC | PRN

## 2012-04-25 MED ORDER — IOHEXOL 350 MG/ML SOLN
INTRAVENOUS | Status: DC | PRN
  Administered 2012-04-25: 5 mL

## 2012-04-25 SURGICAL SUPPLY — 29 items
ADAPTER CATH URET PLST 4-6FR (CATHETERS) IMPLANT
ADPR CATH URET STRL DISP 4-6FR (CATHETERS)
BAG DRAIN URO-CYSTO SKYTR STRL (DRAIN) ×2 IMPLANT
BAG DRN ANRFLXCHMBR STRAP LEK (BAG)
BAG DRN UROCATH (DRAIN) ×1
BAG URINE LEG 19OZ MD ST LTX (BAG) IMPLANT
BAG URINE LEG 500ML (DRAIN) ×1 IMPLANT
BALLN NEPHROSTOMY (BALLOONS)
BALLOON NEPHROSTOMY (BALLOONS) IMPLANT
CANISTER SUCT LVC 12 LTR MEDI- (MISCELLANEOUS) ×1 IMPLANT
CATH FOLEY 2WAY SLVR  5CC 18FR (CATHETERS) ×1
CATH FOLEY 2WAY SLVR  5CC 20FR (CATHETERS)
CATH FOLEY 2WAY SLVR  5CC 22FR (CATHETERS)
CATH FOLEY 2WAY SLVR 5CC 18FR (CATHETERS) IMPLANT
CATH FOLEY 2WAY SLVR 5CC 20FR (CATHETERS) IMPLANT
CATH FOLEY 2WAY SLVR 5CC 22FR (CATHETERS) IMPLANT
CATH INTERMIT  6FR 70CM (CATHETERS) IMPLANT
CLOTH BEACON ORANGE TIMEOUT ST (SAFETY) ×2 IMPLANT
DRAPE CAMERA CLOSED 9X96 (DRAPES) ×2 IMPLANT
GLOVE BIO SURGEON STRL SZ8 (GLOVE) ×2 IMPLANT
GLOVE INDICATOR 6.5 STRL GRN (GLOVE) ×2 IMPLANT
GOWN STRL REIN XL XLG (GOWN DISPOSABLE) ×2 IMPLANT
GOWN XL W/COTTON TOWEL STD (GOWNS) ×2 IMPLANT
GUIDEWIRE 0.038 PTFE COATED (WIRE) IMPLANT
GUIDEWIRE ANG ZIPWIRE 038X150 (WIRE) IMPLANT
GUIDEWIRE STR DUAL SENSOR (WIRE) IMPLANT
NS IRRIG 500ML POUR BTL (IV SOLUTION) ×1 IMPLANT
PACK CYSTOSCOPY (CUSTOM PROCEDURE TRAY) ×2 IMPLANT
WATER STERILE IRR 3000ML UROMA (IV SOLUTION) ×2 IMPLANT

## 2012-04-25 NOTE — Discharge Instructions (Signed)
1. You may see some blood in the urine and may have some burning with urination for 48-72 hours. You also may notice that you have to urinate more frequently or urgently after your procedure which is normal.  2. You should call should you develop an inability urinate, fever > 101, persistent nausea and vomiting that prevents you from e. 3. If you have a catheter, you will be taught how to take care of the catheter by the nursing staff prior to discharge from the hospital.  You may periodically feel a strong urge to void with the catheter in place.  This is a bladder spasm and most often can occur when having a bowel movement or moving around. It is typically self-limited and usually will stop after a few minutes.  You may use some Vaseline or Neosporin around the tip of the catheter to reduce friction at the tip of the penis. You may also see some blood in the urine.  A very small amount of blood can make the urine look quite red.  As long as the catheter is draining well, there usually is not a problem.  However, if the catheter is not draining well and is bloody, you should call the office [phone]) to notify us.        4. a prescription for Uribel and cephalexin have been sent to your pharmacy Post Anesthesia Home Care Instructions  Activity: Get plenty of rest for the remainder of the day. A responsible adult should stay with you for 24 hours following the procedure.  For the next 24 hours, DO NOT: -Drive a car -Advertising copywriter -Drink alcoholic beverages -Take any medication unless instructed by your physician -Make any legal decisions or sign important papers.  Meals: Start with liquid foods such as gelatin or soup. Progress to regular foods as tolerated. Avoid greasy, spicy, heavy foods. If nausea and/or vomiting occur, drink only clear liquids until the nausea and/or vomiting subsides. Call your physician if vomiting continues.  Special Instructions/Symptoms: Your throat may feel dry  or sore from the anesthesia or the breathing tube placed in your throat during surgery. If this causes discomfort, gargle with warm salt water. The discomfort should disappear within 24 hours.

## 2012-04-25 NOTE — Transfer of Care (Signed)
Immediate Anesthesia Transfer of Care Note  Patient: Sean [name]  Procedure(s) Performed: Procedure(s) (LRB): CYSTOSCOPY WITH URETHRAL DILATATION (N/A)  Patient Location: Patient transported to PACU with oxygen via face mask at 4 Liters / Min  Anesthesia Type: General  Level of Consciousness: awake and alert   Airway & Oxygen Therapy: Patient Spontanous Breathing and Patient connected to face mask oxygen  Post-op Assessment: Report given to PACU RN and Post -op Vital signs reviewed and stable  Post vital signs: Reviewed and stable  Dentition: Teeth and oropharynx remain in pre-op condition  Complications: No apparent anesthesia complications

## 2012-04-25 NOTE — Interval H&P Note (Signed)
History and Physical Interval Note:  04/25/2012 10:46 AM  Sean [name]  has presented today for surgery, with the diagnosis of URETHRAL STRICTURE  The various methods of treatment have been discussed with the patient and family. After consideration of risks, benefits and other options for treatment, the patient has consented to  Procedure(s) (LRB): CYSTOSCOPY WITH URETHRAL DILATATION (N/A) as a surgical intervention .  The patients' history has been reviewed, patient examined, no change in status, stable for surgery.  I have reviewed the patients' chart and labs.  Questions were answered to the patient's satisfaction.     Chelsea Aus

## 2012-04-25 NOTE — Anesthesia Procedure Notes (Signed)
Procedure Name: LMA Insertion Date/Time: 04/25/2012 10:52 AM Performed by: Fran Lowes Pre-anesthesia Checklist: Patient identified, Emergency Drugs available, Suction available and Patient being monitored Patient Re-evaluated:Patient Re-evaluated prior to inductionOxygen Delivery Method: Circle System Utilized Preoxygenation: Pre-oxygenation with 100% oxygen Intubation Type: IV induction Ventilation: Mask ventilation without difficulty LMA: LMA with gastric port inserted LMA Size: 5.0 Number of attempts: 1 Placement Confirmation: positive ETCO2 Tube secured with: Tape Dental Injury: Teeth and Oropharynx as per pre-operative assessment

## 2012-04-25 NOTE — Op Note (Signed)
Preoperative diagnosis: History of prostate cancer, status post radiotherapy, urethral stricture  Postoperative diagnosis: Same  Principal procedure: Cystoscopy, balloon dilatation of urethral stricture  Surgeon: Yareth Kearse  Anesthesia: Gen. with LMA  Complications: None  Drains: 18 French Foley  Indications: 66 year old male status post brachii therapy for adenocarcinoma prostate. He was recently seen in the office with gross hematuria and a slowed stream. Cystoscopy revealed a very tight bulbous urethral stricture. He presents at this time for cystoscopy and balloon dilatation. Risks and complications of the procedure have been discussed with the patient. He understands these and desires to proceed  Description of procedure: The patient was properly identified and received preoperative IV antibiotics in the holding area. He was taken to the operating room where general anesthetic was administered with the LMA. He was placed in the dorsolithotomy position. Genitalia and perineum were prepped and draped. Timeout was then performed. A 22 French panendoscope was then advanced directly through his urethra. The anterior urethra was normal. At the bulbous urethra, there was a very tight stricture which was impassable. A sensor-tip guidewire was advanced through this and under fluoroscopic guidance was seen to curl in the bladder. The scope was removed and then a 10 cm 15 mm wide balloon was placed, and using the pressure syringe, inflated to 18 atmospheres of pressure. This was left inflated for approximately 3 minutes. Following this, the balloon was deflated, the guidewire was removed with the balloon, and cystoscopy was performed. There was a wide-open urethra at this point. The prostatic urethra was normal. There were no lesions seen. The bladder was entered and inspected circumferentially. There was minimal bolus edema at the bladder neck. No specific lesions were seen. Both ureteral orifices were  normal in configuration and location. There was moderate trabeculation throughout the bladder without mucosal lesions. At this point the scope was removed, the bladder drained with an 70 French Foley catheter which was left indwelling and hooked to a leg bag.  The patient was awakened and taken to the PACU in stable condition, having tolerated the procedure well.

## 2012-04-25 NOTE — Anesthesia Preprocedure Evaluation (Addendum)
Anesthesia Evaluation  Patient identified by MRN, date of birth, ID band Patient awake    Reviewed: Allergy & Precautions, H&P , NPO status , Patient's Chart, lab work & pertinent test results  Airway Mallampati: II TM Distance: <3 FB Neck ROM: Full    Dental No notable dental hx.    Pulmonary neg pulmonary ROS,  breath sounds clear to auscultation  Pulmonary exam normal       Cardiovascular hypertension, Pt. on medications + CAD Rhythm:Regular Rate:Normal  EF 50%   Neuro/Psych negative neurological ROS  negative psych ROS   GI/Hepatic negative GI ROS, Neg liver ROS,   Endo/Other  Morbid obesity  Renal/GU negative Renal ROS  negative genitourinary   Musculoskeletal negative musculoskeletal ROS (+)   Abdominal   Peds negative pediatric ROS (+)  Hematology negative hematology ROS (+)   Anesthesia Other Findings   Reproductive/Obstetrics negative OB ROS                          Anesthesia Physical Anesthesia Plan  ASA: III  Anesthesia Plan: General   Post-op Pain Management:    Induction: Intravenous  Airway Management Planned: LMA  Additional Equipment:   Intra-op Plan:   Post-operative Plan:   Informed Consent: I have reviewed the patients History and Physical, chart, labs and discussed the procedure including the risks, benefits and alternatives for the proposed anesthesia with the patient or authorized representative who has indicated his/her understanding and acceptance.   Dental advisory given  Plan Discussed with: CRNA  Anesthesia Plan Comments: (5 LMA used previously)        Anesthesia Quick Evaluation

## 2012-04-25 NOTE — H&P (Signed)
Urology History and Physical Exam  CC: Blood in urine/urethral stricture  HPI: 53 year oldmale with the following history:      Sean [name] has been seen in our office for followup of adenocarcinoma of the prostate. He completed a combination radiotherapy on 11/26/2010. For specifics of his prostate cancer, please see the information below:     He originally presented 05/2010 with an abnormal prostate exam and a PSA of 4.7. On exam, he had a nodular prostate, noted on the right side. Ultrasound and biopsy was performed 07/23/2010. Glandular size was 46.89 cc.5/12 biopsies revealed adenocarcinoma as follows:        right base lateral 3+3 10%    right mid medial   3+4 20%    right apex medial 3+3 20%    left base lateral    3+3 10%    left mid lateral      3+3 10%        right mid lateral and left base medial biopsies revealed atypia.  He was referred to Dr. Margaretmary Bayley at [facility] for consideration of radiotherapy.  Treatment involved 25 external treatments totalling 45 Gy to the prostate and seminal vesicles, with a boost of 110 Gy to the prostate  using I125 seeds on 11/26/2010.  At his followup on 04/06/2012, he presented with gross hematuria and was found to have a significant bulbous urethral stricture.   PMH: Past Medical History  Diagnosis Date  . Hypertension   . Prostate cancer S/P SEED IMPLANTS 11-26-2010    STAGE T2a, GLEASON 3+4  . H/O simple renal cyst   . History of kidney stones   . Coronary artery disease CARDIOLOGIST- DR CRENSHAW  . History of concussion AS  CHILD-- NO RESIDUAL  . Urethral stricture   . PONV (postoperative nausea and vomiting)   . Hematuria   . Nocturia     PSH: Past Surgical History  Procedure Date  . Radioactive seed implants, prostate 11-26-2010  . Right ureteroscopic stone extraction 09-04-2003  . Cardiac catheterization 12-18-2003    SEVERE TWO VESSEL CAD/  FIRST DIAGONAL  95% STENOSIS/ CIRCUMFLEX 95% STENOSIS IN  THE OSTIUM OF LARGE FIRST OM THAT WAS OCCLUDED AFTER THE FIRST MARGINAL/  COLLATERAL FLOW FROM THE LAD TO THE OBTUSE MARGINALS/  EF 50%---- MEDICAL MANAGEMENT  . Transthoracic echocardiogram 09-22-2003    LVSF NORMAL/  MILD TO MODERATE PULMONARY REGURG.  . Cardiovascular stress test 05-16-2011   DR CRENSHAW    PROBABLE NORMAL PERFUSION AND SOFT TISSUE ATTENUATION/ NO SIGNIFICANT ISCHEMIA OR SCAR /  EF  59%    Allergies: Allergies  Allergen Reactions  . Codeine Other (See Comments)    constipation  . Lariam (Mefloquine Hcl) Other (See Comments)    Respiratory problems  . Morphine And Related Nausea And Vomiting    Medications: No prescriptions prior to admission     Social History: History   Social History  . Marital Status: Single    Spouse Name: N/A    Number of Children: N/A  . Years of Education: N/A   Occupational History  . Not on file.   Social History Main Topics  . Smoking status: Never Smoker   . Smokeless tobacco: Never Used  . Alcohol Use: No  . Drug Use: No  . Sexually Active:    Other Topics Concern  . Not on file   Social History Narrative  . No narrative on file    Family History: History reviewed. No  pertinent family history.  Review of Systems: Positive: Gross hematuria;slow stream Negative:   A further 10 point review of systems was negative except what is listed in the HPI.  Physical Exam: @VITALS2 @ General: No acute distress.  Awake. Head:  Normocephalic.  Atraumatic. ENT:  EOMI.  Mucous membranes moist Neck:  Supple.  No lymphadenopathy. CV:  S1 present. S2 present. Regular rate. Pulmonary: Equal effort bilaterally.  Clear to auscultation bilaterally. Abdomen: Soft. Obese. Skin:  Normal turgor.  No visible rash. Extremity: No gross deformity of bilateral upper extremities.  No gross deformity of    bilateral lower extremities. Neurologic: Alert. Appropriate mood.   Studies:  No results found for this basename:  HGB:2,WBC:2,PLT:2 in the last 72 hours  No results found for this basename: NA:2,K:2,CL:2,CO2:2,BUN:2,CREATININE:2,CALCIUM:2,MAGNESIUM:2,GFRNONAA:2,GFRAA:2 in the last 72 hours   No results found for this basename: PT:2,INR:2,APTT:2 in the last 72 hours   No components found with this basename: ABG:2    Assessment:    1. Adenocarcinoma prostate. Clinical stage TII A., Gleason 3+4. 5/12 biopsies were positive, 4 revealed Gleason 3+3 pattern, one involved a Gleason 3+4 pattern. Biopsies from both sides of the prostate were positive. He completed combination radiotherapy on 11/26/2010. Treatment was given by Dr. Margaretmary Dys.  His first PSA, checked 06/05/2010, was 0.4. Recheck on 09/06/2011 00 0.44 and 0.42 on 01/09/2012.    2 gross hematuria, possibly related to radiation, or perhaps urethral stricture  3. Significant bulbar urethral stricture   Plan: Anesthetic cystoscopy, balloon dilation of urethral stricture

## 2012-04-25 NOTE — Anesthesia Postprocedure Evaluation (Signed)
  Anesthesia Post-op Note  Patient: Sean [name]  Procedure(s) Performed: Procedure(s) (LRB): CYSTOSCOPY WITH URETHRAL DILATATION (N/A)  Patient Location: PACU  Anesthesia Type: General  Level of Consciousness: awake and alert   Airway and Oxygen Therapy: Patient Spontanous Breathing  Post-op Pain: mild  Post-op Assessment: Post-op Vital signs reviewed, Patient's Cardiovascular Status Stable, Respiratory Function Stable, Patent Airway and No signs of Nausea or vomiting  Post-op Vital Signs: stable  Complications: No apparent anesthesia complications

## 2012-04-30 ENCOUNTER — Encounter (HOSPITAL_BASED_OUTPATIENT_CLINIC_OR_DEPARTMENT_OTHER): Payer: Self-pay | Admitting: Urology

## 2012-05-07 DIAGNOSIS — I10 Essential (primary) hypertension: Secondary | ICD-10-CM | POA: Diagnosis not present

## 2012-05-07 DIAGNOSIS — C61 Malignant neoplasm of prostate: Secondary | ICD-10-CM | POA: Diagnosis not present

## 2012-05-07 DIAGNOSIS — R82998 Other abnormal findings in urine: Secondary | ICD-10-CM | POA: Diagnosis not present

## 2012-05-07 DIAGNOSIS — E785 Hyperlipidemia, unspecified: Secondary | ICD-10-CM | POA: Diagnosis not present

## 2012-05-10 DIAGNOSIS — M109 Gout, unspecified: Secondary | ICD-10-CM | POA: Diagnosis not present

## 2012-05-14 DIAGNOSIS — C61 Malignant neoplasm of prostate: Secondary | ICD-10-CM | POA: Diagnosis not present

## 2012-05-14 DIAGNOSIS — Z0000 Encounter for general adult medical examination without abnormal findings: Secondary | ICD-10-CM | POA: Diagnosis not present

## 2012-05-14 DIAGNOSIS — I2510 Atherosclerotic heart disease of native coronary artery without angina pectoris: Secondary | ICD-10-CM | POA: Diagnosis not present

## 2012-05-14 DIAGNOSIS — I428 Other cardiomyopathies: Secondary | ICD-10-CM | POA: Diagnosis not present

## 2012-05-17 DIAGNOSIS — Z1212 Encounter for screening for malignant neoplasm of rectum: Secondary | ICD-10-CM | POA: Diagnosis not present

## 2012-05-28 DIAGNOSIS — N35919 Unspecified urethral stricture, male, unspecified site: Secondary | ICD-10-CM | POA: Diagnosis not present

## 2012-05-28 DIAGNOSIS — C61 Malignant neoplasm of prostate: Secondary | ICD-10-CM | POA: Diagnosis not present

## 2012-05-28 DIAGNOSIS — R310 Gross hematuria: Secondary | ICD-10-CM | POA: Diagnosis not present

## 2012-06-05 DIAGNOSIS — M1710 Unilateral primary osteoarthritis, unspecified knee: Secondary | ICD-10-CM | POA: Diagnosis not present

## 2012-06-05 DIAGNOSIS — M7120 Synovial cyst of popliteal space [Baker], unspecified knee: Secondary | ICD-10-CM | POA: Diagnosis not present

## 2012-08-03 DIAGNOSIS — Z23 Encounter for immunization: Secondary | ICD-10-CM | POA: Diagnosis not present

## 2012-09-21 DIAGNOSIS — C61 Malignant neoplasm of prostate: Secondary | ICD-10-CM | POA: Diagnosis not present

## 2012-10-03 DIAGNOSIS — C61 Malignant neoplasm of prostate: Secondary | ICD-10-CM | POA: Diagnosis not present

## 2012-11-05 DIAGNOSIS — E785 Hyperlipidemia, unspecified: Secondary | ICD-10-CM | POA: Diagnosis not present

## 2012-11-05 DIAGNOSIS — I2510 Atherosclerotic heart disease of native coronary artery without angina pectoris: Secondary | ICD-10-CM | POA: Diagnosis not present

## 2012-11-05 DIAGNOSIS — Z1331 Encounter for screening for depression: Secondary | ICD-10-CM | POA: Diagnosis not present

## 2012-11-05 DIAGNOSIS — I428 Other cardiomyopathies: Secondary | ICD-10-CM | POA: Diagnosis not present

## 2012-11-27 IMAGING — CR DG ELBOW COMPLETE 3+V*L*
4 series · 4 of 4 positions shown · non-contrast
Comparison: Forearm films same date

CLINICAL DATA: Trauma and pain.

LEFT ELBOW - COMPLETE 3+ VIEW

[x elbow ap left]
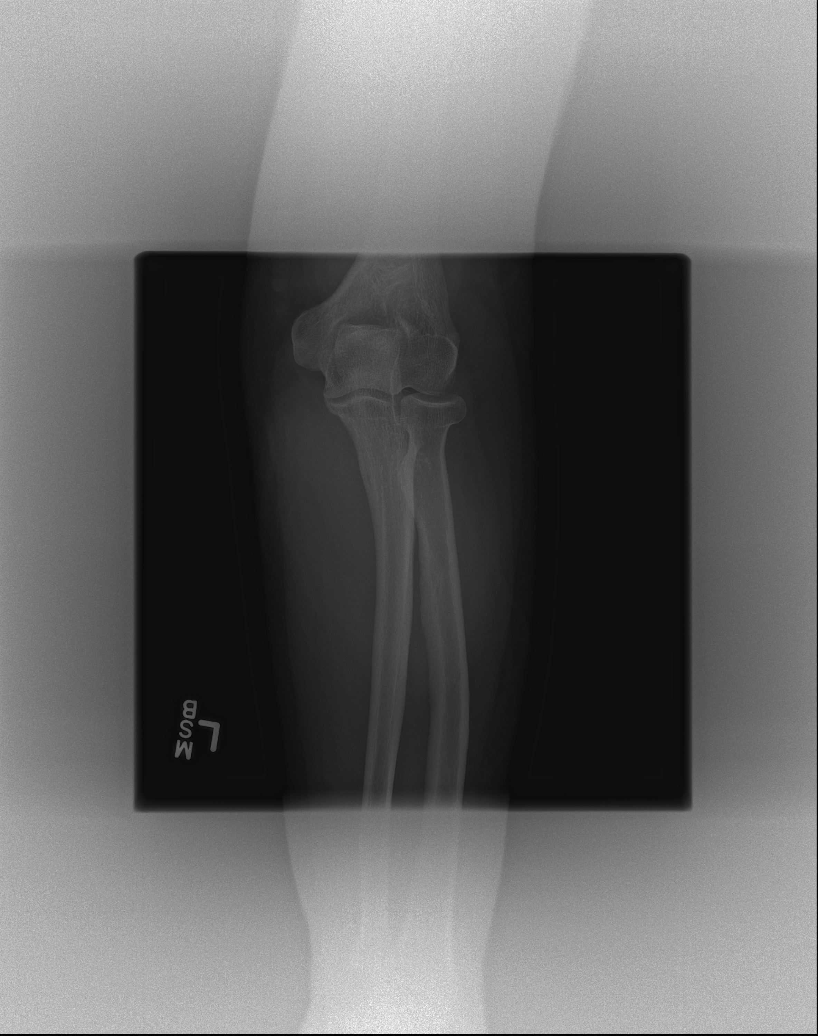

[x elbow lat left]
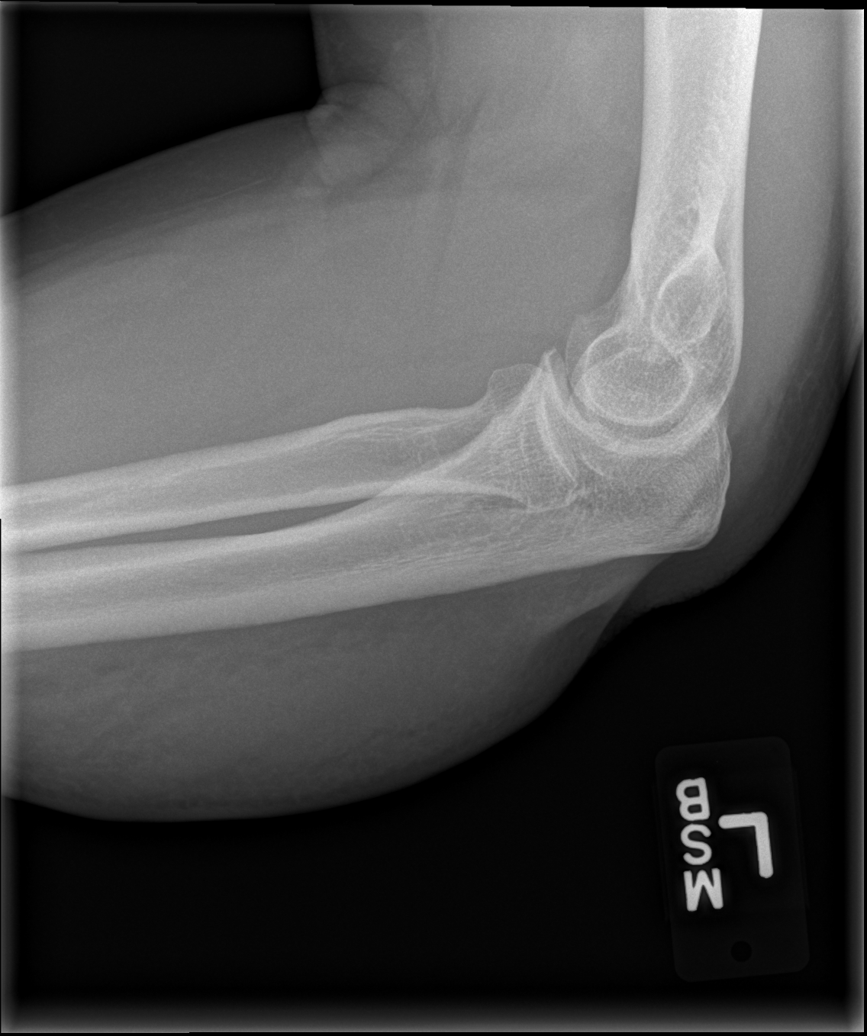

[x elbow obl left (1 of 2)]
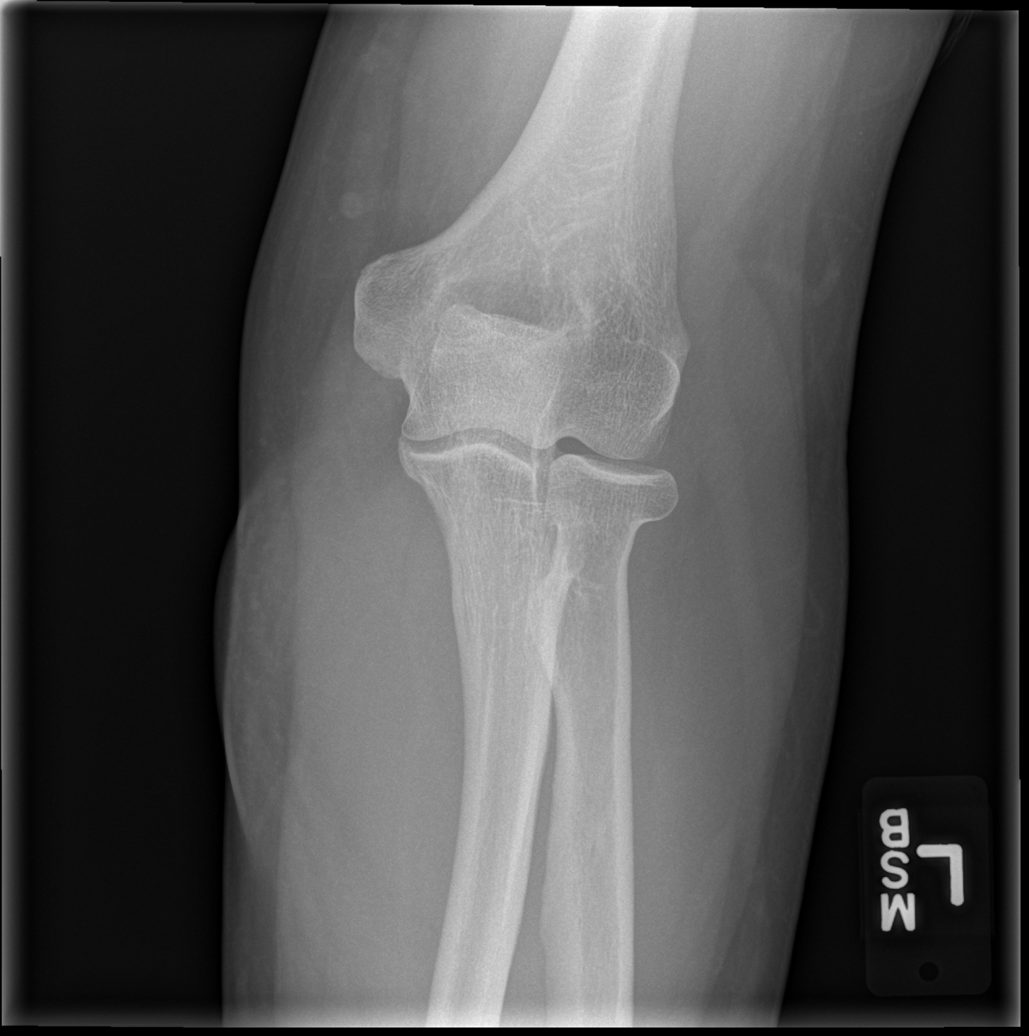

[x elbow obl left (2 of 2)]
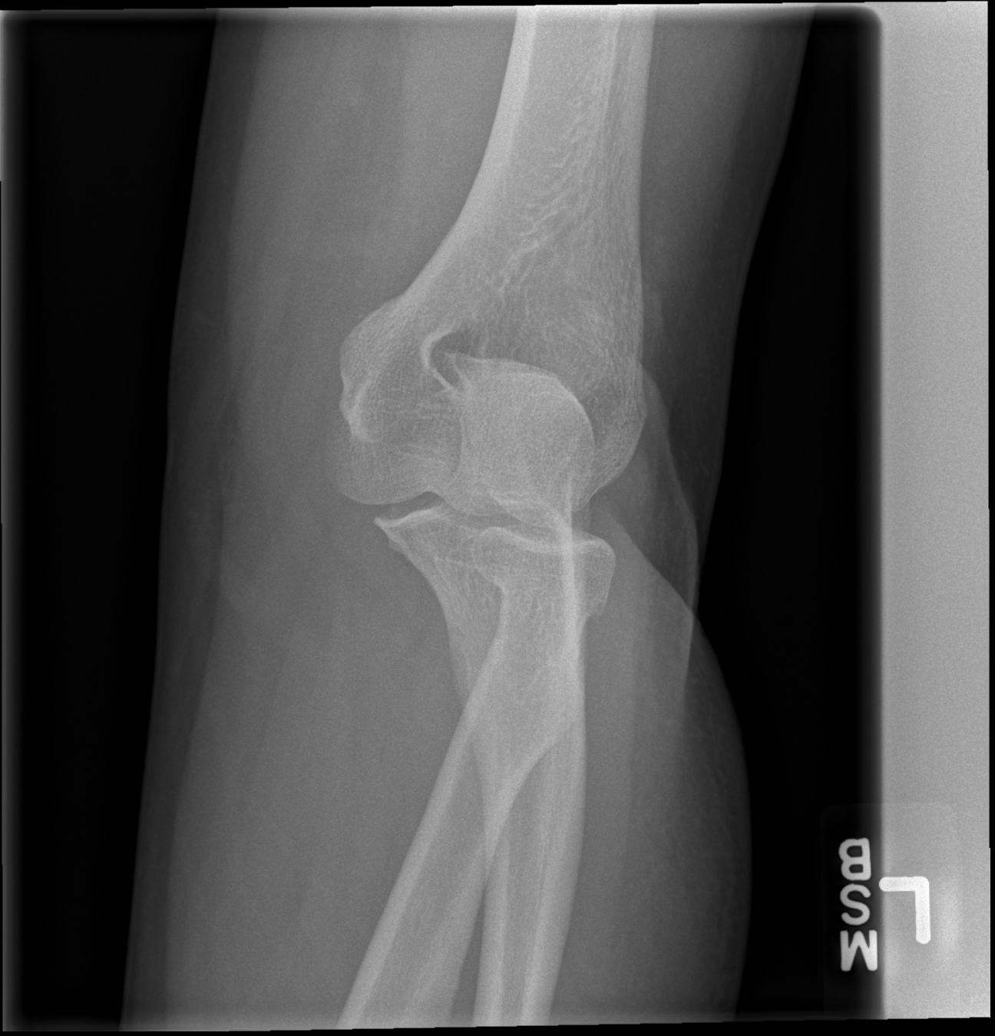

[4 of 4 positions shown; findings below may reference images not displayed]

FINDINGS: Soft tissue swelling superficial the proximal ulna. No
acute fracture or dislocation.  No elbow joint effusion.
IMPRESSION: Swelling superficial the ulna without acute osseous finding.

## 2013-03-13 ENCOUNTER — Encounter: Payer: Self-pay | Admitting: Internal Medicine

## 2013-03-18 DIAGNOSIS — R310 Gross hematuria: Secondary | ICD-10-CM | POA: Diagnosis not present

## 2013-03-25 DIAGNOSIS — C61 Malignant neoplasm of prostate: Secondary | ICD-10-CM | POA: Diagnosis not present

## 2013-04-01 DIAGNOSIS — N35919 Unspecified urethral stricture, male, unspecified site: Secondary | ICD-10-CM | POA: Diagnosis not present

## 2013-04-01 DIAGNOSIS — R310 Gross hematuria: Secondary | ICD-10-CM | POA: Diagnosis not present

## 2013-04-03 ENCOUNTER — Other Ambulatory Visit: Payer: Self-pay | Admitting: Urology

## 2013-04-09 ENCOUNTER — Encounter (HOSPITAL_BASED_OUTPATIENT_CLINIC_OR_DEPARTMENT_OTHER): Payer: Self-pay | Admitting: *Deleted

## 2013-04-09 NOTE — Progress Notes (Signed)
NPO AFTER MN. ARRIVES AT 1030. NEEDS ISTAT AND EKG. WILL TAKE METOPROLOL AM OF SURG W/ SIP OF WATER.

## 2013-04-12 ENCOUNTER — Ambulatory Visit (HOSPITAL_BASED_OUTPATIENT_CLINIC_OR_DEPARTMENT_OTHER)
Admission: RE | Admit: 2013-04-12 | Discharge: 2013-04-12 | Disposition: A | Discharge status: Home / Self Care | Payer: Medicare Other | Source: Ambulatory Visit | Attending: Urology | Admitting: Urology

## 2013-04-12 ENCOUNTER — Encounter (HOSPITAL_BASED_OUTPATIENT_CLINIC_OR_DEPARTMENT_OTHER): Payer: Self-pay | Admitting: Anesthesiology

## 2013-04-12 ENCOUNTER — Ambulatory Visit (HOSPITAL_BASED_OUTPATIENT_CLINIC_OR_DEPARTMENT_OTHER): Payer: Medicare Other | Admitting: Anesthesiology

## 2013-04-12 ENCOUNTER — Encounter (HOSPITAL_BASED_OUTPATIENT_CLINIC_OR_DEPARTMENT_OTHER)
Admission: RE | Disposition: A | Discharge status: Home / Self Care | Payer: Self-pay | Source: Ambulatory Visit | Attending: Urology

## 2013-04-12 ENCOUNTER — Encounter (HOSPITAL_BASED_OUTPATIENT_CLINIC_OR_DEPARTMENT_OTHER): Payer: Self-pay | Admitting: *Deleted

## 2013-04-12 ENCOUNTER — Other Ambulatory Visit: Payer: Self-pay

## 2013-04-12 DIAGNOSIS — R319 Hematuria, unspecified: Secondary | ICD-10-CM | POA: Diagnosis not present

## 2013-04-12 DIAGNOSIS — N35919 Unspecified urethral stricture, male, unspecified site: Secondary | ICD-10-CM | POA: Diagnosis not present

## 2013-04-12 DIAGNOSIS — Z8546 Personal history of malignant neoplasm of prostate: Secondary | ICD-10-CM | POA: Insufficient documentation

## 2013-04-12 DIAGNOSIS — IMO0002 Reserved for concepts with insufficient information to code with codable children: Secondary | ICD-10-CM | POA: Diagnosis not present

## 2013-04-12 DIAGNOSIS — I2510 Atherosclerotic heart disease of native coronary artery without angina pectoris: Secondary | ICD-10-CM | POA: Insufficient documentation

## 2013-04-12 DIAGNOSIS — Z923 Personal history of irradiation: Secondary | ICD-10-CM | POA: Diagnosis not present

## 2013-04-12 DIAGNOSIS — I10 Essential (primary) hypertension: Secondary | ICD-10-CM | POA: Diagnosis not present

## 2013-04-12 HISTORY — PX: CYSTOSCOPY WITH URETHRAL DILATATION: SHX5125

## 2013-04-12 HISTORY — DX: Personal history of malignant neoplasm of prostate: Z85.46

## 2013-04-12 LAB — POCT I-STAT 4, (NA,K, GLUC, HGB,HCT)
Hemoglobin: 15 g/dL (ref 13.0–17.0)
Potassium: 3.8 mEq/L (ref 3.5–5.1)
Sodium: 145 mEq/L (ref 135–145)

## 2013-04-12 SURGERY — CYSTOSCOPY, WITH URETHRAL DILATION
Anesthesia: General | Site: Urethra | Wound class: Clean Contaminated

## 2013-04-12 MED ORDER — ONDANSETRON HCL 4 MG/2ML IJ SOLN
INTRAMUSCULAR | Status: DC | PRN
  Administered 2013-04-12: 4 mg via INTRAVENOUS

## 2013-04-12 MED ORDER — STERILE WATER FOR IRRIGATION IR SOLN
Status: DC | PRN
  Administered 2013-04-12: 3000 mL

## 2013-04-12 MED ORDER — LACTATED RINGERS IV SOLN
INTRAVENOUS | Status: DC
  Administered 2013-04-12: 12:00:00 via INTRAVENOUS
  Filled 2013-04-12: qty 1000

## 2013-04-12 MED ORDER — DEXTROSE 5 % IV SOLN
3.0000 g | INTRAVENOUS | Status: AC
  Administered 2013-04-12: 3 g via INTRAVENOUS
  Filled 2013-04-12: qty 3000

## 2013-04-12 MED ORDER — CEFAZOLIN SODIUM 1-5 GM-% IV SOLN
1.0000 g | INTRAVENOUS | Status: DC
  Filled 2013-04-12: qty 50

## 2013-04-12 MED ORDER — PROPOFOL 10 MG/ML IV BOLUS
INTRAVENOUS | Status: DC | PRN
  Administered 2013-04-12: 300 mg via INTRAVENOUS

## 2013-04-12 MED ORDER — FENTANYL CITRATE 0.05 MG/ML IJ SOLN
25.0000 ug | INTRAMUSCULAR | Status: DC | PRN
  Filled 2013-04-12: qty 1

## 2013-04-12 MED ORDER — MEPERIDINE HCL 25 MG/ML IJ SOLN
6.2500 mg | INTRAMUSCULAR | Status: DC | PRN
  Filled 2013-04-12: qty 1

## 2013-04-12 MED ORDER — PROMETHAZINE HCL 25 MG/ML IJ SOLN
6.2500 mg | INTRAMUSCULAR | Status: DC | PRN
  Filled 2013-04-12: qty 1

## 2013-04-12 MED ORDER — DEXAMETHASONE SODIUM PHOSPHATE 4 MG/ML IJ SOLN
INTRAMUSCULAR | Status: DC | PRN
  Administered 2013-04-12: 8 mg via INTRAVENOUS

## 2013-04-12 MED ORDER — FENTANYL CITRATE 0.05 MG/ML IJ SOLN
INTRAMUSCULAR | Status: DC | PRN
  Administered 2013-04-12: 25 ug via INTRAVENOUS
  Administered 2013-04-12: 50 ug via INTRAVENOUS
  Administered 2013-04-12: 25 ug via INTRAVENOUS

## 2013-04-12 MED ORDER — CEPHALEXIN 250 MG PO CAPS: 500.0000 mg | ORAL_CAPSULE | Freq: Two times a day (BID) | ORAL | Status: DC

## 2013-04-12 MED ORDER — LIDOCAINE HCL (CARDIAC) 20 MG/ML IV SOLN
INTRAVENOUS | Status: DC | PRN
  Administered 2013-04-12: 75 mg via INTRAVENOUS

## 2013-04-12 MED ORDER — IOHEXOL 350 MG/ML SOLN
INTRAVENOUS | Status: DC | PRN
  Administered 2013-04-12: 10 mL

## 2013-04-12 SURGICAL SUPPLY — 28 items
ADAPTER CATH URET PLST 4-6FR (CATHETERS) IMPLANT
ADPR CATH URET STRL DISP 4-6FR (CATHETERS)
BAG DRAIN URO-CYSTO SKYTR STRL (DRAIN) ×2 IMPLANT
BAG DRN ANRFLXCHMBR STRAP LEK (BAG) ×1
BAG DRN UROCATH (DRAIN) ×1
BAG URINE LEG 19OZ MD ST LTX (BAG) ×1 IMPLANT
BAG URINE LEG 500ML (DRAIN) IMPLANT
BALLN NEPHROSTOMY (BALLOONS) ×2
BALLOON NEPHROSTOMY (BALLOONS) IMPLANT
CANISTER SUCT LVC 12 LTR MEDI- (MISCELLANEOUS) ×1 IMPLANT
CATH FOLEY 2WAY SLVR  5CC 18FR (CATHETERS) ×1
CATH FOLEY 2WAY SLVR  5CC 20FR (CATHETERS)
CATH FOLEY 2WAY SLVR  5CC 22FR (CATHETERS)
CATH FOLEY 2WAY SLVR 5CC 18FR (CATHETERS) IMPLANT
CATH FOLEY 2WAY SLVR 5CC 20FR (CATHETERS) IMPLANT
CATH FOLEY 2WAY SLVR 5CC 22FR (CATHETERS) IMPLANT
CATH INTERMIT  6FR 70CM (CATHETERS) IMPLANT
CLOTH BEACON ORANGE TIMEOUT ST (SAFETY) ×2 IMPLANT
DRAPE CAMERA CLOSED 9X96 (DRAPES) ×2 IMPLANT
GLOVE BIO SURGEON STRL SZ8 (GLOVE) ×2 IMPLANT
GOWN STRL REIN XL XLG (GOWN DISPOSABLE) ×4 IMPLANT
GOWN XL W/COTTON TOWEL STD (GOWNS) ×2 IMPLANT
GUIDEWIRE 0.038 PTFE COATED (WIRE) IMPLANT
GUIDEWIRE ANG ZIPWIRE 038X150 (WIRE) IMPLANT
GUIDEWIRE STR DUAL SENSOR (WIRE) ×1 IMPLANT
NS IRRIG 500ML POUR BTL (IV SOLUTION) IMPLANT
PACK CYSTOSCOPY (CUSTOM PROCEDURE TRAY) ×2 IMPLANT
WATER STERILE IRR 3000ML UROMA (IV SOLUTION) ×2 IMPLANT

## 2013-04-12 NOTE — Op Note (Signed)
Preoperative diagnosis: Recurrent urethral stricture  Postoperative diagnosis: Same   Procedure: Cystoscopy, balloon dilation of urethral stricture    Surgeon: Bertram Millard. Ravon Mcilhenny, M.D.   Anesthesia: Gen.   Complications: None  Specimen(s): None  Drain(s): 18 French Foley catheter, the leg bag  Indications: 67 year old male status post radiotherapy for adenocarcinoma prostate. He underwent balloon dilation of urethral stricture just under a year ago for a bulbous urethral stricture. He has not wanted to perform self dilation, and he had recurrence of the stricture which was noted recently on cystoscopy upon evaluation for recurrent lower urinary tract symptoms. He presents at this time for anesthetic cystoscopy and dilation of urethral stricture    Technique and findings: The patient was properly identified in the holding area. He received 3 g of Ancef preoperatively intravenously. He was taken to the operating room where general anesthetic was administered with the LMA. He was placed in the dorsolithotomy position. Genitalia and perineum were prepped and draped. Proper timeout was then performed.  A 22 French panendoscope was advanced under direct vision through his urethra. The bulbous urethral stricture had recurred, and was quite tight. A 0.038 inch sensor-tip guidewire was advanced through this, and fluoroscopy revealed it to be curled within the bladder. The scope was then removed. Over top of the guidewire, a nephrostomy balloon was placed, with the tip in the bladder. I then inflated the balloon to 18 atmospheres of pressure for approximately 3 minutes. The balloon was then deflated, the guidewire and the balloon were removed. Cystoscopy revealed the urethral stricture to be wide open. The bladder was inspected. There was minimal obstruction from the prostate. There were no tumors, trabeculations or foreign bodies within the bladder. Ureteral orifices were normal in configuration and  location. At this point, the scope was removed, an 4 French Foley catheter was placed and hooked to dependent drainage. The patient tolerated procedure well. He was then awakened and taken to the PACU in stable condition.

## 2013-04-12 NOTE — Transfer of Care (Signed)
Immediate Anesthesia Transfer of Care Note  Patient: Sean [name]  Procedure(s) Performed: Procedure(s) (LRB): CYSTOSCOPY WITH URETHRAL DILATATION (N/A)  Patient Location: Patient transported to PACU with oxygen via face mask at 4 Liters / Min  Anesthesia Type: General  Level of Consciousness: awake and alert   Airway & Oxygen Therapy: Patient Spontanous Breathing and Patient connected to face mask oxygen  Post-op Assessment: Report given to PACU RN and Post -op Vital signs reviewed and stable  Post vital signs: Reviewed and stable  Dentition: Teeth and oropharynx remain in pre-op condition  Complications: No apparent anesthesia complications

## 2013-04-12 NOTE — Anesthesia Postprocedure Evaluation (Signed)
  Anesthesia Post-op Note  Patient: Sean [name]  Procedure(s) Performed: Procedure(s) (LRB): CYSTOSCOPY WITH URETHRAL DILATATION (N/A)  Patient Location: PACU  Anesthesia Type: General  Level of Consciousness: awake and alert   Airway and Oxygen Therapy: Patient Spontanous Breathing  Post-op Pain: mild  Post-op Assessment: Post-op Vital signs reviewed, Patient's Cardiovascular Status Stable, Respiratory Function Stable, Patent Airway and No signs of Nausea or vomiting  Last Vitals:  Filed Vitals:   04/12/13 1224  BP: 135/73  Pulse:   Temp: 36.9 C  Resp: 10    Post-op Vital Signs: stable   Complications: No apparent anesthesia complications

## 2013-04-12 NOTE — Progress Notes (Signed)
Patient did not have anyone to stay with him for 24 hours.  Dr.Rose was called and ok'd patient to stay alone.

## 2013-04-12 NOTE — H&P (Signed)
Urology History and Physical Exam  CC: Difficulty urinating  HPI: 67 year old male presents for anesthetic cystoscopy and balloon dilation of urethral stricture. This is recurrent. His history is as follows:   Mr. Sean [name] has a history of prostate cancer. He completed a combination radiotherapy on 11/26/2010.   He was referred to Dr. Margaretmary Bayley at [facility] for consideration of radiotherapy.  Treatment involved 25 external treatments totalling 45 Gy to the prostate and seminal vesicles, with a boost of 110 Gy to the prostate  using I-125 seeds on 11/26/2010.  He has had gross hematuria in the past.  This resolved after a balloon dilation of a urethral stricture in June 2013.   He had recurrence of his hematuria, and recent cysto revealed a recurrent stricture.  PMH: Past Medical History  Diagnosis Date  . Hypertension   . H/O simple renal cyst   . History of kidney stones   . History of concussion AS  CHILD-- NO RESIDUAL  . Urethral stricture   . PONV (postoperative nausea and vomiting)   . Hematuria   . Nocturia   . Coronary artery disease CARDIOLOGIST- DR CRENSHAW  . History of prostate cancer     S/P RADIOACTIVE SEED IMPLANTS AND EXTERNAL RADIATION---  NO RECURRENCE    PSH: Past Surgical History  Procedure Laterality Date  . Radioactive seed implants, prostate  11-26-2010  . Right ureteroscopic stone extraction  09-04-2003  . Cardiac catheterization  12-18-2003    SEVERE TWO VESSEL CAD/  FIRST DIAGONAL  95% STENOSIS/ CIRCUMFLEX 95% STENOSIS IN THE OSTIUM OF LARGE FIRST OM THAT WAS OCCLUDED AFTER THE FIRST MARGINAL/  COLLATERAL FLOW FROM THE LAD TO THE OBTUSE MARGINALS/  EF 50%---- MEDICAL MANAGEMENT  . Transthoracic echocardiogram  09-22-2003    LVSF NORMAL/  MILD TO MODERATE PULMONARY REGURG.  . Cardiovascular stress test  05-16-2011   DR CRENSHAW    PROBABLE NORMAL PERFUSION AND SOFT TISSUE ATTENUATION/ NO SIGNIFICANT ISCHEMIA OR SCAR /  EF  59%  .  Cystoscopy with urethral dilatation  04/25/2012    Procedure: CYSTOSCOPY WITH URETHRAL DILATATION;  Surgeon: Marcine Matar, MD;  Location: [facility];  Service: Urology;  Laterality: N/A;   CYSTOSCOPY, BALLOON DILATION OF URETHRAL STRICTURE    Allergies: Allergies  Allergen Reactions  . Codeine Other (See Comments)    constipation  . Lariam (Mefloquine Hcl) Other (See Comments)    Respiratory problems  . Morphine And Related Nausea And Vomiting    Medications: No prescriptions prior to admission     Social History: History   Social History  . Marital Status: Single    Spouse Name: N/A    Number of Children: N/A  . Years of Education: N/A   Occupational History  . Not on file.   Social History Main Topics  . Smoking status: Never Smoker   . Smokeless tobacco: Never Used  . Alcohol Use: No  . Drug Use: No  . Sexually Active: Not on file   Other Topics Concern  . Not on file   Social History Narrative  . No narrative on file    Family History: History reviewed. No pertinent family history.  Review of Systems: Positive: Hematuria, slow stream. Negative:  A further 10 point review of systems was negative except what is listed in the HPI.  Physical Exam: @VITALS2 @ General: No acute distress.  Awake. Head:  Normocephalic.  Atraumatic. ENT:  EOMI.  Mucous membranes moist Neck:  Supple.  No lymphadenopathy. CV:  S1 present. S2 present. Regular rate. Pulmonary: Equal effort bilaterally.  Clear to auscultation bilaterally. Abdomen: Soft.  Non tender to palpation. Skin:  Normal turgor.  No visible rash. Extremity: No gross deformity of bilateral upper extremities.  No gross deformity of    bilateral lower extremities. Neurologic: Alert. Appropriate mood.    Studies:  No results found for this basename: HGB, WBC, PLT,  in the last 72 hours  No results found for this basename: NA, K, CL, CO2, BUN, CREATININE, CALCIUM, MAGNESIUM, GFRNONAA,  GFRAA,  in the last 72 hours   No results found for this basename: PT, INR, APTT,  in the last 72 hours   No components found with this basename: ABG,     Assessment:  Recurrent urethral stricure  Plan: Cystoscopy, balloon dilation of stricture

## 2013-04-12 NOTE — Anesthesia Preprocedure Evaluation (Signed)
Anesthesia Evaluation  Patient identified by MRN, date of birth, ID band Patient awake    Reviewed: Allergy & Precautions, H&P , NPO status , Patient's Chart, lab work & pertinent test results  History of Anesthesia Complications (+) PONV  Airway Mallampati: II TM Distance: <3 FB Neck ROM: Full    Dental no notable dental hx.    Pulmonary neg pulmonary ROS,  breath sounds clear to auscultation  Pulmonary exam normal       Cardiovascular hypertension, Pt. on medications + CAD Rhythm:Regular Rate:Normal  EF 50%   Neuro/Psych negative neurological ROS  negative psych ROS   GI/Hepatic negative GI ROS, Neg liver ROS,   Endo/Other  Morbid obesity  Renal/GU negative Renal ROS  negative genitourinary   Musculoskeletal negative musculoskeletal ROS (+)   Abdominal   Peds negative pediatric ROS (+)  Hematology negative hematology ROS (+)   Anesthesia Other Findings   Reproductive/Obstetrics negative OB ROS                           Anesthesia Physical  Anesthesia Plan  ASA: III  Anesthesia Plan: General   Post-op Pain Management:    Induction: Intravenous  Airway Management Planned: LMA  Additional Equipment:   Intra-op Plan:   Post-operative Plan:   Informed Consent: I have reviewed the patients History and Physical, chart, labs and discussed the procedure including the risks, benefits and alternatives for the proposed anesthesia with the patient or authorized representative who has indicated his/her understanding and acceptance.   Dental advisory given  Plan Discussed with: CRNA  Anesthesia Plan Comments: (5 LMA used previously)        Anesthesia Quick Evaluation

## 2013-04-17 ENCOUNTER — Encounter (HOSPITAL_BASED_OUTPATIENT_CLINIC_OR_DEPARTMENT_OTHER): Payer: Self-pay | Admitting: Urology

## 2013-05-13 DIAGNOSIS — Z125 Encounter for screening for malignant neoplasm of prostate: Secondary | ICD-10-CM | POA: Diagnosis not present

## 2013-05-13 DIAGNOSIS — M109 Gout, unspecified: Secondary | ICD-10-CM | POA: Diagnosis not present

## 2013-05-13 DIAGNOSIS — I10 Essential (primary) hypertension: Secondary | ICD-10-CM | POA: Diagnosis not present

## 2013-05-13 DIAGNOSIS — I2510 Atherosclerotic heart disease of native coronary artery without angina pectoris: Secondary | ICD-10-CM | POA: Diagnosis not present

## 2013-05-13 DIAGNOSIS — R82998 Other abnormal findings in urine: Secondary | ICD-10-CM | POA: Diagnosis not present

## 2013-05-13 DIAGNOSIS — E785 Hyperlipidemia, unspecified: Secondary | ICD-10-CM | POA: Diagnosis not present

## 2013-05-15 DIAGNOSIS — R310 Gross hematuria: Secondary | ICD-10-CM | POA: Diagnosis not present

## 2013-05-15 DIAGNOSIS — N35919 Unspecified urethral stricture, male, unspecified site: Secondary | ICD-10-CM | POA: Diagnosis not present

## 2013-05-20 DIAGNOSIS — R809 Proteinuria, unspecified: Secondary | ICD-10-CM | POA: Diagnosis not present

## 2013-05-20 DIAGNOSIS — Z0000 Encounter for general adult medical examination without abnormal findings: Secondary | ICD-10-CM | POA: Diagnosis not present

## 2013-05-20 DIAGNOSIS — I2510 Atherosclerotic heart disease of native coronary artery without angina pectoris: Secondary | ICD-10-CM | POA: Diagnosis not present

## 2013-05-20 DIAGNOSIS — R39198 Other difficulties with micturition: Secondary | ICD-10-CM | POA: Diagnosis not present

## 2013-05-20 DIAGNOSIS — K7689 Other specified diseases of liver: Secondary | ICD-10-CM | POA: Diagnosis not present

## 2013-05-20 DIAGNOSIS — C61 Malignant neoplasm of prostate: Secondary | ICD-10-CM | POA: Diagnosis not present

## 2013-05-20 DIAGNOSIS — I428 Other cardiomyopathies: Secondary | ICD-10-CM | POA: Diagnosis not present

## 2013-05-20 DIAGNOSIS — N200 Calculus of kidney: Secondary | ICD-10-CM | POA: Diagnosis not present

## 2013-05-21 DIAGNOSIS — Z1212 Encounter for screening for malignant neoplasm of rectum: Secondary | ICD-10-CM | POA: Diagnosis not present

## 2013-07-17 ENCOUNTER — Encounter: Payer: Self-pay | Admitting: Internal Medicine

## 2013-07-21 ENCOUNTER — Encounter (HOSPITAL_COMMUNITY): Payer: Self-pay | Admitting: Emergency Medicine

## 2013-07-21 ENCOUNTER — Emergency Department (HOSPITAL_COMMUNITY)
Admission: EM | Admit: 2013-07-21 | Discharge: 2013-07-21 | Disposition: A | Discharge status: Home / Self Care | Payer: Medicare Other | Attending: Emergency Medicine | Admitting: Emergency Medicine

## 2013-07-21 DIAGNOSIS — Z7982 Long term (current) use of aspirin: Secondary | ICD-10-CM | POA: Insufficient documentation

## 2013-07-21 DIAGNOSIS — Y93G9 Activity, other involving cooking and grilling: Secondary | ICD-10-CM | POA: Insufficient documentation

## 2013-07-21 DIAGNOSIS — I10 Essential (primary) hypertension: Secondary | ICD-10-CM | POA: Insufficient documentation

## 2013-07-21 DIAGNOSIS — I2510 Atherosclerotic heart disease of native coronary artery without angina pectoris: Secondary | ICD-10-CM | POA: Diagnosis not present

## 2013-07-21 DIAGNOSIS — Z87820 Personal history of traumatic brain injury: Secondary | ICD-10-CM | POA: Insufficient documentation

## 2013-07-21 DIAGNOSIS — Z9861 Coronary angioplasty status: Secondary | ICD-10-CM | POA: Insufficient documentation

## 2013-07-21 DIAGNOSIS — Z87828 Personal history of other (healed) physical injury and trauma: Secondary | ICD-10-CM | POA: Insufficient documentation

## 2013-07-21 DIAGNOSIS — Z792 Long term (current) use of antibiotics: Secondary | ICD-10-CM | POA: Insufficient documentation

## 2013-07-21 DIAGNOSIS — Z87442 Personal history of urinary calculi: Secondary | ICD-10-CM | POA: Insufficient documentation

## 2013-07-21 DIAGNOSIS — Z87448 Personal history of other diseases of urinary system: Secondary | ICD-10-CM | POA: Diagnosis not present

## 2013-07-21 DIAGNOSIS — Z8546 Personal history of malignant neoplasm of prostate: Secondary | ICD-10-CM | POA: Insufficient documentation

## 2013-07-21 DIAGNOSIS — W260XXA Contact with knife, initial encounter: Secondary | ICD-10-CM | POA: Insufficient documentation

## 2013-07-21 DIAGNOSIS — S61209A Unspecified open wound of unspecified finger without damage to nail, initial encounter: Secondary | ICD-10-CM | POA: Diagnosis not present

## 2013-07-21 DIAGNOSIS — IMO0002 Reserved for concepts with insufficient information to code with codable children: Secondary | ICD-10-CM

## 2013-07-21 DIAGNOSIS — Z79899 Other long term (current) drug therapy: Secondary | ICD-10-CM | POA: Insufficient documentation

## 2013-07-21 DIAGNOSIS — Y929 Unspecified place or not applicable: Secondary | ICD-10-CM | POA: Insufficient documentation

## 2013-07-21 NOTE — ED Provider Notes (Signed)
CSN: [id number]     Arrival date & time 07/21/13  1538 History  This chart was scribed for Sean Gourd, PA-C, non-physician practitioner working with Flint Melter, MD, by Karle Plumber, ED Scribe.  This patient was seen in room WTR8/WTR8 and the patient's care was started at 3:52 PM.  Chief Complaint  Patient presents with  . finger laceration    The history is provided by the patient. No language interpreter was used.   HPI Comments:  BENJIMEN KELLEY III is a 67 y.o. male who presents to the Emergency Department complaining of left ring finger skin tear onset accidentally while cleaning a knife PTA. He reports only mild pain to the area, but expresses concern that his bleeding is difficult to control. Pt states he takes ASA 325 mg daily. He denies numbness or tingling. Pt reports tetanus is UTD. PCP is Dr. Timothy Lasso at [facility]   Past Medical History  Diagnosis Date  . Hypertension   . H/O simple renal cyst   . History of kidney stones   . History of concussion AS  CHILD-- NO RESIDUAL  . Urethral stricture   . PONV (postoperative nausea and vomiting)   . Hematuria   . Nocturia   . Coronary artery disease CARDIOLOGIST- DR CRENSHAW  . History of prostate cancer     S/P RADIOACTIVE SEED IMPLANTS AND EXTERNAL RADIATION---  NO RECURRENCE   Past Surgical History  Procedure Laterality Date  . Radioactive seed implants, prostate  11-26-2010  . Right ureteroscopic stone extraction  09-04-2003  . Cardiac catheterization  12-18-2003    SEVERE TWO VESSEL CAD/  FIRST DIAGONAL  95% STENOSIS/ CIRCUMFLEX 95% STENOSIS IN THE OSTIUM OF LARGE FIRST OM THAT WAS OCCLUDED AFTER THE FIRST MARGINAL/  COLLATERAL FLOW FROM THE LAD TO THE OBTUSE MARGINALS/  EF 50%---- MEDICAL MANAGEMENT  . Transthoracic echocardiogram  09-22-2003    LVSF NORMAL/  MILD TO MODERATE PULMONARY REGURG.  . Cardiovascular stress test  05-16-2011   DR CRENSHAW    PROBABLE NORMAL PERFUSION AND SOFT TISSUE ATTENUATION/ NO  SIGNIFICANT ISCHEMIA OR SCAR /  EF  59%  . Cystoscopy with urethral dilatation  04/25/2012    Procedure: CYSTOSCOPY WITH URETHRAL DILATATION;  Surgeon: Marcine Matar, MD;  Location: [facility];  Service: Urology;  Laterality: N/A;   CYSTOSCOPY, BALLOON DILATION OF URETHRAL STRICTURE  . Cystoscopy with urethral dilatation N/A 04/12/2013    Procedure: CYSTOSCOPY WITH URETHRAL DILATATION;  Surgeon: Marcine Matar, MD;  Location: [facility];  Service: Urology;  Laterality: N/A;  45 MIN    No family history on file. History  Substance Use Topics  . Smoking status: Never Smoker   . Smokeless tobacco: Never Used  . Alcohol Use: No    Review of Systems  Skin: Positive for wound (skin tear on left ring finger).  Neurological: Negative for numbness.       Denies tingling.   All other systems reviewed and are negative.    Allergies  Codeine; Lariam; and Morphine and related  Home Medications   Current Outpatient Rx  Name  Route  Sig  Dispense  Refill  . allopurinol (ZYLOPRIM) 100 MG tablet   Oral   Take 100 mg by mouth every evening.          . Ascorbic Acid (VITAMIN C) 1000 MG tablet   Oral   Take 1,000 mg by mouth daily.          Marland Kitchen aspirin  325 MG tablet   Oral   Take 325 mg by mouth daily.          Marland Kitchen atorvastatin (LIPITOR) 80 MG tablet   Oral   Take 80 mg by mouth every evening.          . cephALEXin (KEFLEX) 250 MG capsule   Oral   Take 2 capsules (500 mg total) by mouth 2 (two) times daily.   10 capsule   0   . Meth-Hyo-M Bl-Na Phos-Ph Sal (URIBEL) 118 MG CAPS   Oral   Take 1 capsule (118 mg total) by mouth every 8 (eight) hours as needed.   20 capsule   1     Sent to pharmacy   . metoprolol succinate (TOPROL-XL) 50 MG 24 hr tablet   Oral   Take 75 mg by mouth every morning. Take with or immediately following a meal.         . Multiple Vitamins-Minerals (MULTIVITAMINS THER. W/MINERALS) TABS   Oral   Take 1  tablet by mouth daily.          . ramipril (ALTACE) 10 MG capsule   Oral   Take 10 mg by mouth daily.           There were no vitals taken for this visit.  Physical Exam  Nursing note and vitals reviewed. Constitutional: He is oriented to person, place, and time. He appears well-developed and well-nourished. No distress.  HENT:  Head: Normocephalic and atraumatic.  Eyes: Conjunctivae and EOM are normal.  Neck: Normal range of motion. Neck supple.  Cardiovascular: Normal rate, regular rhythm and normal heart sounds.   Pulmonary/Chest: Effort normal and breath sounds normal.  Musculoskeletal: Normal range of motion. He exhibits no edema.  Neurological: He is alert and oriented to person, place, and time.  Skin: Skin is warm and dry.  3 MM skin tear on the lateral aspect of the ring finger. Active bleeding, non-tender. Cap refill less than 3 seconds. Intact distal pulses.  Psychiatric: He has a normal mood and affect. His behavior is normal.    ED Course  Procedures (including critical care time) DIAGNOSTIC STUDIES: Oxygen Saturation is 100% on RA,  by my interpretation.   COORDINATION OF CARE: 3:54 PM- Will apply Quick Clot. Pt verbalizes understanding and agrees to plan.  Labs Review Labs Reviewed - No data to display Imaging Review No results found.  MDM   1. Laceration      Patient with skin tear laceration to left ring finger. Neurovascularly intact. Full ROM of finger. NAD. Quick clot applied. Wound care given. Return precautions discussed. Patient states understanding of treatment care plan and is agreeable.  I personally performed the services described in this documentation, which was scribed in my presence. The recorded information has been reviewed and is accurate.   Trevor Mace, PA-C 07/21/13 1620

## 2013-07-21 NOTE — ED Notes (Signed)
Per pt, was making sandwich and was gathering knife when he cut left ring finger

## 2013-07-21 NOTE — ED Provider Notes (Signed)
Medical screening examination/treatment/procedure(s) were performed by non-physician practitioner and as supervising physician I was immediately available for consultation/collaboration.  Flint Melter, MD 07/21/13 [phone]

## 2013-07-26 ENCOUNTER — Ambulatory Visit (AMBULATORY_SURGERY_CENTER): Payer: Self-pay | Admitting: *Deleted

## 2013-07-26 VITALS — Ht 70.0 in | Wt 306.0 lb

## 2013-07-26 DIAGNOSIS — Z1211 Encounter for screening for malignant neoplasm of colon: Secondary | ICD-10-CM

## 2013-07-26 MED ORDER — MOVIPREP 100 G PO SOLR: 1.0000 | Freq: Once | ORAL | Status: DC

## 2013-07-26 NOTE — Progress Notes (Signed)
Denies allergies to eggs or soy products. Denies complications with sedation and anesthesia. 

## 2013-07-29 DIAGNOSIS — H524 Presbyopia: Secondary | ICD-10-CM | POA: Diagnosis not present

## 2013-07-29 DIAGNOSIS — D2310 Other benign neoplasm of skin of unspecified eyelid, including canthus: Secondary | ICD-10-CM | POA: Diagnosis not present

## 2013-07-29 DIAGNOSIS — H259 Unspecified age-related cataract: Secondary | ICD-10-CM | POA: Diagnosis not present

## 2013-07-29 DIAGNOSIS — H52209 Unspecified astigmatism, unspecified eye: Secondary | ICD-10-CM | POA: Diagnosis not present

## 2013-08-09 ENCOUNTER — Ambulatory Visit (AMBULATORY_SURGERY_CENTER): Payer: Medicare Other | Admitting: Internal Medicine

## 2013-08-09 ENCOUNTER — Encounter: Payer: Self-pay | Admitting: Internal Medicine

## 2013-08-09 VITALS — BP 148/88 | HR 67 | Temp 97.4°F | Resp 17 | Ht 70.0 in | Wt 306.0 lb

## 2013-08-09 DIAGNOSIS — Z1211 Encounter for screening for malignant neoplasm of colon: Secondary | ICD-10-CM | POA: Diagnosis not present

## 2013-08-09 DIAGNOSIS — D126 Benign neoplasm of colon, unspecified: Secondary | ICD-10-CM | POA: Diagnosis not present

## 2013-08-09 DIAGNOSIS — I2510 Atherosclerotic heart disease of native coronary artery without angina pectoris: Secondary | ICD-10-CM | POA: Diagnosis not present

## 2013-08-09 MED ORDER — SODIUM CHLORIDE 0.9 % IV SOLN: 500.0000 mL | INTRAVENOUS | Status: DC

## 2013-08-09 NOTE — Progress Notes (Signed)
Patient did not experience any of the following events: a burn prior to discharge; a fall within the facility; wrong site/side/patient/procedure/implant event; or a hospital transfer or hospital admission upon discharge from the facility. (G8907) Patient did not have preoperative order for IV antibiotic SSI prophylaxis. (G8918)  

## 2013-08-09 NOTE — Op Note (Signed)
[facility] [address] [place], [address]   COLONOSCOPY PROCEDURE REPORT  PATIENT: Sean [name], Sean [name]  MR#: [id number] BIRTHDATE: [birth year] , 66  yrs. old GENDER: Male ENDOSCOPIST: Hart Carwin, MD REFERRED [id number] Timothy Lasso, M.D. PROCEDURE DATE:  08/09/2013 PROCEDURE:   Colonoscopy with cold biopsy polypectomy and Colonoscopy with snare polypectomy First Screening Colonoscopy - Avg.  risk and is 50 yrs.  old or older - No.  Prior Negative Screening - Now for repeat screening. 10 or more years since last screening  History of Adenoma - Now for follow-up colonoscopy & has been > or = to 3 yrs.  N/A  Polyps Removed Today? Yes. ASA CLASS:   Class [name] INDICATIONS:Average risk patient for colon cancer. MEDICATIONS: MAC sedation, administered by CRNA and propofol (Diprivan) 200mg  IV  DESCRIPTION OF PROCEDURE:   After the risks benefits and alternatives of the procedure were thoroughly explained, informed consent was obtained.  A digital rectal exam revealed no abnormalities of the rectum.   The LB [id number]  endoscope was introduced through the anus and advanced to the cecum, which was identified by both the appendix and ileocecal valve. No adverse events experienced.   The quality of the prep was good, using MoviPrep  The instrument was then slowly withdrawn as the colon was fully examined.      COLON FINDINGS: Seven polypoid shaped sessile polyps ranging between 3-9mm in size were found throughout the entire examined colon. ( polyps at 5 cm, 3 polyps in the ascending colon, 1 polyp which was snared was  at 40cm ) A polypectomy was performed with cold forceps and with a cold snare.  The resection was complete and the polyp tissue was completely retrieved.   Small internal hemorrhoids were found.  Retroflexed views revealed no abnormalities. The time to cecum=8 minutes 59 seconds.  Withdrawal time=8 minutes 16 seconds. The scope was withdrawn and the  procedure completed. COMPLICATIONS: There were no complications.  ENDOSCOPIC IMPRESSION: 1.   Seven sessile polyps ranging between 3-43mm in size were found throughout the entire examined colon; polypectomy was performed with cold forceps and with a cold snare 2.   Small internal hemorrhoids  RECOMMENDATIONS: 1.  Await pathology results 2.   no ASA or NSAIDs x 2 weeks3. recall colon pending Path report   eSigned:  Hart Carwin, MD 08/09/2013 9:06 AM   cc:   PATIENT NAME:  Sean [name], Sean [name] MR#: [id number]

## 2013-08-09 NOTE — Patient Instructions (Addendum)
Discharge instructions given with verbal understanding. Handouts on polyps and hemorrhoids given. Resume previous medications. YOU HAD AN ENDOSCOPIC PROCEDURE TODAY AT THE [facility]: Refer to the procedure report that was given to you for any specific questions about what was found during the examination.  If the procedure report does not answer your questions, please call your gastroenterologist to clarify.  If you requested that your care partner not be given the details of your procedure findings, then the procedure report has been included in a sealed envelope for you to review at your convenience later.  YOU SHOULD EXPECT: Some feelings of bloating in the abdomen. Passage of more gas than usual.  Walking can help get rid of the air that was put into your GI tract during the procedure and reduce the bloating. If you had a lower endoscopy (such as a colonoscopy or flexible sigmoidoscopy) you may notice spotting of blood in your stool or on the toilet paper. If you underwent a bowel prep for your procedure, then you may not have a normal bowel movement for a few days.  DIET: Your first meal following the procedure should be a light meal and then it is ok to progress to your normal diet.  A half-sandwich or bowl of soup is an example of a good first meal.  Heavy or fried foods are harder to digest and may make you feel nauseous or bloated.  Likewise meals heavy in dairy and vegetables can cause extra gas to form and this can also increase the bloating.  Drink plenty of fluids but you should avoid alcoholic beverages for 24 hours.  ACTIVITY: Your care partner should take you home directly after the procedure.  You should plan to take it easy, moving slowly for the rest of the day.  You can resume normal activity the day after the procedure however you should NOT DRIVE or use heavy machinery for 24 hours (because of the sedation medicines used during the test).    SYMPTOMS TO REPORT  IMMEDIATELY: A gastroenterologist can be reached at any hour.  During normal business hours, 8:30 AM to 5:00 PM Monday through Friday, call [phone].  After hours and on weekends, please call the GI answering service at [phone] who will take a message and have the physician on call contact you.   Following lower endoscopy (colonoscopy or flexible sigmoidoscopy):  Excessive amounts of blood in the stool  Significant tenderness or worsening of abdominal pains  Swelling of the abdomen that is new, acute  Fever of 100F or higher FOLLOW UP: If any biopsies were taken you will be contacted by phone or by letter within the next 1-3 weeks.  Call your gastroenterologist if you have not heard about the biopsies in 3 weeks.  Our staff will call the home number listed on your records the next business day following your procedure to check on you and address any questions or concerns that you may have at that time regarding the information given to you following your procedure. This is a courtesy call and so if there is no answer at the home number and we have not heard from you through the emergency physician on call, we will assume that you have returned to your regular daily activities without incident.  SIGNATURES/CONFIDENTIALITY: You and/or your care partner have signed paperwork which will be entered into your electronic medical record.  These signatures attest to the fact that that the information above on your After Visit Summary   has been reviewed and is understood.  Full responsibility of the confidentiality of this discharge information lies with you and/or your care-partner.  

## 2013-08-09 NOTE — Progress Notes (Signed)
Called to room to assist during endoscopic procedure.  Patient ID and intended procedure confirmed with present staff. Received instructions for my participation in the procedure from the performing physician.  

## 2013-08-09 NOTE — Progress Notes (Signed)
A/ox3 pleased with MAC, report to Celia RN 

## 2013-08-12 ENCOUNTER — Telehealth: Payer: Self-pay

## 2013-08-12 NOTE — Telephone Encounter (Signed)
  Follow up Call-  Call back number 08/09/2013  Post procedure Call Back phone  # [phone]  Permission to leave phone message Yes     Patient questions:  Do you have a fever, pain , or abdominal swelling? no Pain Score  0 *  Have you tolerated food without any problems? yes  Have you been able to return to your normal activities? yes  Do you have any questions about your discharge instructions: Diet   no Medications  no Follow up visit  no  Do you have questions or concerns about your Care? no  Actions: * If pain score is 4 or above: No action needed, pain <4.

## 2013-08-13 ENCOUNTER — Encounter: Payer: Self-pay | Admitting: Internal Medicine

## 2013-08-21 DIAGNOSIS — N35919 Unspecified urethral stricture, male, unspecified site: Secondary | ICD-10-CM | POA: Diagnosis not present

## 2013-08-21 DIAGNOSIS — R350 Frequency of micturition: Secondary | ICD-10-CM | POA: Diagnosis not present

## 2013-08-21 DIAGNOSIS — R310 Gross hematuria: Secondary | ICD-10-CM | POA: Diagnosis not present

## 2013-08-21 DIAGNOSIS — C61 Malignant neoplasm of prostate: Secondary | ICD-10-CM | POA: Diagnosis not present

## 2013-08-26 DIAGNOSIS — Z23 Encounter for immunization: Secondary | ICD-10-CM | POA: Diagnosis not present

## 2013-11-19 DIAGNOSIS — C61 Malignant neoplasm of prostate: Secondary | ICD-10-CM | POA: Diagnosis not present

## 2013-11-19 DIAGNOSIS — M109 Gout, unspecified: Secondary | ICD-10-CM | POA: Diagnosis not present

## 2013-11-19 DIAGNOSIS — E785 Hyperlipidemia, unspecified: Secondary | ICD-10-CM | POA: Diagnosis not present

## 2013-11-19 DIAGNOSIS — I10 Essential (primary) hypertension: Secondary | ICD-10-CM | POA: Diagnosis not present

## 2013-11-19 DIAGNOSIS — K7689 Other specified diseases of liver: Secondary | ICD-10-CM | POA: Diagnosis not present

## 2013-11-19 DIAGNOSIS — I428 Other cardiomyopathies: Secondary | ICD-10-CM | POA: Diagnosis not present

## 2013-11-19 DIAGNOSIS — R39198 Other difficulties with micturition: Secondary | ICD-10-CM | POA: Diagnosis not present

## 2013-11-19 DIAGNOSIS — R319 Hematuria, unspecified: Secondary | ICD-10-CM | POA: Diagnosis not present

## 2013-11-25 DIAGNOSIS — N35919 Unspecified urethral stricture, male, unspecified site: Secondary | ICD-10-CM | POA: Diagnosis not present

## 2014-05-30 DIAGNOSIS — M109 Gout, unspecified: Secondary | ICD-10-CM | POA: Diagnosis not present

## 2014-05-30 DIAGNOSIS — I2510 Atherosclerotic heart disease of native coronary artery without angina pectoris: Secondary | ICD-10-CM | POA: Diagnosis not present

## 2014-05-30 DIAGNOSIS — I10 Essential (primary) hypertension: Secondary | ICD-10-CM | POA: Diagnosis not present

## 2014-05-30 DIAGNOSIS — E785 Hyperlipidemia, unspecified: Secondary | ICD-10-CM | POA: Diagnosis not present

## 2014-05-30 DIAGNOSIS — Z125 Encounter for screening for malignant neoplasm of prostate: Secondary | ICD-10-CM | POA: Diagnosis not present

## 2014-06-02 DIAGNOSIS — C61 Malignant neoplasm of prostate: Secondary | ICD-10-CM | POA: Diagnosis not present

## 2014-06-02 DIAGNOSIS — Z87448 Personal history of other diseases of urinary system: Secondary | ICD-10-CM | POA: Diagnosis not present

## 2014-06-06 DIAGNOSIS — I2510 Atherosclerotic heart disease of native coronary artery without angina pectoris: Secondary | ICD-10-CM | POA: Diagnosis not present

## 2014-06-06 DIAGNOSIS — I428 Other cardiomyopathies: Secondary | ICD-10-CM | POA: Diagnosis not present

## 2014-06-06 DIAGNOSIS — K7689 Other specified diseases of liver: Secondary | ICD-10-CM | POA: Diagnosis not present

## 2014-06-06 DIAGNOSIS — Z23 Encounter for immunization: Secondary | ICD-10-CM | POA: Diagnosis not present

## 2014-06-06 DIAGNOSIS — Z0000 Encounter for general adult medical examination without abnormal findings: Secondary | ICD-10-CM | POA: Diagnosis not present

## 2014-06-06 DIAGNOSIS — R7309 Other abnormal glucose: Secondary | ICD-10-CM | POA: Diagnosis not present

## 2014-06-06 DIAGNOSIS — Z6841 Body Mass Index (BMI) 40.0 and over, adult: Secondary | ICD-10-CM | POA: Diagnosis not present

## 2014-06-06 DIAGNOSIS — Z1331 Encounter for screening for depression: Secondary | ICD-10-CM | POA: Diagnosis not present

## 2014-06-06 DIAGNOSIS — R809 Proteinuria, unspecified: Secondary | ICD-10-CM | POA: Diagnosis not present

## 2014-10-22 ENCOUNTER — Telehealth: Payer: Self-pay | Admitting: Radiation Oncology

## 2014-10-23 ENCOUNTER — Ambulatory Visit: Admission: RE | Admit: 2014-10-23 | Payer: Medicare Other | Source: Ambulatory Visit | Admitting: Radiation Oncology

## 2014-12-04 DIAGNOSIS — Z1389 Encounter for screening for other disorder: Secondary | ICD-10-CM | POA: Diagnosis not present

## 2014-12-04 DIAGNOSIS — C61 Malignant neoplasm of prostate: Secondary | ICD-10-CM | POA: Diagnosis not present

## 2014-12-04 DIAGNOSIS — I429 Cardiomyopathy, unspecified: Secondary | ICD-10-CM | POA: Diagnosis not present

## 2014-12-04 DIAGNOSIS — R739 Hyperglycemia, unspecified: Secondary | ICD-10-CM | POA: Diagnosis not present

## 2014-12-04 DIAGNOSIS — Z6841 Body Mass Index (BMI) 40.0 and over, adult: Secondary | ICD-10-CM | POA: Diagnosis not present

## 2014-12-04 DIAGNOSIS — Z23 Encounter for immunization: Secondary | ICD-10-CM | POA: Diagnosis not present

## 2014-12-04 DIAGNOSIS — I10 Essential (primary) hypertension: Secondary | ICD-10-CM | POA: Diagnosis not present

## 2015-02-17 ENCOUNTER — Telehealth: Payer: Self-pay | Admitting: *Deleted

## 2015-02-17 NOTE — Telephone Encounter (Signed)
error 

## 2015-03-06 DIAGNOSIS — C61 Malignant neoplasm of prostate: Secondary | ICD-10-CM | POA: Diagnosis not present

## 2015-06-11 DIAGNOSIS — M109 Gout, unspecified: Secondary | ICD-10-CM | POA: Diagnosis not present

## 2015-06-11 DIAGNOSIS — R739 Hyperglycemia, unspecified: Secondary | ICD-10-CM | POA: Diagnosis not present

## 2015-06-11 DIAGNOSIS — I10 Essential (primary) hypertension: Secondary | ICD-10-CM | POA: Diagnosis not present

## 2015-06-11 DIAGNOSIS — Z125 Encounter for screening for malignant neoplasm of prostate: Secondary | ICD-10-CM | POA: Diagnosis not present

## 2015-06-11 DIAGNOSIS — I2510 Atherosclerotic heart disease of native coronary artery without angina pectoris: Secondary | ICD-10-CM | POA: Diagnosis not present

## 2015-06-11 DIAGNOSIS — E785 Hyperlipidemia, unspecified: Secondary | ICD-10-CM | POA: Diagnosis not present

## 2015-06-17 DIAGNOSIS — M9905 Segmental and somatic dysfunction of pelvic region: Secondary | ICD-10-CM | POA: Diagnosis not present

## 2015-06-17 DIAGNOSIS — M9903 Segmental and somatic dysfunction of lumbar region: Secondary | ICD-10-CM | POA: Diagnosis not present

## 2015-06-17 DIAGNOSIS — M9904 Segmental and somatic dysfunction of sacral region: Secondary | ICD-10-CM | POA: Diagnosis not present

## 2015-06-17 DIAGNOSIS — M25552 Pain in left hip: Secondary | ICD-10-CM | POA: Diagnosis not present

## 2015-06-17 DIAGNOSIS — M25551 Pain in right hip: Secondary | ICD-10-CM | POA: Diagnosis not present

## 2015-06-17 DIAGNOSIS — M5116 Intervertebral disc disorders with radiculopathy, lumbar region: Secondary | ICD-10-CM | POA: Diagnosis not present

## 2015-06-18 DIAGNOSIS — E785 Hyperlipidemia, unspecified: Secondary | ICD-10-CM | POA: Diagnosis not present

## 2015-06-18 DIAGNOSIS — M109 Gout, unspecified: Secondary | ICD-10-CM | POA: Diagnosis not present

## 2015-06-18 DIAGNOSIS — Z0000 Encounter for general adult medical examination without abnormal findings: Secondary | ICD-10-CM | POA: Diagnosis not present

## 2015-06-18 DIAGNOSIS — R809 Proteinuria, unspecified: Secondary | ICD-10-CM | POA: Diagnosis not present

## 2015-06-18 DIAGNOSIS — I2510 Atherosclerotic heart disease of native coronary artery without angina pectoris: Secondary | ICD-10-CM | POA: Diagnosis not present

## 2015-06-18 DIAGNOSIS — M5380 Other specified dorsopathies, site unspecified: Secondary | ICD-10-CM | POA: Diagnosis not present

## 2015-06-18 DIAGNOSIS — K760 Fatty (change of) liver, not elsewhere classified: Secondary | ICD-10-CM | POA: Diagnosis not present

## 2015-06-18 DIAGNOSIS — I429 Cardiomyopathy, unspecified: Secondary | ICD-10-CM | POA: Diagnosis not present

## 2015-06-18 DIAGNOSIS — I10 Essential (primary) hypertension: Secondary | ICD-10-CM | POA: Diagnosis not present

## 2015-06-18 DIAGNOSIS — R739 Hyperglycemia, unspecified: Secondary | ICD-10-CM | POA: Diagnosis not present

## 2015-06-18 DIAGNOSIS — Z6841 Body Mass Index (BMI) 40.0 and over, adult: Secondary | ICD-10-CM | POA: Diagnosis not present

## 2015-06-23 DIAGNOSIS — M9905 Segmental and somatic dysfunction of pelvic region: Secondary | ICD-10-CM | POA: Diagnosis not present

## 2015-06-23 DIAGNOSIS — M5136 Other intervertebral disc degeneration, lumbar region: Secondary | ICD-10-CM | POA: Diagnosis not present

## 2015-06-23 DIAGNOSIS — M25551 Pain in right hip: Secondary | ICD-10-CM | POA: Diagnosis not present

## 2015-06-23 DIAGNOSIS — M5116 Intervertebral disc disorders with radiculopathy, lumbar region: Secondary | ICD-10-CM | POA: Diagnosis not present

## 2015-06-23 DIAGNOSIS — M9903 Segmental and somatic dysfunction of lumbar region: Secondary | ICD-10-CM | POA: Diagnosis not present

## 2015-06-23 DIAGNOSIS — Z1212 Encounter for screening for malignant neoplasm of rectum: Secondary | ICD-10-CM | POA: Diagnosis not present

## 2015-06-23 DIAGNOSIS — M25552 Pain in left hip: Secondary | ICD-10-CM | POA: Diagnosis not present

## 2015-06-23 DIAGNOSIS — M9904 Segmental and somatic dysfunction of sacral region: Secondary | ICD-10-CM | POA: Diagnosis not present

## 2015-06-25 DIAGNOSIS — M9903 Segmental and somatic dysfunction of lumbar region: Secondary | ICD-10-CM | POA: Diagnosis not present

## 2015-06-25 DIAGNOSIS — M5136 Other intervertebral disc degeneration, lumbar region: Secondary | ICD-10-CM | POA: Diagnosis not present

## 2015-06-25 DIAGNOSIS — M5116 Intervertebral disc disorders with radiculopathy, lumbar region: Secondary | ICD-10-CM | POA: Diagnosis not present

## 2015-06-25 DIAGNOSIS — M25551 Pain in right hip: Secondary | ICD-10-CM | POA: Diagnosis not present

## 2015-06-25 DIAGNOSIS — M25552 Pain in left hip: Secondary | ICD-10-CM | POA: Diagnosis not present

## 2015-06-25 DIAGNOSIS — M5386 Other specified dorsopathies, lumbar region: Secondary | ICD-10-CM | POA: Diagnosis not present

## 2015-06-25 DIAGNOSIS — M9905 Segmental and somatic dysfunction of pelvic region: Secondary | ICD-10-CM | POA: Diagnosis not present

## 2015-06-25 DIAGNOSIS — M9904 Segmental and somatic dysfunction of sacral region: Secondary | ICD-10-CM | POA: Diagnosis not present

## 2015-06-29 DIAGNOSIS — M5116 Intervertebral disc disorders with radiculopathy, lumbar region: Secondary | ICD-10-CM | POA: Diagnosis not present

## 2015-06-29 DIAGNOSIS — M9903 Segmental and somatic dysfunction of lumbar region: Secondary | ICD-10-CM | POA: Diagnosis not present

## 2015-06-29 DIAGNOSIS — M5136 Other intervertebral disc degeneration, lumbar region: Secondary | ICD-10-CM | POA: Diagnosis not present

## 2015-06-29 DIAGNOSIS — M25552 Pain in left hip: Secondary | ICD-10-CM | POA: Diagnosis not present

## 2015-06-29 DIAGNOSIS — M9904 Segmental and somatic dysfunction of sacral region: Secondary | ICD-10-CM | POA: Diagnosis not present

## 2015-06-29 DIAGNOSIS — M9905 Segmental and somatic dysfunction of pelvic region: Secondary | ICD-10-CM | POA: Diagnosis not present

## 2015-06-29 DIAGNOSIS — M25551 Pain in right hip: Secondary | ICD-10-CM | POA: Diagnosis not present

## 2015-06-29 DIAGNOSIS — M5386 Other specified dorsopathies, lumbar region: Secondary | ICD-10-CM | POA: Diagnosis not present

## 2015-06-30 DIAGNOSIS — M9905 Segmental and somatic dysfunction of pelvic region: Secondary | ICD-10-CM | POA: Diagnosis not present

## 2015-06-30 DIAGNOSIS — M5116 Intervertebral disc disorders with radiculopathy, lumbar region: Secondary | ICD-10-CM | POA: Diagnosis not present

## 2015-06-30 DIAGNOSIS — M5136 Other intervertebral disc degeneration, lumbar region: Secondary | ICD-10-CM | POA: Diagnosis not present

## 2015-06-30 DIAGNOSIS — M25551 Pain in right hip: Secondary | ICD-10-CM | POA: Diagnosis not present

## 2015-06-30 DIAGNOSIS — M9903 Segmental and somatic dysfunction of lumbar region: Secondary | ICD-10-CM | POA: Diagnosis not present

## 2015-06-30 DIAGNOSIS — M9904 Segmental and somatic dysfunction of sacral region: Secondary | ICD-10-CM | POA: Diagnosis not present

## 2015-06-30 DIAGNOSIS — M25552 Pain in left hip: Secondary | ICD-10-CM | POA: Diagnosis not present

## 2015-06-30 DIAGNOSIS — M5386 Other specified dorsopathies, lumbar region: Secondary | ICD-10-CM | POA: Diagnosis not present

## 2015-07-02 DIAGNOSIS — M5136 Other intervertebral disc degeneration, lumbar region: Secondary | ICD-10-CM | POA: Diagnosis not present

## 2015-07-02 DIAGNOSIS — M9904 Segmental and somatic dysfunction of sacral region: Secondary | ICD-10-CM | POA: Diagnosis not present

## 2015-07-02 DIAGNOSIS — M5386 Other specified dorsopathies, lumbar region: Secondary | ICD-10-CM | POA: Diagnosis not present

## 2015-07-02 DIAGNOSIS — M9905 Segmental and somatic dysfunction of pelvic region: Secondary | ICD-10-CM | POA: Diagnosis not present

## 2015-07-02 DIAGNOSIS — M25551 Pain in right hip: Secondary | ICD-10-CM | POA: Diagnosis not present

## 2015-07-02 DIAGNOSIS — M5116 Intervertebral disc disorders with radiculopathy, lumbar region: Secondary | ICD-10-CM | POA: Diagnosis not present

## 2015-07-02 DIAGNOSIS — M25552 Pain in left hip: Secondary | ICD-10-CM | POA: Diagnosis not present

## 2015-07-02 DIAGNOSIS — M9903 Segmental and somatic dysfunction of lumbar region: Secondary | ICD-10-CM | POA: Diagnosis not present

## 2015-07-06 DIAGNOSIS — M9905 Segmental and somatic dysfunction of pelvic region: Secondary | ICD-10-CM | POA: Diagnosis not present

## 2015-07-06 DIAGNOSIS — M5116 Intervertebral disc disorders with radiculopathy, lumbar region: Secondary | ICD-10-CM | POA: Diagnosis not present

## 2015-07-06 DIAGNOSIS — M25551 Pain in right hip: Secondary | ICD-10-CM | POA: Diagnosis not present

## 2015-07-06 DIAGNOSIS — M9903 Segmental and somatic dysfunction of lumbar region: Secondary | ICD-10-CM | POA: Diagnosis not present

## 2015-07-06 DIAGNOSIS — M5136 Other intervertebral disc degeneration, lumbar region: Secondary | ICD-10-CM | POA: Diagnosis not present

## 2015-07-06 DIAGNOSIS — M5386 Other specified dorsopathies, lumbar region: Secondary | ICD-10-CM | POA: Diagnosis not present

## 2015-07-06 DIAGNOSIS — M9904 Segmental and somatic dysfunction of sacral region: Secondary | ICD-10-CM | POA: Diagnosis not present

## 2015-07-06 DIAGNOSIS — M25552 Pain in left hip: Secondary | ICD-10-CM | POA: Diagnosis not present

## 2015-07-07 DIAGNOSIS — M5386 Other specified dorsopathies, lumbar region: Secondary | ICD-10-CM | POA: Diagnosis not present

## 2015-07-07 DIAGNOSIS — M25551 Pain in right hip: Secondary | ICD-10-CM | POA: Diagnosis not present

## 2015-07-07 DIAGNOSIS — M9903 Segmental and somatic dysfunction of lumbar region: Secondary | ICD-10-CM | POA: Diagnosis not present

## 2015-07-07 DIAGNOSIS — M9905 Segmental and somatic dysfunction of pelvic region: Secondary | ICD-10-CM | POA: Diagnosis not present

## 2015-07-07 DIAGNOSIS — M5136 Other intervertebral disc degeneration, lumbar region: Secondary | ICD-10-CM | POA: Diagnosis not present

## 2015-07-07 DIAGNOSIS — M5116 Intervertebral disc disorders with radiculopathy, lumbar region: Secondary | ICD-10-CM | POA: Diagnosis not present

## 2015-07-07 DIAGNOSIS — M25552 Pain in left hip: Secondary | ICD-10-CM | POA: Diagnosis not present

## 2015-07-07 DIAGNOSIS — M9904 Segmental and somatic dysfunction of sacral region: Secondary | ICD-10-CM | POA: Diagnosis not present

## 2015-07-09 DIAGNOSIS — M5386 Other specified dorsopathies, lumbar region: Secondary | ICD-10-CM | POA: Diagnosis not present

## 2015-07-09 DIAGNOSIS — M25552 Pain in left hip: Secondary | ICD-10-CM | POA: Diagnosis not present

## 2015-07-09 DIAGNOSIS — M9905 Segmental and somatic dysfunction of pelvic region: Secondary | ICD-10-CM | POA: Diagnosis not present

## 2015-07-09 DIAGNOSIS — M5116 Intervertebral disc disorders with radiculopathy, lumbar region: Secondary | ICD-10-CM | POA: Diagnosis not present

## 2015-07-09 DIAGNOSIS — M9903 Segmental and somatic dysfunction of lumbar region: Secondary | ICD-10-CM | POA: Diagnosis not present

## 2015-07-09 DIAGNOSIS — M5136 Other intervertebral disc degeneration, lumbar region: Secondary | ICD-10-CM | POA: Diagnosis not present

## 2015-07-09 DIAGNOSIS — M25551 Pain in right hip: Secondary | ICD-10-CM | POA: Diagnosis not present

## 2015-07-09 DIAGNOSIS — M9904 Segmental and somatic dysfunction of sacral region: Secondary | ICD-10-CM | POA: Diagnosis not present

## 2015-07-13 DIAGNOSIS — M9903 Segmental and somatic dysfunction of lumbar region: Secondary | ICD-10-CM | POA: Diagnosis not present

## 2015-07-13 DIAGNOSIS — M5386 Other specified dorsopathies, lumbar region: Secondary | ICD-10-CM | POA: Diagnosis not present

## 2015-07-13 DIAGNOSIS — M9904 Segmental and somatic dysfunction of sacral region: Secondary | ICD-10-CM | POA: Diagnosis not present

## 2015-07-13 DIAGNOSIS — M5116 Intervertebral disc disorders with radiculopathy, lumbar region: Secondary | ICD-10-CM | POA: Diagnosis not present

## 2015-07-13 DIAGNOSIS — M5136 Other intervertebral disc degeneration, lumbar region: Secondary | ICD-10-CM | POA: Diagnosis not present

## 2015-07-13 DIAGNOSIS — M25552 Pain in left hip: Secondary | ICD-10-CM | POA: Diagnosis not present

## 2015-07-13 DIAGNOSIS — M25551 Pain in right hip: Secondary | ICD-10-CM | POA: Diagnosis not present

## 2015-07-13 DIAGNOSIS — M9905 Segmental and somatic dysfunction of pelvic region: Secondary | ICD-10-CM | POA: Diagnosis not present

## 2015-07-14 DIAGNOSIS — M5136 Other intervertebral disc degeneration, lumbar region: Secondary | ICD-10-CM | POA: Diagnosis not present

## 2015-07-14 DIAGNOSIS — M9905 Segmental and somatic dysfunction of pelvic region: Secondary | ICD-10-CM | POA: Diagnosis not present

## 2015-07-14 DIAGNOSIS — M25551 Pain in right hip: Secondary | ICD-10-CM | POA: Diagnosis not present

## 2015-07-14 DIAGNOSIS — M25552 Pain in left hip: Secondary | ICD-10-CM | POA: Diagnosis not present

## 2015-07-14 DIAGNOSIS — M5116 Intervertebral disc disorders with radiculopathy, lumbar region: Secondary | ICD-10-CM | POA: Diagnosis not present

## 2015-07-14 DIAGNOSIS — M9904 Segmental and somatic dysfunction of sacral region: Secondary | ICD-10-CM | POA: Diagnosis not present

## 2015-07-14 DIAGNOSIS — M9903 Segmental and somatic dysfunction of lumbar region: Secondary | ICD-10-CM | POA: Diagnosis not present

## 2015-07-14 DIAGNOSIS — M5386 Other specified dorsopathies, lumbar region: Secondary | ICD-10-CM | POA: Diagnosis not present

## 2015-07-16 DIAGNOSIS — M9905 Segmental and somatic dysfunction of pelvic region: Secondary | ICD-10-CM | POA: Diagnosis not present

## 2015-07-16 DIAGNOSIS — M5116 Intervertebral disc disorders with radiculopathy, lumbar region: Secondary | ICD-10-CM | POA: Diagnosis not present

## 2015-07-16 DIAGNOSIS — M9903 Segmental and somatic dysfunction of lumbar region: Secondary | ICD-10-CM | POA: Diagnosis not present

## 2015-07-16 DIAGNOSIS — M9904 Segmental and somatic dysfunction of sacral region: Secondary | ICD-10-CM | POA: Diagnosis not present

## 2015-07-16 DIAGNOSIS — M5386 Other specified dorsopathies, lumbar region: Secondary | ICD-10-CM | POA: Diagnosis not present

## 2015-07-16 DIAGNOSIS — M5136 Other intervertebral disc degeneration, lumbar region: Secondary | ICD-10-CM | POA: Diagnosis not present

## 2015-07-16 DIAGNOSIS — M25551 Pain in right hip: Secondary | ICD-10-CM | POA: Diagnosis not present

## 2015-07-16 DIAGNOSIS — M25552 Pain in left hip: Secondary | ICD-10-CM | POA: Diagnosis not present

## 2015-07-21 DIAGNOSIS — M25551 Pain in right hip: Secondary | ICD-10-CM | POA: Diagnosis not present

## 2015-07-21 DIAGNOSIS — M5116 Intervertebral disc disorders with radiculopathy, lumbar region: Secondary | ICD-10-CM | POA: Diagnosis not present

## 2015-07-21 DIAGNOSIS — M9905 Segmental and somatic dysfunction of pelvic region: Secondary | ICD-10-CM | POA: Diagnosis not present

## 2015-07-21 DIAGNOSIS — M5386 Other specified dorsopathies, lumbar region: Secondary | ICD-10-CM | POA: Diagnosis not present

## 2015-07-21 DIAGNOSIS — M25552 Pain in left hip: Secondary | ICD-10-CM | POA: Diagnosis not present

## 2015-07-21 DIAGNOSIS — M5136 Other intervertebral disc degeneration, lumbar region: Secondary | ICD-10-CM | POA: Diagnosis not present

## 2015-07-21 DIAGNOSIS — M9904 Segmental and somatic dysfunction of sacral region: Secondary | ICD-10-CM | POA: Diagnosis not present

## 2015-07-21 DIAGNOSIS — M9903 Segmental and somatic dysfunction of lumbar region: Secondary | ICD-10-CM | POA: Diagnosis not present

## 2015-07-30 DIAGNOSIS — M5116 Intervertebral disc disorders with radiculopathy, lumbar region: Secondary | ICD-10-CM | POA: Diagnosis not present

## 2015-07-30 DIAGNOSIS — M9903 Segmental and somatic dysfunction of lumbar region: Secondary | ICD-10-CM | POA: Diagnosis not present

## 2015-07-30 DIAGNOSIS — M9905 Segmental and somatic dysfunction of pelvic region: Secondary | ICD-10-CM | POA: Diagnosis not present

## 2015-07-30 DIAGNOSIS — M25551 Pain in right hip: Secondary | ICD-10-CM | POA: Diagnosis not present

## 2015-07-30 DIAGNOSIS — M25552 Pain in left hip: Secondary | ICD-10-CM | POA: Diagnosis not present

## 2015-07-30 DIAGNOSIS — M5136 Other intervertebral disc degeneration, lumbar region: Secondary | ICD-10-CM | POA: Diagnosis not present

## 2015-07-30 DIAGNOSIS — M5386 Other specified dorsopathies, lumbar region: Secondary | ICD-10-CM | POA: Diagnosis not present

## 2015-07-30 DIAGNOSIS — M9904 Segmental and somatic dysfunction of sacral region: Secondary | ICD-10-CM | POA: Diagnosis not present

## 2015-08-06 DIAGNOSIS — M9904 Segmental and somatic dysfunction of sacral region: Secondary | ICD-10-CM | POA: Diagnosis not present

## 2015-08-06 DIAGNOSIS — M9905 Segmental and somatic dysfunction of pelvic region: Secondary | ICD-10-CM | POA: Diagnosis not present

## 2015-08-06 DIAGNOSIS — M5386 Other specified dorsopathies, lumbar region: Secondary | ICD-10-CM | POA: Diagnosis not present

## 2015-08-06 DIAGNOSIS — M5116 Intervertebral disc disorders with radiculopathy, lumbar region: Secondary | ICD-10-CM | POA: Diagnosis not present

## 2015-08-06 DIAGNOSIS — M25552 Pain in left hip: Secondary | ICD-10-CM | POA: Diagnosis not present

## 2015-08-06 DIAGNOSIS — M5136 Other intervertebral disc degeneration, lumbar region: Secondary | ICD-10-CM | POA: Diagnosis not present

## 2015-08-06 DIAGNOSIS — M9903 Segmental and somatic dysfunction of lumbar region: Secondary | ICD-10-CM | POA: Diagnosis not present

## 2015-08-06 DIAGNOSIS — M25551 Pain in right hip: Secondary | ICD-10-CM | POA: Diagnosis not present

## 2015-08-10 DIAGNOSIS — Z23 Encounter for immunization: Secondary | ICD-10-CM | POA: Diagnosis not present

## 2015-08-12 DIAGNOSIS — M9903 Segmental and somatic dysfunction of lumbar region: Secondary | ICD-10-CM | POA: Diagnosis not present

## 2015-08-12 DIAGNOSIS — M5386 Other specified dorsopathies, lumbar region: Secondary | ICD-10-CM | POA: Diagnosis not present

## 2015-08-12 DIAGNOSIS — M25551 Pain in right hip: Secondary | ICD-10-CM | POA: Diagnosis not present

## 2015-08-12 DIAGNOSIS — M25552 Pain in left hip: Secondary | ICD-10-CM | POA: Diagnosis not present

## 2015-08-12 DIAGNOSIS — M5136 Other intervertebral disc degeneration, lumbar region: Secondary | ICD-10-CM | POA: Diagnosis not present

## 2015-08-12 DIAGNOSIS — M9905 Segmental and somatic dysfunction of pelvic region: Secondary | ICD-10-CM | POA: Diagnosis not present

## 2015-08-12 DIAGNOSIS — M9904 Segmental and somatic dysfunction of sacral region: Secondary | ICD-10-CM | POA: Diagnosis not present

## 2015-08-12 DIAGNOSIS — M5116 Intervertebral disc disorders with radiculopathy, lumbar region: Secondary | ICD-10-CM | POA: Diagnosis not present

## 2015-08-25 DIAGNOSIS — M5136 Other intervertebral disc degeneration, lumbar region: Secondary | ICD-10-CM | POA: Diagnosis not present

## 2015-08-25 DIAGNOSIS — M9904 Segmental and somatic dysfunction of sacral region: Secondary | ICD-10-CM | POA: Diagnosis not present

## 2015-08-25 DIAGNOSIS — M25552 Pain in left hip: Secondary | ICD-10-CM | POA: Diagnosis not present

## 2015-08-25 DIAGNOSIS — M9905 Segmental and somatic dysfunction of pelvic region: Secondary | ICD-10-CM | POA: Diagnosis not present

## 2015-08-25 DIAGNOSIS — M5386 Other specified dorsopathies, lumbar region: Secondary | ICD-10-CM | POA: Diagnosis not present

## 2015-08-25 DIAGNOSIS — M25551 Pain in right hip: Secondary | ICD-10-CM | POA: Diagnosis not present

## 2015-08-25 DIAGNOSIS — M5116 Intervertebral disc disorders with radiculopathy, lumbar region: Secondary | ICD-10-CM | POA: Diagnosis not present

## 2015-08-25 DIAGNOSIS — M9903 Segmental and somatic dysfunction of lumbar region: Secondary | ICD-10-CM | POA: Diagnosis not present

## 2015-10-28 DIAGNOSIS — H52203 Unspecified astigmatism, bilateral: Secondary | ICD-10-CM | POA: Diagnosis not present

## 2015-10-28 DIAGNOSIS — H524 Presbyopia: Secondary | ICD-10-CM | POA: Diagnosis not present

## 2015-10-28 DIAGNOSIS — H25813 Combined forms of age-related cataract, bilateral: Secondary | ICD-10-CM | POA: Diagnosis not present

## 2015-10-28 DIAGNOSIS — D2312 Other benign neoplasm of skin of left eyelid, including canthus: Secondary | ICD-10-CM | POA: Diagnosis not present

## 2015-12-29 DIAGNOSIS — Z6841 Body Mass Index (BMI) 40.0 and over, adult: Secondary | ICD-10-CM | POA: Diagnosis not present

## 2015-12-29 DIAGNOSIS — M1990 Unspecified osteoarthritis, unspecified site: Secondary | ICD-10-CM | POA: Diagnosis not present

## 2015-12-29 DIAGNOSIS — I10 Essential (primary) hypertension: Secondary | ICD-10-CM | POA: Diagnosis not present

## 2015-12-29 DIAGNOSIS — R7309 Other abnormal glucose: Secondary | ICD-10-CM | POA: Diagnosis not present

## 2015-12-29 DIAGNOSIS — I2510 Atherosclerotic heart disease of native coronary artery without angina pectoris: Secondary | ICD-10-CM | POA: Diagnosis not present

## 2015-12-29 DIAGNOSIS — C61 Malignant neoplasm of prostate: Secondary | ICD-10-CM | POA: Diagnosis not present

## 2015-12-29 DIAGNOSIS — E784 Other hyperlipidemia: Secondary | ICD-10-CM | POA: Diagnosis not present

## 2015-12-29 DIAGNOSIS — I428 Other cardiomyopathies: Secondary | ICD-10-CM | POA: Diagnosis not present

## 2015-12-29 DIAGNOSIS — M109 Gout, unspecified: Secondary | ICD-10-CM | POA: Diagnosis not present

## 2015-12-29 DIAGNOSIS — Z1389 Encounter for screening for other disorder: Secondary | ICD-10-CM | POA: Diagnosis not present

## 2016-01-25 DIAGNOSIS — J209 Acute bronchitis, unspecified: Secondary | ICD-10-CM | POA: Diagnosis not present

## 2016-01-25 DIAGNOSIS — I10 Essential (primary) hypertension: Secondary | ICD-10-CM | POA: Diagnosis not present

## 2016-01-25 DIAGNOSIS — Z6841 Body Mass Index (BMI) 40.0 and over, adult: Secondary | ICD-10-CM | POA: Diagnosis not present

## 2016-01-25 DIAGNOSIS — R05 Cough: Secondary | ICD-10-CM | POA: Diagnosis not present

## 2016-01-25 DIAGNOSIS — I2510 Atherosclerotic heart disease of native coronary artery without angina pectoris: Secondary | ICD-10-CM | POA: Diagnosis not present

## 2016-02-15 DIAGNOSIS — J208 Acute bronchitis due to other specified organisms: Secondary | ICD-10-CM | POA: Diagnosis not present

## 2016-02-29 DIAGNOSIS — J208 Acute bronchitis due to other specified organisms: Secondary | ICD-10-CM | POA: Diagnosis not present

## 2016-03-07 DIAGNOSIS — C61 Malignant neoplasm of prostate: Secondary | ICD-10-CM | POA: Diagnosis not present

## 2016-03-10 ENCOUNTER — Other Ambulatory Visit: Payer: Self-pay | Admitting: Internal Medicine

## 2016-03-10 DIAGNOSIS — R059 Cough, unspecified: Secondary | ICD-10-CM

## 2016-03-10 DIAGNOSIS — R05 Cough: Secondary | ICD-10-CM

## 2016-03-10 DIAGNOSIS — J209 Acute bronchitis, unspecified: Secondary | ICD-10-CM

## 2016-03-14 DIAGNOSIS — R350 Frequency of micturition: Secondary | ICD-10-CM | POA: Diagnosis not present

## 2016-03-14 DIAGNOSIS — Z0000 Encounter for general adult medical examination without abnormal findings: Secondary | ICD-10-CM | POA: Diagnosis not present

## 2016-03-14 DIAGNOSIS — R3915 Urgency of urination: Secondary | ICD-10-CM | POA: Diagnosis not present

## 2016-03-14 DIAGNOSIS — Z8546 Personal history of malignant neoplasm of prostate: Secondary | ICD-10-CM | POA: Diagnosis not present

## 2016-03-18 ENCOUNTER — Ambulatory Visit
Admission: RE | Admit: 2016-03-18 | Discharge: 2016-03-18 | Disposition: A | Discharge status: Home / Self Care | Payer: Medicare Other | Source: Ambulatory Visit | Attending: Internal Medicine | Admitting: Internal Medicine

## 2016-03-18 DIAGNOSIS — R05 Cough: Secondary | ICD-10-CM

## 2016-03-18 DIAGNOSIS — R911 Solitary pulmonary nodule: Secondary | ICD-10-CM | POA: Diagnosis not present

## 2016-03-18 DIAGNOSIS — R059 Cough, unspecified: Secondary | ICD-10-CM

## 2016-03-18 DIAGNOSIS — J209 Acute bronchitis, unspecified: Secondary | ICD-10-CM

## 2016-03-18 IMAGING — CT CT CHEST W/ CM
2 of 3 series · 15 of 30 positions shown, 18 images · IV contrast (75CC ISOVUE 300)
Comparison: Chest radiographs dated [DATE]. Limited CT chest
dated [DATE].

CLINICAL DATA: Cough, bronchitis, chest pain

EXAM:
CT CHEST WITH CONTRAST
TECHNIQUE: Multidetector CT imaging of the chest was performed during
intravenous contrast administration.
Creatinine was obtained on site at [HOSPITAL] at [REDACTED].
Results: Creatinine 0.9 mg/dL.
CONTRAST:  75mL [LO] IOPAMIDOL ([LO]) INJECTION 61%

[Series 3: chest with · axial · 0.86mm/px · z∈[-286,-14]mm · 10 of 135 slices shown, 13 images]
[im 13/135  mediastinal]
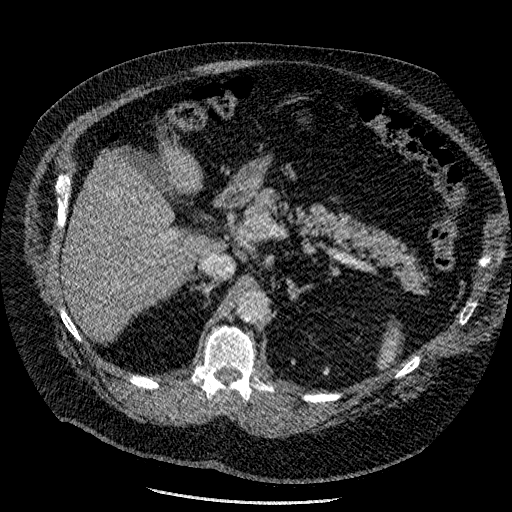
[im 13/135  lung]
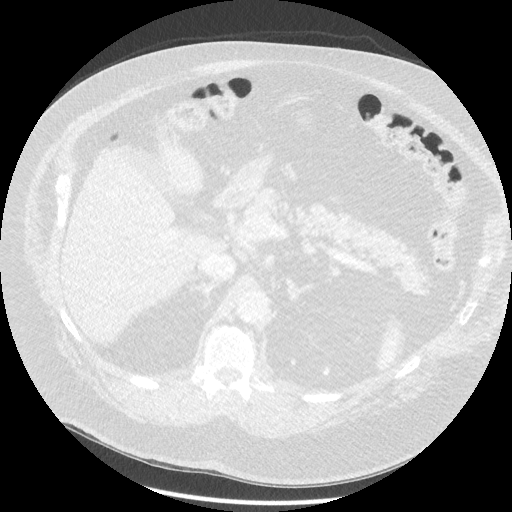
[im 25/135  lung]
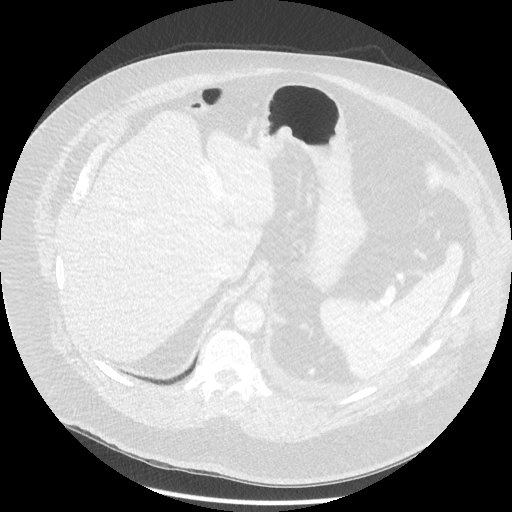
[im 37/135  lung]
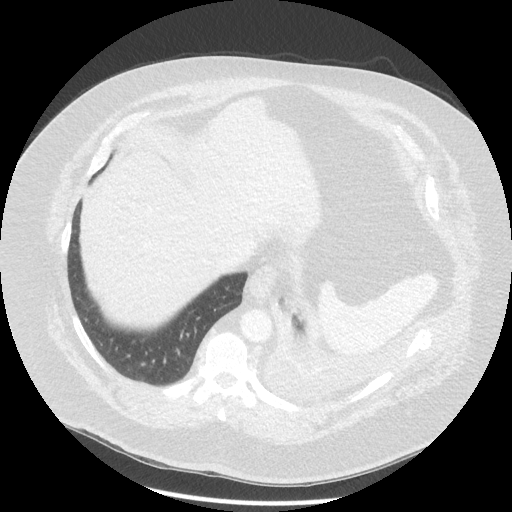
[im 49/135  lung]
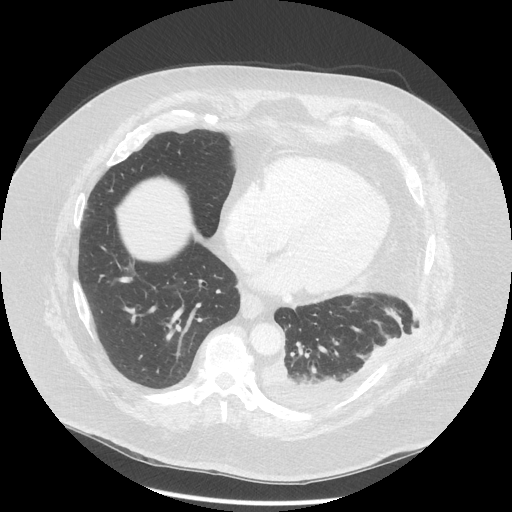
[im 61/135  mediastinal]
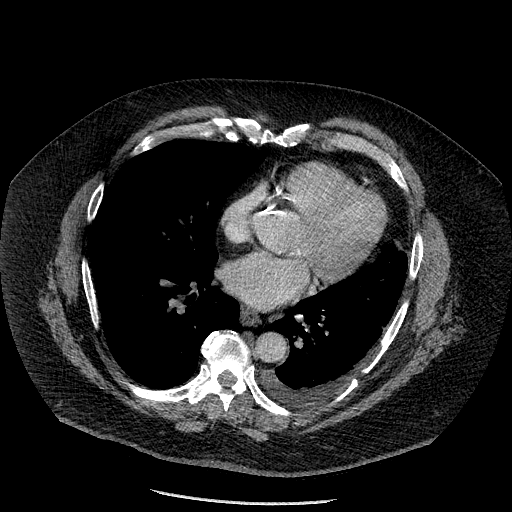
[im 61/135  lung]
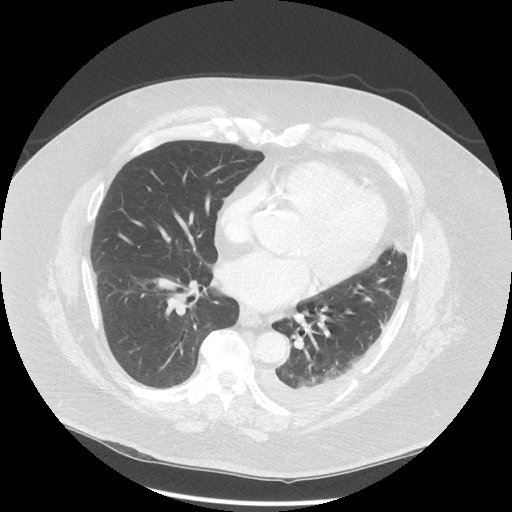
[im 74/135  lung]
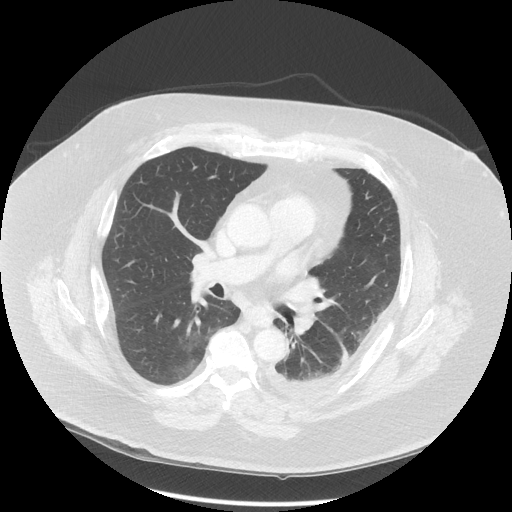
[im 86/135  lung]
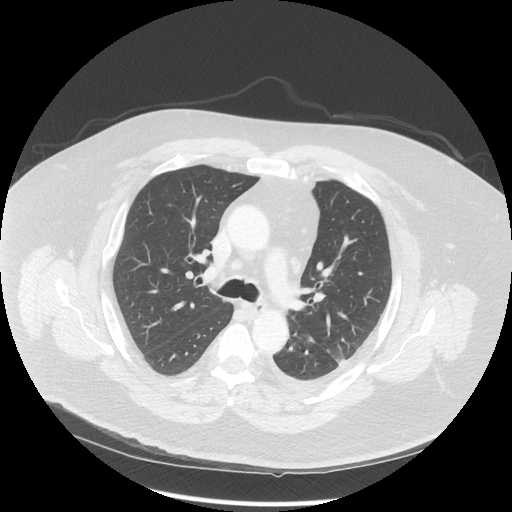
[im 98/135  lung]
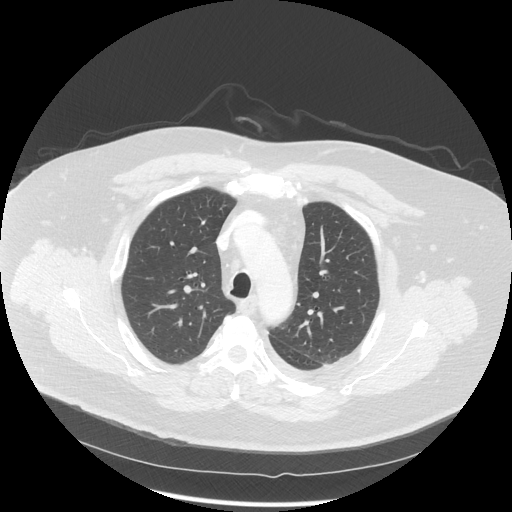
[im 110/135  mediastinal]
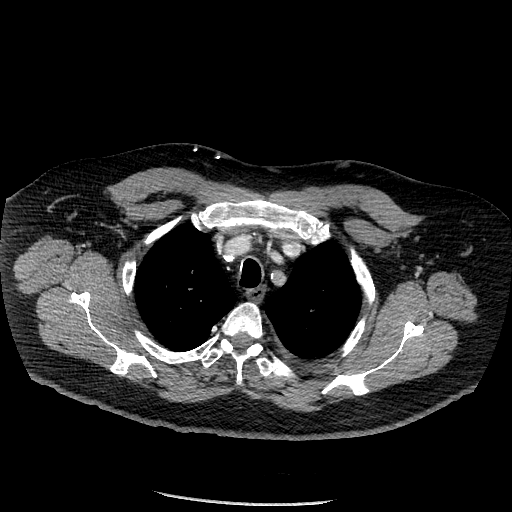
[im 110/135  lung]
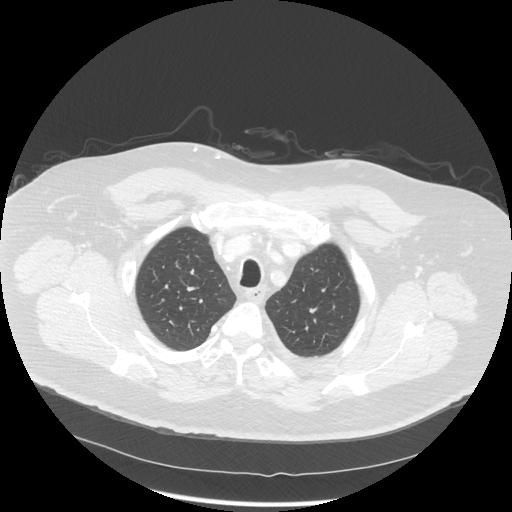
[im 122/135  lung]
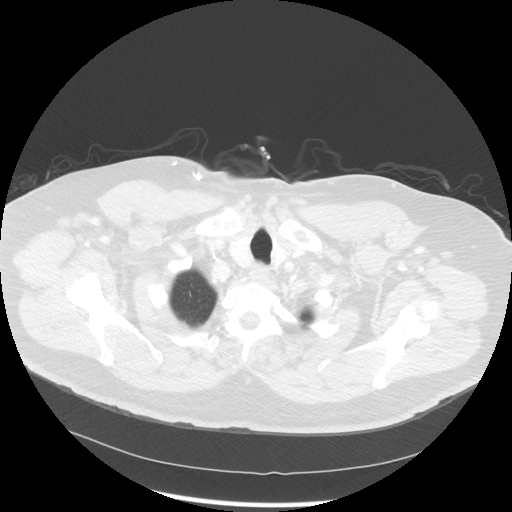

[Series 602: sagittal body · sagittal · 0.86mm/px · 5 of 177 slices shown]
[im 12/177  mediastinal]
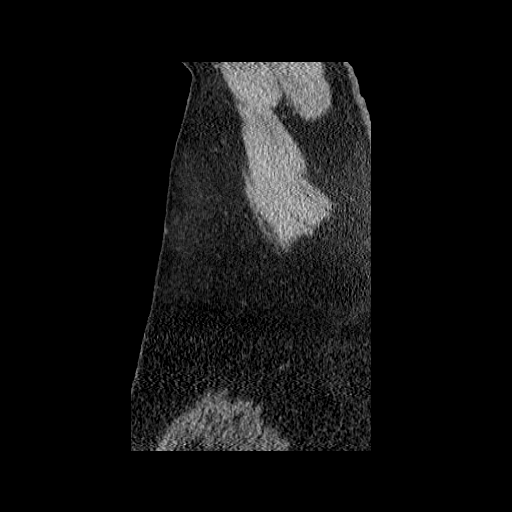
[im 36/177  mediastinal]
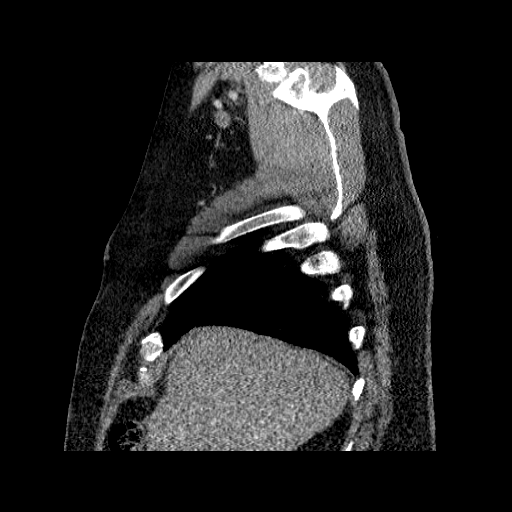
[im 59/177  mediastinal]
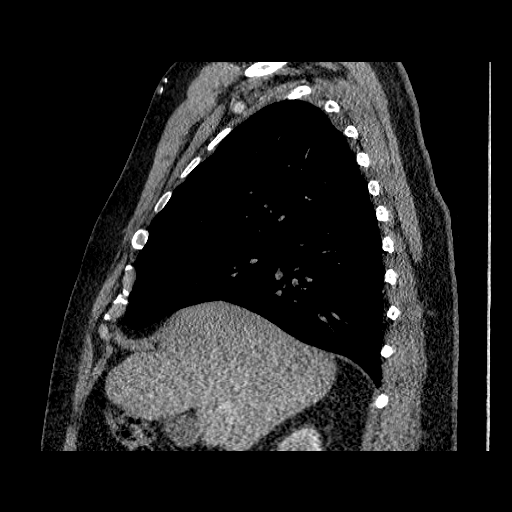
[im 83/177  mediastinal]
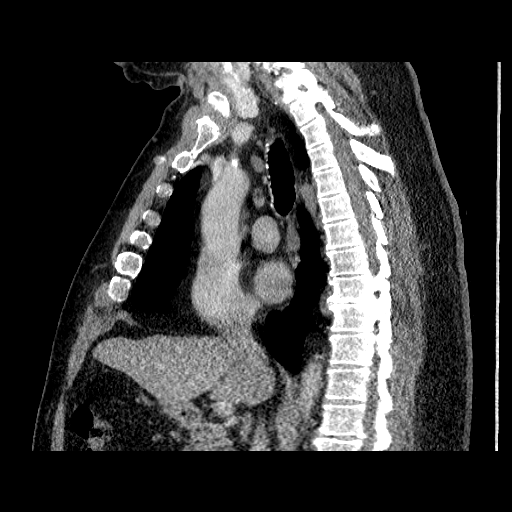
[im 94/177  mediastinal]
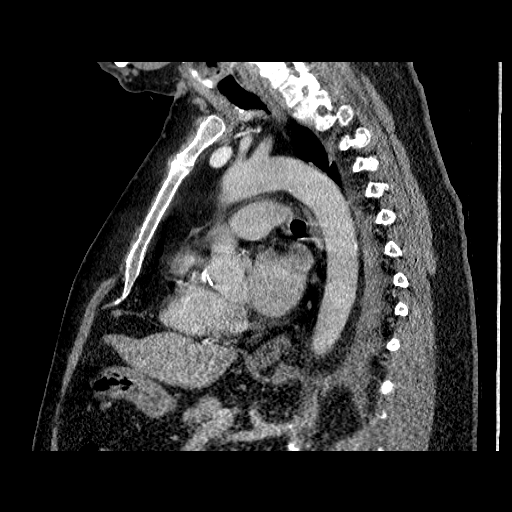

[15 of 30 positions shown; findings below may reference images not displayed]

FINDINGS: Mediastinum/Nodes: The heart is normal in size. No pericardial
effusion.

Three vessel coronary atherosclerosis.

Mild atherosclerotic calcifications of the aortic arch.

Small mediastinal lymph nodes measuring up to 9 mm short axis,
likely reactive.

Visualized thyroid is unremarkable.

Lungs/Pleura: Suspected postsurgical changes involving the left
posterolateral chest wall with overlying thoracotomy.

Associated small left pleural effusion, with a mildly irregular/
unusual appearance including herniation of the lung laterally along
the inferior chest wall (series 3/images 87-94), likely postsurgical
or post-traumatic.

Associated left lower lobe opacity, likely compressive atelectasis.

Mild volume loss in the left hemithorax.

No focal consolidation.

6 x 8 mm subpleural right middle lobe nodule along the minor fissure
(series 4/ image 65), not included on the prior limited CT chest.

No pneumothorax.

Upper abdomen: Visualized upper abdomen is notable for a 4.5 cm
enhancing lesion along the lateral left upper kidney (series 3/
image 135), incompletely visualized, suspicious for solid renal
neoplasm such as renal cell carcinoma.

Musculoskeletal: Mild degenerative changes of the visualized
thoracolumbar spine.

Postsurgical changes involving the left posterolateral 8th rib
related to prior thoracotomy (series 3/image 70). Healing fractures
involving the left posterolateral 9th and 10th ribs (series 3/
images 101 and 106).
IMPRESSION: 4.5 cm enhancing lesion along the lateral left upper kidney,
incompletely visualized, suspicious for solid renal neoplasm such as
renal cell carcinoma. MRI abdomen with/ without contrast is
suggested for further evaluation.

6 x 8 mm subpleural right middle lobe nodule along the right minor
fissure, indeterminate. This appearance is technically indeterminate
but not considered suspicious for metastatic disease. Non-contrast
chest CT at 6-12 months is recommended. If the nodule is stable at
time of repeat CT, then future CT at 18-24 months (from today's
scan) is considered optional for low-risk patients, but is
recommended for high-risk patients. This recommendation follows the
consensus statement: Guidelines for Management of Incidental
Pulmonary Nodules Detected on CT Images:From the [HOSPITAL]
[LO]; published online before print (10.1148/radiol.[PHONE_NUMBER]).

Postsurgical/post-traumatic changes involving the left lower chest
wall, as described above. Correlate with clinical history, as no
surgical history has been provided.

Associated small left pleural effusion with compressive atelectasis
in the left lower lobe. No findings suspicious for pneumonia.

These results will be called to the ordering clinician or
representative by the Radiologist Assistant, and communication
documented in the PACS or zVision Dashboard.

## 2016-03-18 MED ORDER — IOPAMIDOL (ISOVUE-300) INJECTION 61%
75.0000 mL | Freq: Once | INTRAVENOUS | Status: AC | PRN
  Administered 2016-03-18: 75 mL via INTRAVENOUS

## 2016-03-21 DIAGNOSIS — C642 Malignant neoplasm of left kidney, except renal pelvis: Secondary | ICD-10-CM | POA: Diagnosis not present

## 2016-03-21 DIAGNOSIS — N200 Calculus of kidney: Secondary | ICD-10-CM | POA: Diagnosis not present

## 2016-03-21 DIAGNOSIS — D4102 Neoplasm of uncertain behavior of left kidney: Secondary | ICD-10-CM | POA: Diagnosis not present

## 2016-04-05 DIAGNOSIS — D49512 Neoplasm of unspecified behavior of left kidney: Secondary | ICD-10-CM | POA: Diagnosis not present

## 2016-04-08 ENCOUNTER — Other Ambulatory Visit: Payer: Self-pay | Admitting: Urology

## 2016-04-20 ENCOUNTER — Telehealth: Payer: Self-pay | Admitting: Cardiovascular Disease

## 2016-04-20 NOTE — Telephone Encounter (Signed)
Received records from [facility] for appointment on 05/04/16 with Dr Gwenlyn Found.  Records given to [facility] (medical records) for Dr Kennon Holter schedule on 05/04/16. lp

## 2016-05-04 ENCOUNTER — Encounter: Payer: Self-pay | Admitting: Cardiovascular Disease

## 2016-05-04 ENCOUNTER — Ambulatory Visit (INDEPENDENT_AMBULATORY_CARE_PROVIDER_SITE_OTHER): Payer: Medicare Other | Admitting: Cardiovascular Disease

## 2016-05-04 VITALS — BP 122/80 | HR 68 | Ht 70.0 in | Wt 309.8 lb

## 2016-05-04 DIAGNOSIS — Z01818 Encounter for other preprocedural examination: Secondary | ICD-10-CM | POA: Diagnosis not present

## 2016-05-04 DIAGNOSIS — I2510 Atherosclerotic heart disease of native coronary artery without angina pectoris: Secondary | ICD-10-CM | POA: Diagnosis not present

## 2016-05-04 DIAGNOSIS — I10 Essential (primary) hypertension: Secondary | ICD-10-CM | POA: Diagnosis not present

## 2016-05-04 NOTE — Assessment & Plan Note (Signed)
History of hyperlipidemia on statin therapy followed by his PCP 

## 2016-05-04 NOTE — Patient Instructions (Addendum)
Medication Instructions:  Your physician recommends that you continue on your current medications as directed. Please refer to the Current Medication list given to you today.   Labwork: NONE  Testing/Procedures: Your physician has requested that you have a lexiscan myoview. For further information please visit [email or web address]. Please follow instruction sheet, as given.    Follow-Up: Your physician wants you to follow-up in: [place]. You will receive a reminder letter in the mail two months in advance. If you don't receive a letter, please call our office to schedule the follow-up appointment.   Any Other Special Instructions Will Be Listed Below (If Applicable).     If you need a refill on your cardiac medications before your next appointment, please call your pharmacy.

## 2016-05-04 NOTE — Assessment & Plan Note (Signed)
History of hypertension with blood pressure measurements at 122/80. He is on ramipril and metoprolol. Continue current meds at current dosing

## 2016-05-04 NOTE — Assessment & Plan Note (Signed)
History of CAD status post cardiac catheterization performed January 2005 revealing ostial diagonal branch stenosis. He also had a 95% stenosis of the ostium of the large first obtuse marginal branch. The circumflex was occluded after this. There was collateral flow from the LAD. EF was 50%. It was elected to treat her medically. In my view performed June 2009 that showed an EF of 54% with a small lateral infarct and mild apical ischemia felt to be low risk. He denies chest pain or shortness of breath. He is scheduled for a laparoscopic nephrectomy because of a recently discovered mass.I'm going to obtain a pharmacologic Myoview stress test to risk stratify him.

## 2016-05-04 NOTE — Progress Notes (Signed)
05/04/2016 Sean [name]   Jun 02, 1946  [id number]  Primary Physician Precious Reel, MD Primary Cardiologist: Lorretta Harp MD Renae Gloss  HPI:  Mr. Sean [name] is a 70 year old severely overweight single Caucasian male with no children who does not work. He was referred for preoperative clearance because of needing to undergo left breast. Nephrectomy for a recently discovered renal mass. He is a cardiology patient of Dr. Jacalyn Lefevre. Hypertension hyperlipidemia. His father died of a myocardial infarction at age 68. He has known CAD status post recatheterization performed January 2005 revealing ostial diagonal branch disease, a ostial circumflex obtuse marginal branch lesion after which the circumflex was occluded with preserved LV function. His last Myoview performed in 2009 showed mild inferior lateral infarct with peri-infarct ischemia.   Current Outpatient Prescriptions  Medication Sig Dispense Refill  . allopurinol (ZYLOPRIM) 100 MG tablet Take 200 mg by mouth every evening.     . Ascorbic Acid (VITAMIN C) 1000 MG tablet Take 1,000 mg by mouth daily.     Marland Kitchen aspirin 325 MG tablet Take 325 mg by mouth daily.     Marland Kitchen atorvastatin (LIPITOR) 80 MG tablet Take 80 mg by mouth every evening.     . Ginger, Zingiber officinalis, (GINGER PO) Take 1 capsule by mouth daily.    . metoprolol succinate (TOPROL-XL) 50 MG 24 hr tablet Take 75 mg by mouth every morning. Take with or immediately following a meal.    . Multiple Vitamins-Minerals (MULTIVITAMINS THER. W/MINERALS) TABS Take 1 tablet by mouth daily.     . NON FORMULARY Take 1 capsule by mouth daily. [place]    . ramipril (ALTACE) 10 MG capsule Take 10 mg by mouth daily.     . TURMERIC PO Take 1-2 tablets by mouth daily.     No current facility-administered medications for this visit.    Allergies  Allergen Reactions  . Codeine Other (See Comments)    constipation  . Lariam [Mefloquine Hcl] Other (See Comments)   Respiratory problems  . Morphine And Related Nausea And Vomiting    Social History   Social History  . Marital Status: Single    Spouse Name: N/A  . Number of Children: N/A  . Years of Education: N/A   Occupational History  . Not on file.   Social History Main Topics  . Smoking status: Never Smoker   . Smokeless tobacco: Never Used  . Alcohol Use: No  . Drug Use: No  . Sexual Activity: Not on file   Other Topics Concern  . Not on file   Social History Narrative     Review of Systems: General: negative for chills, fever, night sweats or weight changes.  Cardiovascular: negative for chest pain, dyspnea on exertion, edema, orthopnea, palpitations, paroxysmal nocturnal dyspnea or shortness of breath Dermatological: negative for rash Respiratory: negative for cough or wheezing Urologic: negative for hematuria Abdominal: negative for nausea, vomiting, diarrhea, bright red blood per rectum, melena, or hematemesis Neurologic: negative for visual changes, syncope, or dizziness All other systems reviewed and are otherwise negative except as noted above.    Blood pressure 122/80, pulse 68, height 5\' 10"  (1.778 m), weight 309 lb 12.8 oz (140.524 kg).  General appearance: alert and no distress Neck: no adenopathy, no carotid bruit, no JVD, supple, symmetrical, trachea midline and thyroid not enlarged, symmetric, no tenderness/mass/nodules Lungs: clear to auscultation bilaterally Heart: regular rate and rhythm, S1, S2 normal, no murmur, click, rub or gallop Extremities:  extremities normal, atraumatic, no cyanosis or edema  EKG normal sinus rhythm at 68 without ST or T-wave changes. I personally reviewed this EKG  ASSESSMENT AND PLAN:   HYPERLIPIDEMIA History of hyperlipidemia on statin therapy followed by his PCP  Essential hypertension History of hypertension with blood pressure measurements at 122/80. He is on ramipril and metoprolol. Continue current meds at current  dosing  Coronary atherosclerosis History of CAD status post cardiac catheterization performed January 2005 revealing ostial diagonal branch stenosis. He also had a 95% stenosis of the ostium of the large first obtuse marginal branch. The circumflex was occluded after this. There was collateral flow from the LAD. EF was 50%. It was elected to treat her medically. In my view performed June 2009 that showed an EF of 54% with a small lateral infarct and mild apical ischemia felt to be low risk. He denies chest pain or shortness of breath. He is scheduled for a laparoscopic nephrectomy because of a recently discovered mass.I'm going to obtain a pharmacologic Myoview stress test to risk stratify him.      Lorretta Harp MD FACP,FACC,FAHA, [facility] 05/04/2016 11:19 AM

## 2016-05-06 ENCOUNTER — Encounter: Payer: Self-pay | Admitting: Cardiovascular Disease

## 2016-05-06 ENCOUNTER — Telehealth (HOSPITAL_COMMUNITY): Payer: Self-pay

## 2016-05-06 NOTE — Telephone Encounter (Signed)
Encounter complete. 

## 2016-05-11 ENCOUNTER — Ambulatory Visit (HOSPITAL_COMMUNITY)
Admission: RE | Admit: 2016-05-11 | Discharge: 2016-05-11 | Disposition: A | Discharge status: Home / Self Care | Payer: Medicare Other | Source: Ambulatory Visit | Attending: Cardiovascular Disease | Admitting: Cardiovascular Disease

## 2016-05-11 DIAGNOSIS — Z6841 Body Mass Index (BMI) 40.0 and over, adult: Secondary | ICD-10-CM | POA: Insufficient documentation

## 2016-05-11 DIAGNOSIS — E669 Obesity, unspecified: Secondary | ICD-10-CM | POA: Insufficient documentation

## 2016-05-11 DIAGNOSIS — I10 Essential (primary) hypertension: Secondary | ICD-10-CM | POA: Insufficient documentation

## 2016-05-11 DIAGNOSIS — R9439 Abnormal result of other cardiovascular function study: Secondary | ICD-10-CM | POA: Diagnosis not present

## 2016-05-11 DIAGNOSIS — I2510 Atherosclerotic heart disease of native coronary artery without angina pectoris: Secondary | ICD-10-CM

## 2016-05-11 DIAGNOSIS — Z01818 Encounter for other preprocedural examination: Secondary | ICD-10-CM | POA: Insufficient documentation

## 2016-05-11 DIAGNOSIS — Z8249 Family history of ischemic heart disease and other diseases of the circulatory system: Secondary | ICD-10-CM | POA: Diagnosis not present

## 2016-05-11 IMAGING — NM NM MISC PROCEDURE
6 series · 36 of 36 positions shown · non-contrast
Comparison: none

[Series 1: wbr_s-proj_st wbr stress-gsp · 6.40mm/px · 6 of 512 frames shown]
[frame 43/512]
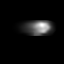
[frame 128/512]
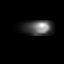
[frame 214/512]
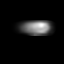
[frame 299/512]
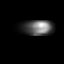
[frame 384/512]
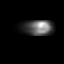
[frame 470/512]
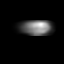

[Series 1: wbr stress-gsp · 6.40mm/px · 6 of 512 frames shown]
[frame 43/512]
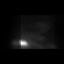
[frame 128/512]
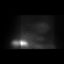
[frame 214/512]
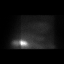
[frame 299/512]
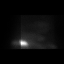
[frame 384/512]
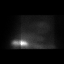
[frame 470/512]
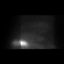

[Series 2: wbr_s-proj_st wbr stress-sum-em · 6.40mm/px · 6 of 64 frames shown]
[frame 6/64]
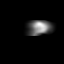
[frame 16/64]
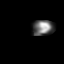
[frame 27/64]
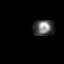
[frame 38/64]
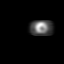
[frame 48/64]
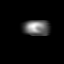
[frame 59/64]
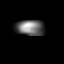

[Series 2: wbr stress-sum-em · 6.40mm/px · 6 of 64 frames shown]
[frame 6/64]
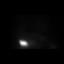
[frame 16/64]
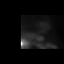
[frame 27/64]
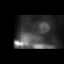
[frame 38/64]
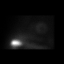
[frame 48/64]
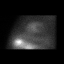
[frame 59/64]
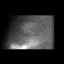

[Series 3: wbr_r-proj_st wbr rest · 6.40mm/px · 6 of 64 frames shown]
[frame 6/64]
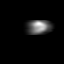
[frame 16/64]
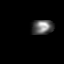
[frame 27/64]
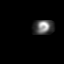
[frame 38/64]
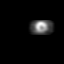
[frame 48/64]
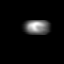
[frame 59/64]
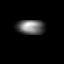

[Series 3: wbr rest · 6.40mm/px · 6 of 64 frames shown]
[frame 6/64]
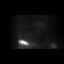
[frame 16/64]
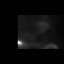
[frame 27/64]
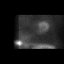
[frame 38/64]
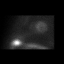
[frame 48/64]
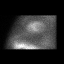
[frame 59/64]
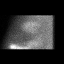

[36 of 36 positions shown; findings below may reference images not displayed]

Canned report from images found in remote index.

Refer to host system for actual result text.

## 2016-05-11 MED ORDER — TECHNETIUM TC 99M TETROFOSMIN IV KIT
30.5000 | PACK | Freq: Once | INTRAVENOUS | Status: AC | PRN
  Administered 2016-05-11: 31 via INTRAVENOUS
  Filled 2016-05-11: qty 31

## 2016-05-11 MED ORDER — REGADENOSON 0.4 MG/5ML IV SOLN
0.4000 mg | Freq: Once | INTRAVENOUS | Status: AC
  Administered 2016-05-11: 0.4 mg via INTRAVENOUS

## 2016-05-12 ENCOUNTER — Ambulatory Visit (HOSPITAL_COMMUNITY)
Admission: RE | Admit: 2016-05-12 | Discharge: 2016-05-12 | Disposition: A | Discharge status: Home / Self Care | Payer: Medicare Other | Source: Ambulatory Visit | Attending: Cardiovascular Disease | Admitting: Cardiovascular Disease

## 2016-05-12 LAB — MYOCARDIAL PERFUSION IMAGING
CHL CUP NUCLEAR SSS: 8
CHL CUP RESTING HR STRESS: 72 {beats}/min
LVDIAVOL: 104 mL (ref 62–150)
LVSYSVOL: 52 mL
Peak HR: 93 {beats}/min
SDS: 3
SRS: 5
TID: 1.07

## 2016-05-12 MED ORDER — TECHNETIUM TC 99M TETROFOSMIN IV KIT
31.5000 | PACK | Freq: Once | INTRAVENOUS | Status: AC | PRN
  Administered 2016-05-12: 31.5 via INTRAVENOUS

## 2016-05-23 ENCOUNTER — Telehealth: Payer: Self-pay | Admitting: *Deleted

## 2016-05-23 NOTE — Telephone Encounter (Signed)
Pt is having left robot assisted laparoscopic partial nephrectomy and the okay to stop aspirin. Per dr Stanford Breed, okay for surgery; okay to hold aspirin 7-10 days prior to surgery and then resume after.

## 2016-05-30 ENCOUNTER — Ambulatory Visit: Payer: Medicare Other | Admitting: Cardiology

## 2016-06-01 ENCOUNTER — Inpatient Hospital Stay (HOSPITAL_COMMUNITY): Payer: Medicare Other

## 2016-06-01 ENCOUNTER — Encounter (HOSPITAL_COMMUNITY)
Admission: RE | Admit: 2016-06-01 | Discharge: 2016-06-01 | Disposition: A | Discharge status: Home / Self Care | Payer: Medicare Other | Source: Ambulatory Visit | Attending: Urology | Admitting: Urology

## 2016-06-01 ENCOUNTER — Encounter (HOSPITAL_COMMUNITY): Payer: Self-pay

## 2016-06-01 DIAGNOSIS — Z01818 Encounter for other preprocedural examination: Secondary | ICD-10-CM | POA: Diagnosis not present

## 2016-06-01 DIAGNOSIS — R938 Abnormal findings on diagnostic imaging of other specified body structures: Secondary | ICD-10-CM | POA: Diagnosis not present

## 2016-06-01 DIAGNOSIS — R9389 Abnormal findings on diagnostic imaging of other specified body structures: Secondary | ICD-10-CM

## 2016-06-01 HISTORY — DX: Reserved for inherently not codable concepts without codable children: IMO0001

## 2016-06-01 HISTORY — DX: Unspecified osteoarthritis, unspecified site: M19.90

## 2016-06-01 HISTORY — DX: Personal history of other specified conditions: Z87.898

## 2016-06-01 HISTORY — DX: Depression, unspecified: F32.A

## 2016-06-01 HISTORY — DX: Major depressive disorder, single episode, unspecified: F32.9

## 2016-06-01 HISTORY — DX: Personal history of urinary (tract) infections: Z87.440

## 2016-06-01 HISTORY — DX: Personal history of irradiation: Z92.3

## 2016-06-01 HISTORY — DX: Carpal tunnel syndrome, unspecified upper limb: G56.00

## 2016-06-01 HISTORY — DX: Presence of spectacles and contact lenses: Z97.3

## 2016-06-01 LAB — BASIC METABOLIC PANEL
ANION GAP: 7 (ref 5–15)
BUN: 16 mg/dL (ref 6–20)
CHLORIDE: 107 mmol/L (ref 101–111)
CO2: 27 mmol/L (ref 22–32)
Calcium: 8.8 mg/dL — ABNORMAL LOW (ref 8.9–10.3)
Creatinine, Ser: 0.91 mg/dL (ref 0.61–1.24)
GFR calc Af Amer: >60 mL/min (ref >60)
GLUCOSE: 103 mg/dL — AB (ref 65–99)
POTASSIUM: 4 mmol/L (ref 3.5–5.1)
Sodium: 141 mmol/L (ref 135–145)

## 2016-06-01 LAB — ABO/RH: ABO/RH(D): O NEG

## 2016-06-01 LAB — CBC
HEMATOCRIT: 41.9 % (ref 39.0–52.0)
HEMOGLOBIN: 14.4 g/dL (ref 13.0–17.0)
MCH: 31.3 pg (ref 26.0–34.0)
MCHC: 34.4 g/dL (ref 30.0–36.0)
MCV: 91.1 fL (ref 78.0–100.0)
Platelets: 175 10*3/uL (ref 150–400)
RBC: 4.6 MIL/uL (ref 4.22–5.81)
RDW: 13.1 % (ref 11.5–15.5)
WBC: 5.8 10*3/uL (ref 4.0–10.5)

## 2016-06-01 IMAGING — CR DG CHEST 2V
2 series · 2 of 2 positions shown · non-contrast
Comparison: [DATE], [DATE]

CLINICAL DATA: Pre Op ; Robotic Partial Nephrectomy;no chest
complaints; non- smoker; HTN-on meds; Hx heart catheterization

EXAM:
CHEST  2 VIEW

[w chest pa *]
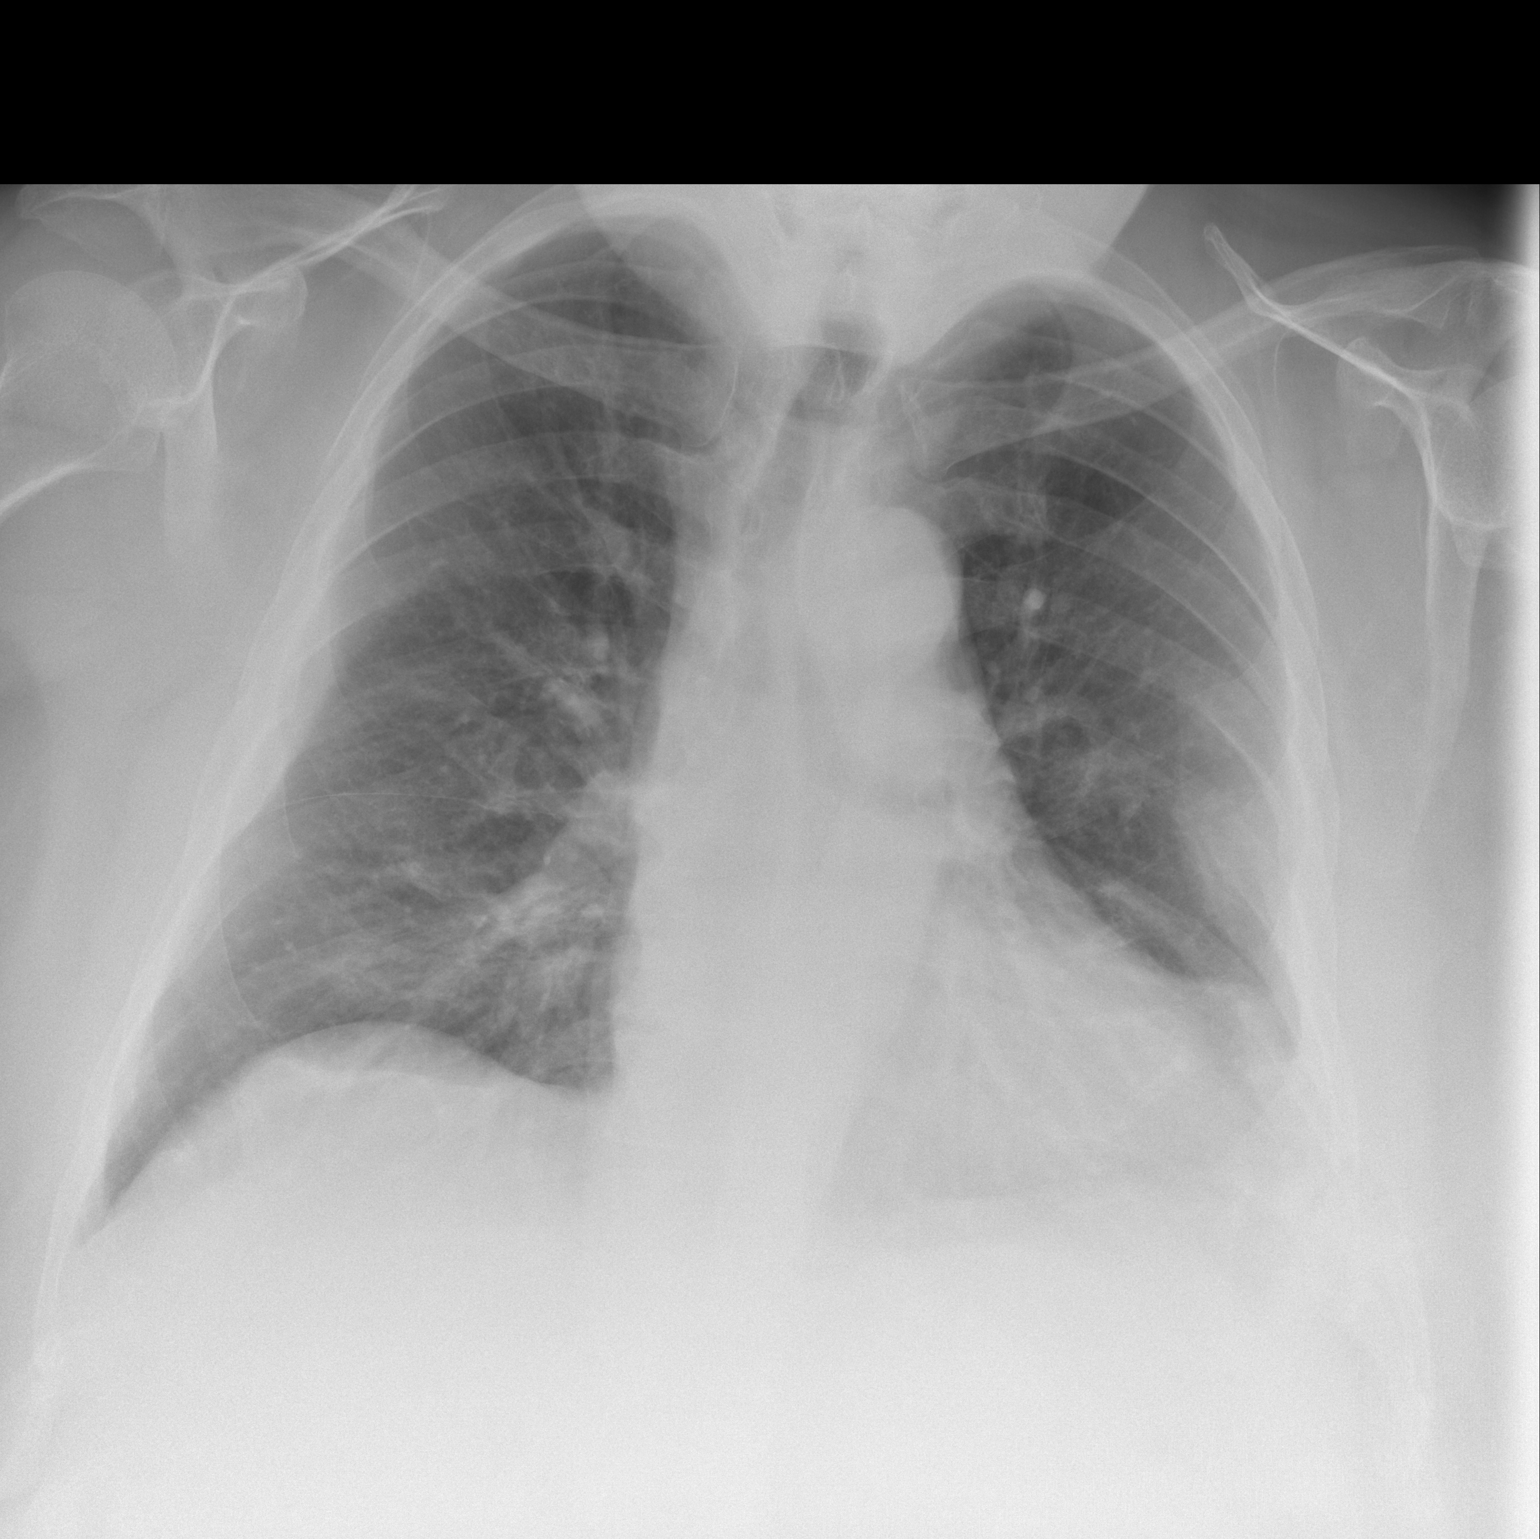

[w chest lat *]
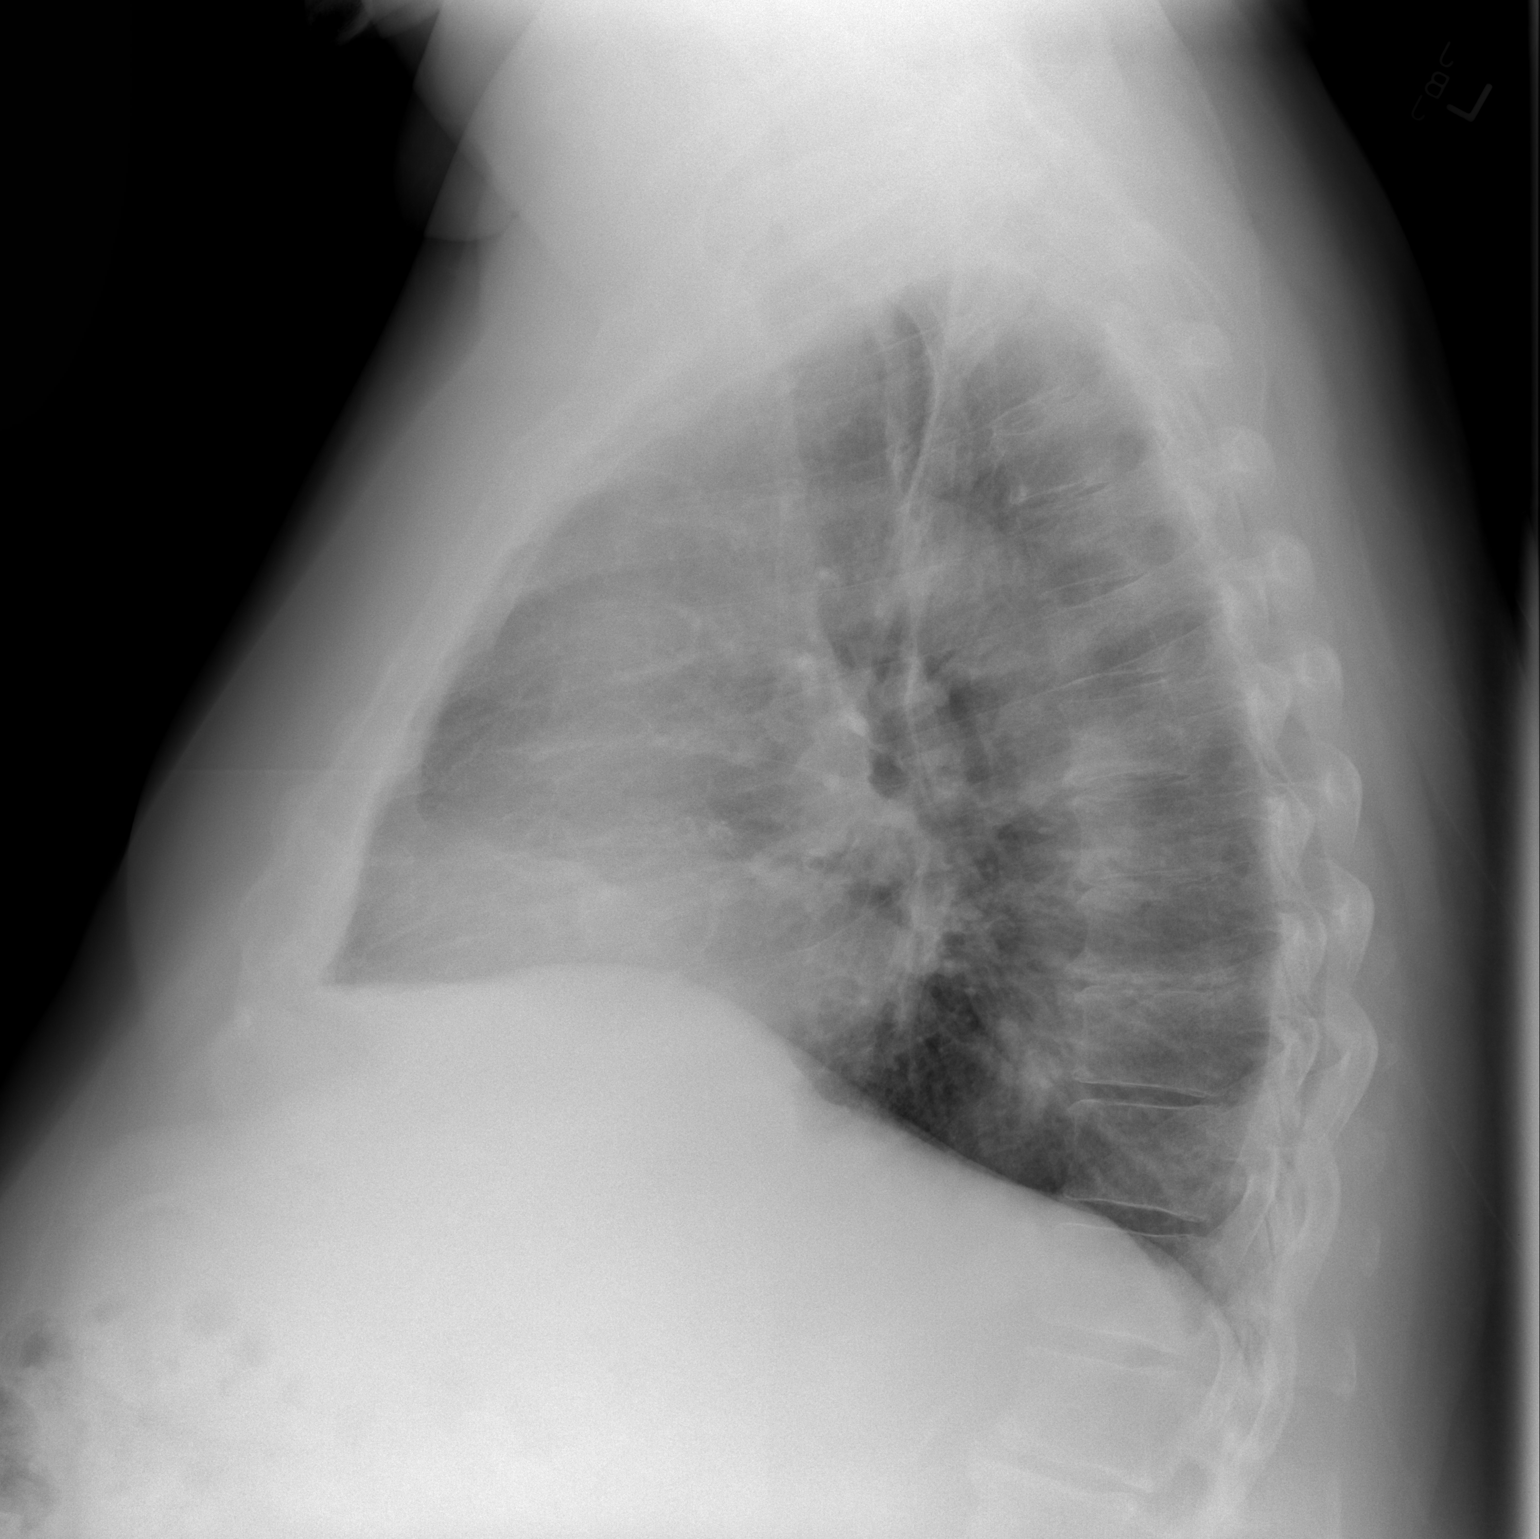

[2 of 2 positions shown; findings below may reference images not displayed]

FINDINGS: Heart is mildly enlarged. There are no focal consolidations. No
definite pleural effusions. Mild pleural thickening noted on the
left. Remote rib fractures present. No pulmonary edema.
IMPRESSION: 1. Cardiomegaly.
2. Pleural changes on the left appear stable.

## 2016-06-01 NOTE — Patient Instructions (Signed)
Sean [name]  06/01/2016   Your procedure is scheduled on: Monday June 06, 2016  Report to [facility] Main  Entrance take [place]  elevators to 3rd floor to  [place] at 9:00 AM.  Call this number if you have problems the morning of surgery [phone]   Remember: ONLY 1 PERSON MAY GO WITH YOU TO SHORT STAY TO GET  READY MORNING OF [name]  Do not eat food or drink liquids :After Midnight.     Take these medicines the morning of surgery with A SIP OF WATER: Metoprolol                                You may not have any metal on your body including hair pins and              piercings  Do not wear jewelry, lotions, powders or colognes, deodorant                           Men may shave face and neck.   Do not bring valuables to the hospital. [place].  Contacts, dentures or bridgework may not be worn into surgery.  Leave suitcase in the car. After surgery it may be brought to your room.     Special Instructions: FOLLOW SURGEON'S INSTRUCTION IN REGARDS TO BOWEL PREPARATION PRIOR TO SURGICAL DATE               Please read over the following fact sheets you were given:INCENTIVE SPIROMETER; BLOOD TRANSFUSION INFORMATION SHEET  _____________________________________________________________________             [facility] - Preparing for Surgery Before surgery, you can play an important role.  Because skin is not sterile, your skin needs to be as free of germs as possible.  You can reduce the number of germs on your skin by washing with CHG (chlorahexidine gluconate) soap before surgery.  CHG is an antiseptic cleaner which kills germs and bonds with the skin to continue killing germs even after washing. Please DO NOT use if you have an allergy to CHG or antibacterial soaps.  If your skin becomes reddened/irritated stop using the CHG and inform your nurse when you arrive at Short Stay. Do not shave  (including legs and underarms) for at least 48 hours prior to the first CHG shower.  You may shave your face/neck. Please follow these instructions carefully:  1.  Shower with CHG Soap the night before surgery and the  morning of Surgery.  2.  If you choose to wash your hair, wash your hair first as usual with your  normal  shampoo.  3.  After you shampoo, rinse your hair and body thoroughly to remove the  shampoo.                           4.  Use CHG as you would any other liquid soap.  You can apply chg directly  to the skin and wash                       Gently with a scrungie  or clean washcloth.  5.  Apply the CHG Soap to your body ONLY FROM THE NECK DOWN.   Do not use on face/ open                           Wound or open sores. Avoid contact with eyes, ears mouth and genitals (private parts).                       Wash face,  Genitals (private parts) with your normal soap.             6.  Wash thoroughly, paying special attention to the area where your surgery  will be performed.  7.  Thoroughly rinse your body with warm water from the neck down.  8.  DO NOT shower/wash with your normal soap after using and rinsing off  the CHG Soap.                9.  Pat yourself dry with a clean towel.            10.  Wear clean pajamas.            11.  Place clean sheets on your bed the night of your first shower and do not  sleep with pets. Day of Surgery : Do not apply any lotions/deodorants the morning of surgery.  Please wear clean clothes to the hospital/surgery center.  FAILURE TO FOLLOW THESE INSTRUCTIONS MAY RESULT IN THE CANCELLATION OF YOUR SURGERY PATIENT SIGNATURE_________________________________  NURSE SIGNATURE__________________________________  ________________________________________________________________________   Sean [name]  An incentive spirometer is a tool that can help keep your lungs clear and active. This tool measures how well you are filling your lungs with  each breath. Taking long deep breaths may help reverse or decrease the chance of developing breathing (pulmonary) problems (especially infection) following:  A long period of time when you are unable to move or be active. BEFORE THE PROCEDURE   If the spirometer includes an indicator to show your best effort, your nurse or respiratory therapist will set it to a desired goal.  If possible, sit up straight or lean slightly forward. Try not to slouch.  Hold the incentive spirometer in an upright position. INSTRUCTIONS FOR USE   Sit on the edge of your bed if possible, or sit up as far as you can in bed or on a chair.  Hold the incentive spirometer in an upright position.  Breathe out normally.  Place the mouthpiece in your mouth and seal your lips tightly around it.  Breathe in slowly and as deeply as possible, raising the piston or the ball toward the top of the column.  Hold your breath for 3-5 seconds or for as long as possible. Allow the piston or ball to fall to the bottom of the column.  Remove the mouthpiece from your mouth and breathe out normally.  Rest for a few seconds and repeat Steps 1 through 7 at least 10 times every 1-2 hours when you are awake. Take your time and take a few normal breaths between deep breaths.  The spirometer may include an indicator to show your best effort. Use the indicator as a goal to work toward during each repetition.  After each set of 10 deep breaths, practice coughing to be sure your lungs are clear. If you have an incision (the cut made at the time of surgery), support your incision when  coughing by placing a pillow or rolled up towels firmly against it. Once you are able to get out of bed, walk around indoors and cough well. You may stop using the incentive spirometer when instructed by your caregiver.  RISKS AND COMPLICATIONS  Take your time so you do not get dizzy or light-headed.  If you are in pain, you may need to take or ask for  pain medication before doing incentive spirometry. It is harder to take a deep breath if you are having pain. AFTER USE  Rest and breathe slowly and easily.  It can be helpful to keep track of a log of your progress. Your caregiver can provide you with a simple table to help with this. If you are using the spirometer at home, follow these instructions: [place] IF:   You are having difficultly using the spirometer.  You have trouble using the spirometer as often as instructed.  Your pain medication is not giving enough relief while using the spirometer.  You develop fever of 100.5 F (38.1 C) or higher. SEEK IMMEDIATE MEDICAL CARE IF:   You cough up bloody sputum that had not been present before.  You develop fever of 102 F (38.9 C) or greater.  You develop worsening pain at or near the incision site. MAKE SURE YOU:   Understand these instructions.  Will watch your condition.  Will get help right away if you are not doing well or get worse. Document Released: 03/20/2007 Document Revised: 01/30/2012 Document Reviewed: 05/21/2007 ExitCare Patient Information 2014 Exit[employer], [place].   ________________________________________________________________________  WHAT IS A BLOOD TRANSFUSION? Blood Transfusion Information  A transfusion is the replacement of blood or some of its parts. Blood is made up of multiple cells which provide different functions.  Red blood cells carry oxygen and are used for blood loss replacement.  White blood cells fight against infection.  Platelets control bleeding.  Plasma helps clot blood.  Other blood products are available for specialized needs, such as hemophilia or other clotting disorders. BEFORE THE TRANSFUSION  Who gives blood for transfusions?   Healthy volunteers who are fully evaluated to make sure their blood is safe. This is blood bank blood. Transfusion therapy is the safest it has ever been in the practice of medicine.  Before blood is taken from a donor, a complete history is taken to make sure that person has no history of diseases nor engages in risky social behavior (examples are intravenous drug use or sexual activity with multiple partners). The donor's travel history is screened to minimize risk of transmitting infections, such as malaria. The donated blood is tested for signs of infectious diseases, such as HIV and hepatitis. The blood is then tested to be sure it is compatible with you in order to minimize the chance of a transfusion reaction. If you or a relative donates blood, this is often done in anticipation of surgery and is not appropriate for emergency situations. It takes many days to process the donated blood. RISKS AND COMPLICATIONS Although transfusion therapy is very safe and saves many lives, the main dangers of transfusion include:   Getting an infectious disease.  Developing a transfusion reaction. This is an allergic reaction to something in the blood you were given. Every precaution is taken to prevent this. The decision to have a blood transfusion has been considered carefully by your caregiver before blood is given. Blood is not given unless the benefits outweigh the risks. AFTER THE TRANSFUSION  Right after receiving  a blood transfusion, you will usually feel much better and more energetic. This is especially true if your red blood cells have gotten low (anemic). The transfusion raises the level of the red blood cells which carry oxygen, and this usually causes an energy increase.  The nurse administering the transfusion will monitor you carefully for complications. HOME CARE INSTRUCTIONS  No special instructions are needed after a transfusion. You may find your energy is better. Speak with your caregiver about any limitations on activity for underlying diseases you may have. SEEK MEDICAL CARE IF:   Your condition is not improving after your transfusion.  You develop redness or  irritation at the intravenous (IV) site. SEEK IMMEDIATE MEDICAL CARE IF:  Any of the following symptoms occur over the next 12 hours:  Shaking chills.  You have a temperature by mouth above 102 F (38.9 C), not controlled by medicine.  Chest, back, or muscle pain.  People around you feel you are not acting correctly or are confused.  Shortness of breath or difficulty breathing.  Dizziness and fainting.  You get a rash or develop hives.  You have a decrease in urine output.  Your urine turns a dark color or changes to pink, red, or brown. Any of the following symptoms occur over the next 10 days:  You have a temperature by mouth above 102 F (38.9 C), not controlled by medicine.  Shortness of breath.  Weakness after normal activity.  The white part of the eye turns yellow (jaundice).  You have a decrease in the amount of urine or are urinating less often.  Your urine turns a dark color or changes to pink, red, or brown. Document Released: 11/04/2000 Document Revised: 01/30/2012 Document Reviewed: 06/23/2008 [facility] Patient Information 2014 [place], [place].  _______________________________________________________________________

## 2016-06-01 NOTE — Progress Notes (Signed)
Your patient has screened at an elevated risk for Obstructive Sleep Apnea using the Stop-Bang Tool during a pre-surgical visit. Patient scored at high risk.  

## 2016-06-03 NOTE — H&P (Addendum)
Chief Complaint Left renal neoplasm  Reason For Visit Reason for consult: To discuss minimally invasive nephron sparing surgery for his left renal neoplasm. Physician requesting consult: Dr. Franchot Gallo PCP: Dr. Shon Baton  History of Present Illness Mr. 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 Ledon Snare completed in January 2012. He has been followed by Dr. Diona Fanti and has remained disease free as of April 2017. He has had a recurrent urethral stricture treated twice with balloon dilation (most recently in May 2014). He recently underwent a CT of the chest after suffering left posterior 9th and 10th rib fractures that also incidentally detected a concerning left renal neoplasm as well as an indeterminate 6 x 8 mm right middle lobe nodule. A dedicated CT of the abdomen with and without IV contrast confirmed a 5.3 cm enhancing exophytic left renal mass with central necrosis of the lateral upper pole of the left kidney. He was also noted to have a large 13 cm simple appearing cyst of the right kidney. There was no evidence of regional lymphadenopathy, adrenal masses, renal vein/IVC involvement, or metastatic disease to the abdomen.  He has not any hematuria, unintentional weight loss, night sweats, or other abdominal pain complaints. He has no family history of kidney cancer. He does have a history of coronary artery disease and had previously been followed by cardiology. His cardiologist is Dr. Kirk Ruths. He denies any recent chest pain, dyspnea, or palpitations. It has been 3 or 4 years since his last stress test according to the patient.  Past Medical History Problems  1. History of Coronary artery disease (I25.10) 2. History of Gout 3. History of Gross hematuria (R31.0) 4. History of hypercholesterolemia (Z86.39) 5. History  of hypertension (Z86.79) 6. History of malignant neoplasm of prostate (Z85.46) 7. History of urethral stricture (Z87.448) 8. History of urethral stricture (S17.793) Surgical History Problems  1. History of Cystoscopy For Urethral Stricture 2. History of Cystoscopy For Urethral Stricture 3. Denied: History of Radiation Therapy 4. History of Renal Lithotripsy 5. History of Surgery Prostate Transperineal Placement Of Needles Current Meds 1. Allopurinol 100 MG Oral Tablet; TAKE 2 TABLET Daily; Therapy: (Recorded:05Jan2015) to Recorded 2. Aspirin 81 MG TABS; takes every now and then-not daily; Therapy: (Recorded:13Jul2015) to Recorded 3. Cinnamon TABS; Therapy: (Recorded:24Apr2017) to Recorded 4. Ginger CAPS; Therapy: (Recorded:24Apr2017) to Recorded 5. Lipitor 80 MG Oral Tablet; Therapy: (Recorded:18Jul2011) to Recorded 6. Metoprolol Succinate ER 50 MG Oral Tablet Extended Release 24 Hour; TAKE 1.5 TABLET Daily; Therapy: [id number] to (Evaluate:26May2014) Recorded 7. Multi-Vitamin TABS; Therapy: (Recorded:18Jul2011) to Recorded 8. Ramipril 10 MG Oral Capsule; Therapy: (Recorded:18Jul2011) to Recorded 9. Tart Cherry Advanced CAPS; Therapy: (Recorded:24Apr2017) to Recorded 10. Turmeric CAPS; Therapy: (Recorded:24Apr2017) to Recorded 11. Vitamin C TABS; Therapy: (Recorded:18Jul2011) to Recorded Allergies Medication  1. Codeine Derivatives 2. Hydrocodone-Acetaminophen CAPS 3. Uribel CAPS Family History Problems  1. Family history of Acute Myocardial Infarction : Father 2. Family history of Death In The Family Father  deceased age 64--MI 60. Family history of Nephrolithiasis : Brother Social History Problems   Denied: History of Alcohol Use (History)  Denied: History of Caffeine Use  Marital History - Single  Never A Smoker  Retired From Work  Review of Systems Genitourinary, constitutional, skin, eye, otolaryngeal, hematologic/lymphatic, cardiovascular, pulmonary,  endocrine, musculoskeletal, gastrointestinal, neurological and psychiatric system(s) were reviewed and pertinent findings if present are noted and are otherwise negative.  Genitourinary: no hematuria.  Constitutional: no recent weight loss.  Cardiovascular: no chest pain.  Respiratory: no shortness of breath and no shortness of breath during exertion.  Vitals  Weight: 300 lb  BMI Calculated: 43.05 BSA Calculated: 2.48  Physical Exam Constitutional: Well nourished and well developed . No acute distress.  ENT:. The ears and nose are normal in appearance.  Neck: The appearance of the neck is normal and no neck mass is present.  Pulmonary: No respiratory distress, normal respiratory rhythm and effort and clear bilateral breath sounds.  Cardiovascular: Heart rate and rhythm are normal . No peripheral edema.  Abdomen: The abdomen is obese. The abdomen is soft and nontender. No masses are palpated. No CVA tenderness. No hernias are palpable. No hepatosplenomegaly noted.  Lymphatics: The supraclavicular, femoral and inguinal nodes are not enlarged or tender.  Skin: Normal skin turgor, no visible rash and no visible skin lesions.  Neuro/Psych:. Mood and affect are appropriate.  Results/Data Selected Results   BUN & CREATININE [id number] 01:57PM Franchot Gallo  SPECIMEN TYPE: BLOOD  Test Name Result Flag Reference  CREATININE 1.00 mg/dL  0.50-1.50  BUN 16 mg/dL  6-30  Est GFR, African American 88 mL/min  >=60  Est GFR, NonAfrican American 76 mL/min  >=60  PERFORMED AT: [facility] [address]. [place], [place] [address] THE ESTIMATED GFR IS A CALCULATION VALID FOR ADULTS (>=23 YEARS OLD) THAT USES THE CKD-EPI ALGORITHM TO ADJUST FOR AGE AND SEX. IT IS  NOT TO BE USED FOR CHILDREN, PREGNANT WOMEN, HOSPITALIZED PATIENTS,  PATIENTS ON DIALYSIS, OR WITH RAPIDLY CHANGING KIDNEY FUNCTION. ACCORDING TO THE NKDEP, EGFR >89 IS NORMAL, 60-89 SHOWS MILD IMPAIRMENT, 30-59 SHOWS  MODERATE IMPAIRMENT, 15-29 SHOWS SEVERE IMPAIRMENT AND <15 IS ESRD.  CT-ABD/PELVIS W/W/O CONTRAST [id number] 12:00AM Franchot Gallo  Test Name Result Flag Reference  CT-ABD/PELVIS W/W/O CONTRAST (Report)    ** RADIOLOGY REPORT BY [facility] ** CLINICAL DATA: Left renal mass partially visualized on recent chest CT. History of prostate cancer diagnosed in 2011, treated with radiation therapy. History of nephrolithiasis. EXAM: CT ABDOMEN AND PELVIS WITHOUT AND WITH CONTRAST TECHNIQUE: Multidetector CT imaging of the abdomen and pelvis was performed following the standard protocol before and following the bolus administration of intravenous contrast. CONTRAST: 125 cc Isovue-300 IV. COMPARISON: 03/18/2016 chest CT. FINDINGS: Lower chest: Stable small layering left pleural effusion. Mild compressive atelectasis in the dependent left lower lobe. Coronary atherosclerosis. Hepatobiliary: Mild diffuse hepatic steatosis. No liver masses. Normal gallbladder with no radiopaque cholelithiasis. No biliary ductal dilatation. Pancreas: Normal, with no mass or duct dilation. Spleen: Normal size. No mass. Adrenals/Urinary Tract: Normal adrenals. Nonobstructing 2 mm upper and 5 mm lower right renal stones. Nonobstructing 3 mm upper and 1 mm lower left renal stones. No hydronephrosis. Normal caliber ureters, with no ureteral stones. No bladder stones. Exophytic 13.0 cm simple renal cyst in the lower right kidney. 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 The irregular margins peripherally are suggestive of perinephric fat invasion, although the mass appears contain by Gerota's fascia. No renal sinus or collecting system invasion. Renal veins are patent without tumor  thrombus. No bladder wall  thickening or bladder mass. Stomach/Bowel: Grossly normal stomach. Normal caliber small bowel with no small bowel wall thickening. Normal appendix. Normal large bowel with no diverticulosis, large bowel wall thickening or pericolonic fat stranding. Vascular/Lymphatic: Atherosclerotic ectatic infrarenal abdominal aorta, maximum diameter 2.5 cm. Patent portal, splenic, hepatic and renal veins. No pathologically enlarged lymph nodes in the abdomen or pelvis. Reproductive: Normal size prostate contains numerous brachytherapy seeds. Other: No pneumoperitoneum, ascites or focal fluid collection. Musculoskeletal: No aggressive appearing focal osseous lesions. Partially visualized apparent lateral left eighth rib resection presumably from prior thoracotomy. Subacute healing nondisplaced left posterior ninth and tenth rib fractures. Small fat containing right inguinal hernia. Moderate degenerative changes in the visualized thoracolumbar spine. IMPRESSION: 1. 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 Possible perinephric fat invasion by the mass, which appears to be contained by Gerota's fascia. Patent renal veins with no tumor thrombus. 2. No evidence of metastatic disease in the abdomen or pelvis. 3. Nonobstructing stones in both kidneys. 4. Large exophytic simple 13.0 cm renal cyst in the lower right kidney. 5. Stable small layering left pleural effusion. Subacute healing nondisplaced left posterior ninth and tenth rib fractures . 6. Additional findings include coronary atherosclerosis, mild diffuse hepatic steatosis and small fat containing right inguinal hernia. Electronically Signed By: Ilona Sorrel M.D. On: 03/21/2016 15:23    Assessment Assessed  1. Neoplasm of left kidney (R56.153)  Discussion/Summary 1. Left renal neoplasm concerning for malignancy:  I have recommended proceeding  with a left robot-assisted left prostatic partial nephrectomy. I discussed the potential benefits and risks of the procedure, side effects of the proposed treatment, the likelihood of the patient achieving the goals of the procedure, and any potential problems that might occur during the procedure or recuperation.

## 2016-06-05 MED ORDER — DEXTROSE 5 % IV SOLN
3.0000 g | INTRAVENOUS | Status: AC
  Administered 2016-06-06: 2 g via INTRAVENOUS
  Administered 2016-06-06: 3 g via INTRAVENOUS
  Filled 2016-06-05: qty 3000

## 2016-06-06 ENCOUNTER — Encounter (HOSPITAL_COMMUNITY): Payer: Self-pay | Admitting: Anesthesiology

## 2016-06-06 ENCOUNTER — Inpatient Hospital Stay (HOSPITAL_COMMUNITY): Payer: Medicare Other | Admitting: Registered Nurse

## 2016-06-06 ENCOUNTER — Encounter (HOSPITAL_COMMUNITY)
Admission: RE | Disposition: A | Discharge status: Home / Self Care | Payer: Self-pay | Source: Ambulatory Visit | Attending: Urology

## 2016-06-06 ENCOUNTER — Inpatient Hospital Stay (HOSPITAL_COMMUNITY)
Admission: RE | Admit: 2016-06-06 | Discharge: 2016-06-08 | DRG: 657 | Disposition: A | Discharge status: Home / Self Care | Payer: Medicare Other | Source: Ambulatory Visit | Attending: Urology | Admitting: Urology

## 2016-06-06 DIAGNOSIS — D49512 Neoplasm of unspecified behavior of left kidney: Secondary | ICD-10-CM | POA: Diagnosis not present

## 2016-06-06 DIAGNOSIS — Z8249 Family history of ischemic heart disease and other diseases of the circulatory system: Secondary | ICD-10-CM | POA: Diagnosis not present

## 2016-06-06 DIAGNOSIS — E7800 Pure hypercholesterolemia, unspecified: Secondary | ICD-10-CM | POA: Diagnosis present

## 2016-06-06 DIAGNOSIS — C642 Malignant neoplasm of left kidney, except renal pelvis: Principal | ICD-10-CM | POA: Diagnosis present

## 2016-06-06 DIAGNOSIS — N359 Urethral stricture, unspecified: Secondary | ICD-10-CM | POA: Diagnosis not present

## 2016-06-06 DIAGNOSIS — I2510 Atherosclerotic heart disease of native coronary artery without angina pectoris: Secondary | ICD-10-CM | POA: Diagnosis present

## 2016-06-06 DIAGNOSIS — Z8546 Personal history of malignant neoplasm of prostate: Secondary | ICD-10-CM

## 2016-06-06 DIAGNOSIS — N2889 Other specified disorders of kidney and ureter: Secondary | ICD-10-CM | POA: Diagnosis present

## 2016-06-06 DIAGNOSIS — I10 Essential (primary) hypertension: Secondary | ICD-10-CM | POA: Diagnosis present

## 2016-06-06 DIAGNOSIS — Z6841 Body Mass Index (BMI) 40.0 and over, adult: Secondary | ICD-10-CM | POA: Diagnosis not present

## 2016-06-06 DIAGNOSIS — Z79899 Other long term (current) drug therapy: Secondary | ICD-10-CM

## 2016-06-06 DIAGNOSIS — N358 Other urethral stricture: Secondary | ICD-10-CM | POA: Diagnosis not present

## 2016-06-06 HISTORY — PX: CYSTOSCOPY: SHX5120

## 2016-06-06 HISTORY — PX: ROBOTIC ASSITED PARTIAL NEPHRECTOMY: SHX6087

## 2016-06-06 LAB — HEMOGLOBIN AND HEMATOCRIT, BLOOD
HCT: 39.9 % (ref 39.0–52.0)
Hemoglobin: 13.3 g/dL (ref 13.0–17.0)

## 2016-06-06 LAB — BASIC METABOLIC PANEL
ANION GAP: 9 (ref 5–15)
BUN: 17 mg/dL (ref 6–20)
CALCIUM: 8.2 mg/dL — AB (ref 8.9–10.3)
CO2: 28 mmol/L (ref 22–32)
Chloride: 103 mmol/L (ref 101–111)
Creatinine, Ser: 1.35 mg/dL — ABNORMAL HIGH (ref 0.61–1.24)
GFR calc Af Amer: >60 mL/min (ref >60)
GFR, EST NON AFRICAN AMERICAN: 52 mL/min — AB (ref >60)
GLUCOSE: 165 mg/dL — AB (ref 65–99)
Potassium: 4.1 mmol/L (ref 3.5–5.1)
Sodium: 140 mmol/L (ref 135–145)

## 2016-06-06 LAB — TYPE AND SCREEN
ABO/RH(D): O NEG
ANTIBODY SCREEN: NEGATIVE

## 2016-06-06 SURGERY — NEPHRECTOMY, PARTIAL, ROBOT-ASSISTED
Anesthesia: General | Site: Urethra

## 2016-06-06 MED ORDER — ATORVASTATIN CALCIUM 40 MG PO TABS
80.0000 mg | ORAL_TABLET | Freq: Every evening | ORAL | Status: DC
  Administered 2016-06-06 – 2016-06-07 (×2): 80 mg via ORAL
  Filled 2016-06-06 (×2): qty 2

## 2016-06-06 MED ORDER — DEXAMETHASONE SODIUM PHOSPHATE 10 MG/ML IJ SOLN
INTRAMUSCULAR | Status: AC
  Filled 2016-06-06: qty 1

## 2016-06-06 MED ORDER — MANNITOL 25 % IV SOLN
INTRAVENOUS | Status: DC | PRN
  Administered 2016-06-06: 12.5 g via INTRAVENOUS

## 2016-06-06 MED ORDER — FENTANYL CITRATE (PF) 250 MCG/5ML IJ SOLN
INTRAMUSCULAR | Status: AC
  Filled 2016-06-06: qty 5

## 2016-06-06 MED ORDER — SCOPOLAMINE 1 MG/3DAYS TD PT72
MEDICATED_PATCH | TRANSDERMAL | Status: AC
  Filled 2016-06-06: qty 1

## 2016-06-06 MED ORDER — DEXTROSE-NACL 5-0.45 % IV SOLN
INTRAVENOUS | Status: DC
  Administered 2016-06-06 – 2016-06-07 (×3): via INTRAVENOUS

## 2016-06-06 MED ORDER — DEXAMETHASONE SODIUM PHOSPHATE 10 MG/ML IJ SOLN
INTRAMUSCULAR | Status: DC | PRN
  Administered 2016-06-06: 10 mg via INTRAVENOUS

## 2016-06-06 MED ORDER — ARTIFICIAL TEARS OP OINT
TOPICAL_OINTMENT | OPHTHALMIC | Status: AC
  Filled 2016-06-06: qty 3.5

## 2016-06-06 MED ORDER — PROPOFOL 10 MG/ML IV BOLUS
INTRAVENOUS | Status: AC
  Filled 2016-06-06: qty 20

## 2016-06-06 MED ORDER — ALLOPURINOL 100 MG PO TABS
200.0000 mg | ORAL_TABLET | Freq: Every evening | ORAL | Status: DC
  Administered 2016-06-06 – 2016-06-07 (×2): 200 mg via ORAL
  Filled 2016-06-06 (×2): qty 2

## 2016-06-06 MED ORDER — ONDANSETRON HCL 4 MG/2ML IJ SOLN: 4.0000 mg | INTRAMUSCULAR | Status: DC | PRN

## 2016-06-06 MED ORDER — ONDANSETRON HCL 4 MG/2ML IJ SOLN
INTRAMUSCULAR | Status: AC
  Filled 2016-06-06: qty 2

## 2016-06-06 MED ORDER — CEFAZOLIN IN D5W 1 GM/50ML IV SOLN
INTRAVENOUS | Status: AC
  Filled 2016-06-06: qty 100

## 2016-06-06 MED ORDER — ROCURONIUM BROMIDE 100 MG/10ML IV SOLN
INTRAVENOUS | Status: AC
  Filled 2016-06-06: qty 1

## 2016-06-06 MED ORDER — SUGAMMADEX SODIUM 200 MG/2ML IV SOLN
INTRAVENOUS | Status: DC | PRN
  Administered 2016-06-06: 280 mg via INTRAVENOUS

## 2016-06-06 MED ORDER — SCOPOLAMINE 1 MG/3DAYS TD PT72
1.0000 | MEDICATED_PATCH | TRANSDERMAL | Status: DC
  Administered 2016-06-06 (×2): 1.5 mg via TRANSDERMAL
  Filled 2016-06-06: qty 1

## 2016-06-06 MED ORDER — HYDROCODONE-ACETAMINOPHEN 5-325 MG PO TABS: 1.0000 | ORAL_TABLET | Freq: Four times a day (QID) | ORAL | Status: DC | PRN

## 2016-06-06 MED ORDER — DIPHENHYDRAMINE HCL 50 MG/ML IJ SOLN: 12.5000 mg | Freq: Four times a day (QID) | INTRAMUSCULAR | Status: DC | PRN

## 2016-06-06 MED ORDER — ACETAMINOPHEN 10 MG/ML IV SOLN
1000.0000 mg | Freq: Four times a day (QID) | INTRAVENOUS | Status: DC
  Administered 2016-06-06 – 2016-06-07 (×3): 1000 mg via INTRAVENOUS
  Filled 2016-06-06 (×5): qty 100

## 2016-06-06 MED ORDER — PROPOFOL 10 MG/ML IV BOLUS
INTRAVENOUS | Status: DC | PRN
  Administered 2016-06-06: 200 mg via INTRAVENOUS

## 2016-06-06 MED ORDER — BUPIVACAINE LIPOSOME 1.3 % IJ SUSP
20.0000 mL | Freq: Once | INTRAMUSCULAR | Status: AC
  Administered 2016-06-06: 15 mL
  Filled 2016-06-06: qty 20

## 2016-06-06 MED ORDER — FENTANYL CITRATE (PF) 100 MCG/2ML IJ SOLN
INTRAMUSCULAR | Status: DC | PRN
  Administered 2016-06-06: 150 ug via INTRAVENOUS
  Administered 2016-06-06 (×4): 50 ug via INTRAVENOUS

## 2016-06-06 MED ORDER — ONDANSETRON HCL 4 MG/2ML IJ SOLN
INTRAMUSCULAR | Status: DC | PRN
  Administered 2016-06-06: 4 mg via INTRAVENOUS

## 2016-06-06 MED ORDER — GLYCOPYRROLATE 0.2 MG/ML IJ SOLN
INTRAMUSCULAR | Status: AC
  Filled 2016-06-06: qty 1

## 2016-06-06 MED ORDER — SODIUM CHLORIDE 0.9 % IJ SOLN
INTRAMUSCULAR | Status: DC | PRN
  Administered 2016-06-06: 20 mL

## 2016-06-06 MED ORDER — MANNITOL 25 % IV SOLN
INTRAVENOUS | Status: AC
  Filled 2016-06-06: qty 100

## 2016-06-06 MED ORDER — HYDROMORPHONE HCL 1 MG/ML IJ SOLN: 0.5000 mg | INTRAMUSCULAR | Status: DC | PRN

## 2016-06-06 MED ORDER — SUGAMMADEX SODIUM 500 MG/5ML IV SOLN
INTRAVENOUS | Status: AC
  Filled 2016-06-06: qty 5

## 2016-06-06 MED ORDER — SUGAMMADEX SODIUM 200 MG/2ML IV SOLN
INTRAVENOUS | Status: AC
  Filled 2016-06-06: qty 2

## 2016-06-06 MED ORDER — HYDROMORPHONE HCL 1 MG/ML IJ SOLN: 0.2500 mg | INTRAMUSCULAR | Status: DC | PRN

## 2016-06-06 MED ORDER — METOCLOPRAMIDE HCL 5 MG/ML IJ SOLN: 10.0000 mg | Freq: Once | INTRAMUSCULAR | Status: DC | PRN

## 2016-06-06 MED ORDER — MIDAZOLAM HCL 2 MG/2ML IJ SOLN
INTRAMUSCULAR | Status: AC
  Filled 2016-06-06: qty 2

## 2016-06-06 MED ORDER — DIPHENHYDRAMINE HCL 12.5 MG/5ML PO ELIX: 12.5000 mg | ORAL_SOLUTION | Freq: Four times a day (QID) | ORAL | Status: DC | PRN

## 2016-06-06 MED ORDER — DOCUSATE SODIUM 100 MG PO CAPS
100.0000 mg | ORAL_CAPSULE | Freq: Two times a day (BID) | ORAL | Status: DC
  Administered 2016-06-06 – 2016-06-08 (×4): 100 mg via ORAL
  Filled 2016-06-06 (×4): qty 1

## 2016-06-06 MED ORDER — GLYCOPYRROLATE 0.2 MG/ML IJ SOLN
INTRAMUSCULAR | Status: DC | PRN
  Administered 2016-06-06: 0.2 mg via INTRAVENOUS

## 2016-06-06 MED ORDER — MIDAZOLAM HCL 5 MG/5ML IJ SOLN
INTRAMUSCULAR | Status: DC | PRN
  Administered 2016-06-06: 2 mg via INTRAVENOUS

## 2016-06-06 MED ORDER — LACTATED RINGERS IV SOLN
INTRAVENOUS | Status: DC | PRN
  Administered 2016-06-06 (×3): via INTRAVENOUS

## 2016-06-06 MED ORDER — ARTIFICIAL TEARS OP OINT
TOPICAL_OINTMENT | OPHTHALMIC | Status: DC | PRN
  Administered 2016-06-06: 1 via OPHTHALMIC

## 2016-06-06 MED ORDER — LIDOCAINE 2% (20 MG/ML) 5 ML SYRINGE
INTRAMUSCULAR | Status: DC | PRN
  Administered 2016-06-06: 100 mg via INTRAVENOUS

## 2016-06-06 MED ORDER — LACTATED RINGERS IV SOLN
INTRAVENOUS | Status: DC
  Administered 2016-06-06: 18:00:00 via INTRAVENOUS

## 2016-06-06 MED ORDER — FENTANYL CITRATE (PF) 100 MCG/2ML IJ SOLN
INTRAMUSCULAR | Status: AC
  Filled 2016-06-06: qty 2

## 2016-06-06 MED ORDER — CEFAZOLIN IN D5W 1 GM/50ML IV SOLN
1.0000 g | Freq: Three times a day (TID) | INTRAVENOUS | Status: AC
Start: 2016-06-06 — End: 2016-06-07
  Administered 2016-06-06 – 2016-06-07 (×2): 1 g via INTRAVENOUS
  Filled 2016-06-06 (×2): qty 50

## 2016-06-06 MED ORDER — LACTATED RINGERS IR SOLN
Status: DC | PRN
  Administered 2016-06-06: 1000 mL

## 2016-06-06 MED ORDER — ROCURONIUM BROMIDE 100 MG/10ML IV SOLN
INTRAVENOUS | Status: DC | PRN
  Administered 2016-06-06: 20 mg via INTRAVENOUS
  Administered 2016-06-06: 60 mg via INTRAVENOUS
  Administered 2016-06-06: 10 mg via INTRAVENOUS

## 2016-06-06 MED ORDER — SCOPOLAMINE 1 MG/3DAYS TD PT72
1.0000 | MEDICATED_PATCH | TRANSDERMAL | Status: DC
Start: 2016-06-09 — End: 2016-06-06

## 2016-06-06 MED ORDER — LIDOCAINE HCL (CARDIAC) 20 MG/ML IV SOLN
INTRAVENOUS | Status: AC
  Filled 2016-06-06: qty 5

## 2016-06-06 MED ORDER — METOPROLOL SUCCINATE ER 50 MG PO TB24
75.0000 mg | ORAL_TABLET | Freq: Every day | ORAL | Status: DC
  Administered 2016-06-07 – 2016-06-08 (×2): 75 mg via ORAL
  Filled 2016-06-06 (×2): qty 1

## 2016-06-06 MED ORDER — STERILE WATER FOR IRRIGATION IR SOLN
Status: DC | PRN
  Administered 2016-06-06: 1000 mL

## 2016-06-06 MED ORDER — MEPERIDINE HCL 50 MG/ML IJ SOLN: 6.2500 mg | INTRAMUSCULAR | Status: DC | PRN

## 2016-06-06 MED ORDER — SODIUM CHLORIDE 0.9 % IJ SOLN
INTRAMUSCULAR | Status: AC
  Filled 2016-06-06: qty 20

## 2016-06-06 SURGICAL SUPPLY — 55 items
APL ESCP 34 STRL LF DISP (HEMOSTASIS) ×2
APPLICATOR SURGIFLO ENDO (HEMOSTASIS) ×1 IMPLANT
BAG SPEC RTRVL LRG 6X4 10 (ENDOMECHANICALS) ×2
CATH COUDE 5CC RIBBED (CATHETERS) IMPLANT
CATH RIBBED COUDE 5CC (CATHETERS) ×6
CHLORAPREP W/TINT 26ML (MISCELLANEOUS) ×3 IMPLANT
CLIP LIGATING HEM O LOK PURPLE (MISCELLANEOUS) ×3 IMPLANT
CLIP LIGATING HEMO O LOK GREEN (MISCELLANEOUS) ×5 IMPLANT
COVER SURGICAL LIGHT HANDLE (MISCELLANEOUS) ×3 IMPLANT
COVER TIP SHEARS 8 DVNC (MISCELLANEOUS) ×2 IMPLANT
COVER TIP SHEARS 8MM DA VINCI (MISCELLANEOUS) ×1
DECANTER SPIKE VIAL GLASS SM (MISCELLANEOUS) ×3 IMPLANT
DRAIN CHANNEL 15F RND FF 3/16 (WOUND CARE) ×3 IMPLANT
DRAPE ARM DVNC X/XI (DISPOSABLE) ×8 IMPLANT
DRAPE COLUMN DVNC XI (DISPOSABLE) ×2 IMPLANT
DRAPE DA VINCI XI ARM (DISPOSABLE) ×4
DRAPE DA VINCI XI COLUMN (DISPOSABLE) ×1
DRAPE INCISE IOBAN 66X45 STRL (DRAPES) ×3 IMPLANT
DRAPE SHEET LG 3/4 BI-LAMINATE (DRAPES) ×4 IMPLANT
ELECT PENCIL ROCKER SW 15FT (MISCELLANEOUS) ×3 IMPLANT
ELECT REM PT RETURN 9FT ADLT (ELECTROSURGICAL) ×3
ELECTRODE REM PT RTRN 9FT ADLT (ELECTROSURGICAL) ×2 IMPLANT
EVACUATOR SILICONE 100CC (DRAIN) ×3 IMPLANT
GLOVE BIO SURGEON STRL SZ 6.5 (GLOVE) ×3 IMPLANT
GLOVE BIOGEL M STRL SZ7.5 (GLOVE) ×6 IMPLANT
GOWN STRL REUS W/TWL LRG LVL3 (GOWN DISPOSABLE) ×9 IMPLANT
GUIDEWIRE STR DUAL SENSOR (WIRE) ×1 IMPLANT
HEMOSTAT SURGICEL 4X8 (HEMOSTASIS) ×1 IMPLANT
KIT BASIN OR (CUSTOM PROCEDURE TRAY) ×3 IMPLANT
LIQUID BAND (GAUZE/BANDAGES/DRESSINGS) ×6 IMPLANT
POSITIONER SURGICAL ARM (MISCELLANEOUS) ×6 IMPLANT
POUCH SPECIMEN RETRIEVAL 10MM (ENDOMECHANICALS) ×3 IMPLANT
SEAL CANN UNIV 5-8 DVNC XI (MISCELLANEOUS) ×8 IMPLANT
SEAL XI 5MM-8MM UNIVERSAL (MISCELLANEOUS) ×4
SET TUBE IRRIG SUCTION NO TIP (IRRIGATION / IRRIGATOR) ×3 IMPLANT
SOLUTION ELECTROLUBE (MISCELLANEOUS) ×3 IMPLANT
SURGIFLO W/THROMBIN 8M KIT (HEMOSTASIS) ×3 IMPLANT
SUT ETHILON 3 0 PS 1 (SUTURE) ×3 IMPLANT
SUT MNCRL AB 4-0 PS2 18 (SUTURE) ×6 IMPLANT
SUT V-LOC BARB 180 2/0GR6 GS22 (SUTURE) ×3
SUT VIC AB 0 CT1 27 (SUTURE) ×3
SUT VIC AB 0 CT1 27XBRD ANTBC (SUTURE) ×2 IMPLANT
SUT VICRYL 0 UR6 27IN ABS (SUTURE) ×6 IMPLANT
SUT VLOC BARB 180 ABS3/0GR12 (SUTURE) ×6
SUTURE V-LC BRB 180 2/0GR6GS22 (SUTURE) ×2 IMPLANT
SUTURE VLOC BRB 180 ABS3/0GR12 (SUTURE) ×2 IMPLANT
SYR BULB IRRIGATION 50ML (SYRINGE) ×1 IMPLANT
TAPE STRIPS DRAPE STRL (GAUZE/BANDAGES/DRESSINGS) ×3 IMPLANT
TOWEL OR 17X26 10 PK STRL BLUE (TOWEL DISPOSABLE) ×6 IMPLANT
TRAY FOLEY W/METER SILVER 16FR (SET/KITS/TRAYS/PACK) ×3 IMPLANT
TRAY LAPAROSCOPIC (CUSTOM PROCEDURE TRAY) ×3 IMPLANT
TROCAR BLADELESS OPT 5 100 (ENDOMECHANICALS) ×3 IMPLANT
TROCAR XCEL 12X100 BLDLESS (ENDOMECHANICALS) ×3 IMPLANT
TUBING INSUFFLATION 10FT LAP (TUBING) ×1 IMPLANT
WATER STERILE IRR 1500ML POUR (IV SOLUTION) ×6 IMPLANT

## 2016-06-06 NOTE — Anesthesia Postprocedure Evaluation (Signed)
Anesthesia Post Note  Patient: Sean [name]  Procedure(s) Performed: Procedure(s) (LRB): XI ROBOTIC ASSITED PARTIAL NEPHRECTOMY (Left) CYSTOSCOPY FLEXIBLE (N/A)  Patient location during evaluation: PACU Anesthesia Type: General Level of consciousness: awake Pain management: pain level controlled Vital Signs Assessment: post-procedure vital signs reviewed and stable Respiratory status: spontaneous breathing Cardiovascular status: stable Postop Assessment: no signs of nausea or vomiting Anesthetic complications: no    Last Vitals:  Filed Vitals:   06/06/16 1700 06/06/16 1745  BP: 142/80   Pulse: 88   Temp:  36.7 C  Resp: 19     Last Pain:  Filed Vitals:   06/06/16 1746  PainSc: 2                  Rozelle Caudle

## 2016-06-06 NOTE — Discharge Summary (Signed)
Date of admission: 06/06/2016  Date of discharge: 06/08/2016  Admission diagnosis: Left renal mass  Discharge diagnosis: Left renal mass  History and Physical: For full details, please see admission history and physical. Briefly, Sean [name] is a 70 y.o. gentleman with left renal mass.  After discussing management/treatment options, he elected to proceed with surgical treatment.  Hospital Course: ADRIK KHIM [name] was taken to the operating room on 06/06/2016 and underwent a robotic assisted laparoscopic left partial nephrectomy. He tolerated this procedure well and without complications. Postoperatively, he was able to be transferred to a regular hospital room following recovery from anesthesia.  He remained hemodynamically stable overnight. He began ambulating on POD#1. He had excellent urine output with appropriately minimal output from drain and his drain was removed on POD #2. His foley catheter was removed on POD#2 and he was able to void.  He was transitioned to oral pain medication, tolerated a regular diet, and had met all discharge criteria and was able to be discharged home later on POD#2.  Laboratory values:   Recent Labs  06/06/16 1622 06/07/16 0438  HGB 13.3 13.4  HCT 39.9 39.0    Disposition: Home  Discharge instruction:      Discharge Instructions    Discharge instructions    Complete by:  As directed   You can resume aspirin one week after surgery.   Activity:  You are encouraged to ambulate frequently (about every hour during waking hours) to help prevent blood clots from forming in your legs or lungs.  However, you should not engage in any heavy lifting (> 10-15 lbs), strenuous activity, or straining. Diet: You should advance your diet as instructed by your physician.  It will be normal to have some bloating, nausea, and abdominal discomfort intermittently. Prescriptions:  You will be provided a prescription for pain medication to take as needed.  If your pain  is not severe enough to require the prescription pain medication, you may take extra strength Tylenol instead which will have less side effects.  You should also take a prescribed stool softener to avoid straining with bowel movements as the prescription pain medication may constipate you. Incisions: You may remove your dressing bandages 48 hours after surgery if not removed in the hospital.  You will either have some small staples or special tissue glue at each of the incision sites. Once the bandages are removed (if present), the incisions may stay open to air.  You may start showering (but not soaking or bathing in water) the 2nd day after surgery and the incisions simply need to be patted dry after the shower.  No additional care is needed. What to call us about: You should call the office [phone]) if you develop fever > 101 or develop persistent vomiting.           Discharge medications:     Medication List    STOP taking these medications        aspirin EC 81 MG tablet     Cinnamon 500 MG capsule     GINGER PO     multivitamins ther. w/minerals Tabs tablet     TART CHERRY ADVANCED PO     TURMERIC PO     vitamin C 1000 MG tablet      TAKE these medications        allopurinol 100 MG tablet  Commonly known as:  ZYLOPRIM  Take 200 mg by mouth every evening.     atorvastatin 80  MG tablet  Commonly known as:  LIPITOR  Take 80 mg by mouth every evening.     HYDROcodone-acetaminophen 5-325 MG tablet  Commonly known as:  NORCO  Take 1-2 tablets by mouth every 6 (six) hours as needed for moderate pain.     metoprolol succinate 50 MG 24 hr tablet  Commonly known as:  TOPROL-XL  Take 75 mg by mouth every morning. Take with or immediately following a meal.     ramipril 10 MG capsule  Commonly known as:  ALTACE  Take 10 mg by mouth daily.

## 2016-06-06 NOTE — Anesthesia Procedure Notes (Signed)
Procedure Name: Intubation Date/Time: 06/06/2016 11:32 AM Performed by: Talbot Grumbling Pre-anesthesia Checklist: Patient identified, Emergency Drugs available, Suction available and Patient being monitored Patient Re-evaluated:Patient Re-evaluated prior to inductionOxygen Delivery Method: Circle system utilized Preoxygenation: Pre-oxygenation with 100% oxygen Intubation Type: IV induction Ventilation: Mask ventilation without difficulty and Oral airway inserted - appropriate to patient size Laryngoscope Size: Sabra Heck and 2 Grade View: Grade II Tube type: Oral Tube size: 8.0 mm Number of attempts: 1 Airway Equipment and Method: Stylet Placement Confirmation: ETT inserted through vocal cords under direct vision,  positive ETCO2 and breath sounds checked- equal and bilateral Secured at: 22 cm Tube secured with: Tape Dental Injury: Teeth and Oropharynx as per pre-operative assessment

## 2016-06-06 NOTE — Anesthesia Preprocedure Evaluation (Addendum)
Anesthesia Evaluation  Patient identified by MRN, date of birth, ID band Patient awake    Reviewed: Allergy & Precautions, NPO status , Patient's Chart, lab work & pertinent test results, reviewed documented beta blocker date and time   History of Anesthesia Complications (+) PONV and history of anesthetic complications  Airway Mallampati: III  TM Distance: >3 FB Neck ROM: Full    Dental  (+) Caps, Teeth Intact   Pulmonary shortness of breath and with exertion,    Pulmonary exam normal breath sounds clear to auscultation       Cardiovascular hypertension, Pt. on medications and Pt. on home beta blockers + CAD  Normal cardiovascular exam Rhythm:Regular Rate:Normal  12 Lead EKG 05/04/2016- NSR Normal EKG  Myocardial perfusion study 05/12/2016     The left ventricular ejection fraction is mildly decreased (45-54%).     Nuclear stress EF: 50%.     Findings consistent with ischemia.     This is a low risk study.  Small area of mild ischemia in the basal inferior and inferior lateral wall EF 50% with apical hypokinesis    Neuro/Psych PSYCHIATRIC DISORDERS Depression  Neuromuscular disease    GI/Hepatic negative GI ROS, Neg liver ROS,   Endo/Other  Morbid obesity  Renal/GU Left renal neoplasm Hx/o renal cyst Hx/o renal calculi   Hx/o Prostate Ca S/P Radioactive seed implants    Musculoskeletal  (+) Arthritis ,   Abdominal (+) + obese,   Peds  Hematology negative hematology ROS (+)   Anesthesia Other Findings   Reproductive/Obstetrics                           Anesthesia Physical Anesthesia Plan  ASA: III  Anesthesia Plan: General   Post-op Pain Management:    Induction: Intravenous  Airway Management Planned: Oral ETT  Additional Equipment:   Intra-op Plan:   Post-operative Plan: Extubation in OR  Informed Consent: I have reviewed the patients History and Physical,  chart, labs and discussed the procedure including the risks, benefits and alternatives for the proposed anesthesia with the patient or authorized representative who has indicated his/her understanding and acceptance.   Dental advisory given  Plan Discussed with: CRNA, Anesthesiologist and Surgeon  Anesthesia Plan Comments:         Anesthesia Quick Evaluation

## 2016-06-06 NOTE — Op Note (Signed)
Preoperative diagnosis: Left renal mass  Postoperative diagnosis: Left renal mass, Urethral stricture  Procedure:  1. Left robotic-assisted laparoscopic partial nephrectomy 2. Flexible cystoscopy with complex catheter placement  Surgeon: Pryor Curia. M.D.  Assistant(s): Debbrah Alar, PA-C  Resident: Dr. Virginia Crews  Anesthesia: General  Complications: None  EBL: 50 mL  IVF:  2400 mL crystalloid  Specimens: 1. Left renal mass  Disposition of specimens: Pathology  Intraoperative findings:       1. Warm renal ischemia time: 27 minutes  Drains: 1. # 15 Blake perinephric drain  Indication:  Sean [name] is a 70 y.o. year old patient with a left renal mass.  After a thorough review of the management options for their renal mass, they elected to proceed with surgical treatment and the above procedure.  We have discussed the potential benefits and risks of the procedure, side effects of the proposed treatment, the likelihood of the patient achieving the goals of the procedure, and any potential problems that might occur during the procedure or recuperation. Informed consent has been obtained.   Description of procedure:  The patient was taken to the operating room and a general anesthetic was administered. An attempt to place a Foley catheter was unsuccessful by the nursing staff.  I attempted to place an 18 Fr coude catheter that again met resistance.  I then performed flexible cystoscopy under sterile conditions.  There was noted to be a mild urethral stricture.  I was able to manipulate the cystoscope past the stricture into the bladder.  A 16 Fr council catheter was then placed over the wire using seldinger technique. The patient was given preoperative antibiotics, placed in the left modified flank position with care to pad all potential pressure points, and prepped and draped in the usual sterile fashion. Next a preoperative timeout was performed.  A site was  selected in the upper midline.. This was placed using a standard open Hassan technique which allowed entry into the peritoneal cavity under direct vision and without difficulty. A 12 mm port was placed and a pneumoperitoneum established. The camera was then used to inspect the abdomen and there was no evidence of any intra-abdominal injuries or other abnormalities. The remaining abdominal ports were then placed. 8 mm robotic ports were placed in the left upper quadrant, lateral to the umbilicus, left lower quadrant, and far left lateral abdominal wall. All ports were placed under direct vision without difficulty. The surgical cart was then docked.   Utilizing the cautery scissors, the white line of Toldt was incised allowing the colon to be mobilized medially and the plane between the mesocolon and the anterior layer of Gerota's fascia to be developed and the kidney to be exposed.  The ureter and gonadal vein were identified inferiorly and the ureter was lifted anteriorly off the psoas muscle.  Dissection proceeded superiorly along the gonadal vein until the renal vein was identified.  The renal hilum was then carefully isolated with a combination of blunt and sharp dissection allowing the renal arterial and venous structures to be separated and isolated in preparation for renal hilar vessel clamping. There was a single renal artery and two renal veins draining into a single renal vein medially.  12.5 g of IV mannitol was then administered.   Attention turned to the kidney and the perinephric fat surrounding the renal mass was removed and the kidney was mobilized sufficiently for exposure and resection of the renal mass.  There was a large amount of  perinephric fat requiring tedious dissection along with adherent fat on the renal capsule.  Once the renal mass was properly isolated, preparations were made for resection of the tumor.  Reconstructive sutures were placed into the abdomen for the renorrhaphy  portion of the procedure.  The renal artery was then clamped with bulldog clamps.  The tumor was then excised with cold scissor dissection along with an adequate visible gross margin of normal renal parenchyma. The tumor appeared to be excised without any gross violation of the tumor. The renal collecting system was entered during removal of the tumor.  A running 3-0 V-lock suture was then brought through the capsule of the kidney and run along the base of the renal defect to provide hemostasis and close any entry into the renal collecting system if present. Weck clips were used to secure this suture outside the renal capsule at the proximal and distal ends. An additional hemostatic agent (Surgiflo) was then placed into the renal defect. A running 2-0 V lock suture was then used to close the renal capsule using a sliding clip technique which resulted in excellent compression of the renal defect.    The bulldog clamps were then removed from the renal hilar vessel(s) and an additional 12.5 g of IV mannitol was administered. Total warm renal ischemia time was 27 minutes. The renal tumor resection site was examined. Hemostasis appeared adequate.   The kidney was placed back into its normal anatomic position and covered with perinephric fat as needed.  A # 32 Blake drain was then brought through the lateral lower port site and positioned in the perinephric space.  It was secured to the skin with a nylon suture. The surgical cart was undocked.  The renal tumor specimen was removed intact within an endopouch retrieval bag via the midline incision.  This port was closed at the fascial level with 0-vicryl suture.  All other laparoscopic/robotic ports were removed under direct vision and the pneumoperitoneum let down with inspection of the operative field performed and hemostasis again confirmed. All incision sites were then injected with local anesthetic and reapproximated at the skin level with 4-0 monocryl subcuticular  closures.  Dermabond was applied to the skin.  The patient tolerated the procedure well and without complications.  The patient was able to be extubated and transferred to the recovery unit in satisfactory condition.  Pryor Curia MD

## 2016-06-06 NOTE — Transfer of Care (Signed)
Immediate Anesthesia Transfer of Care Note  Patient: Sean [name]  Procedure(s) Performed: Procedure(s): XI ROBOTIC ASSITED PARTIAL NEPHRECTOMY (Left) CYSTOSCOPY FLEXIBLE (N/A)  Patient Location: PACU  Anesthesia Type:General  Level of Consciousness:  sedated, patient cooperative and responds to stimulation  Airway & Oxygen Therapy:Patient Spontanous Breathing and Patient connected to face mask oxgen  Post-op Assessment:  Report given to PACU RN and Post -op Vital signs reviewed and stable  Post vital signs:  Reviewed and stable  Last Vitals:  Filed Vitals:   06/06/16 0854 06/06/16 1559  BP: 148/94 144/79  Pulse: 90   Temp: 36.7 C 36.9 C  Resp: 20     Complications: No apparent anesthesia complications

## 2016-06-06 NOTE — Progress Notes (Signed)
Patient ID: Sean [name], male   DOB: 07-22-1946, 70 y.o.   MRN: [id number]  Post-op note  Subjective: The patient is doing well.  No complaints.  Objective: Vital signs in last 24 hours: Temp:  [98.1 F (36.7 C)-98.4 F (36.9 C)] 98.4 F (36.9 C) (07/17 1559) Pulse Rate:  [86-95] 88 (07/17 1700) Resp:  [9-21] 19 (07/17 1700) BP: (135-148)/(79-94) 142/80 mmHg (07/17 1700) SpO2:  [97 %-99 %] 98 % (07/17 1700) Weight:  [140.218 kg (309 lb 2 oz)] 140.218 kg (309 lb 2 oz) (07/17 0906)  Intake/Output from previous day:   Intake/Output this shift: Total I/O In: 2800 [I.V.:2600; IV Piggyback:200] Out: 850 [Urine:690; Drains:110; Blood:50]  Physical Exam:  General: Alert and oriented. Abdomen: Soft, Nondistended. Incisions: Clean and dry.  Lab Results:  Recent Labs  06/06/16 1622  HGB 13.3  HCT 39.9    Assessment/Plan: POD#0   1) Continue to monitor   Pryor Curia. MD   LOS: 0 days   Kandee Escalante,LES 06/06/2016, 5:37 PM

## 2016-06-06 NOTE — Discharge Instructions (Signed)

## 2016-06-07 ENCOUNTER — Encounter (HOSPITAL_COMMUNITY): Payer: Self-pay | Admitting: Urology

## 2016-06-07 LAB — BASIC METABOLIC PANEL
Anion gap: 7 (ref 5–15)
BUN: 16 mg/dL (ref 6–20)
CHLORIDE: 105 mmol/L (ref 101–111)
CO2: 26 mmol/L (ref 22–32)
Calcium: 8.2 mg/dL — ABNORMAL LOW (ref 8.9–10.3)
Creatinine, Ser: 1.2 mg/dL (ref 0.61–1.24)
GFR calc Af Amer: >60 mL/min (ref >60)
GFR calc non Af Amer: 60 mL/min — ABNORMAL LOW (ref >60)
Glucose, Bld: 188 mg/dL — ABNORMAL HIGH (ref 65–99)
POTASSIUM: 3.9 mmol/L (ref 3.5–5.1)
SODIUM: 138 mmol/L (ref 135–145)

## 2016-06-07 LAB — HEMOGLOBIN AND HEMATOCRIT, BLOOD
HEMATOCRIT: 39 % (ref 39.0–52.0)
HEMOGLOBIN: 13.4 g/dL (ref 13.0–17.0)

## 2016-06-07 MED ORDER — BISACODYL 10 MG RE SUPP
10.0000 mg | Freq: Once | RECTAL | Status: AC
  Administered 2016-06-07: 10 mg via RECTAL
  Filled 2016-06-07: qty 1

## 2016-06-07 MED ORDER — OXYCODONE HCL 5 MG PO TABS: 10.0000 mg | ORAL_TABLET | ORAL | Status: DC | PRN

## 2016-06-07 MED ORDER — HYDROCODONE-ACETAMINOPHEN 5-325 MG PO TABS
1.0000 | ORAL_TABLET | Freq: Four times a day (QID) | ORAL | Status: DC | PRN
  Filled 2016-06-07: qty 2

## 2016-06-07 MED ORDER — OXYCODONE HCL 5 MG PO TABS: 5.0000 mg | ORAL_TABLET | ORAL | Status: DC | PRN

## 2016-06-07 NOTE — Progress Notes (Signed)
1 Day Post-Op Subjective: The patient is doing well.  No nausea or vomiting. Pain is adequately controlled without pain meds in bed. Foley draining well. Excellent UOP (3.2L) with 66ml from drain overnight. Hb 13.4 from 13.3.  Objective: Vital signs in last 24 hours: Temp:  [98 F (36.7 C)-98.4 F (36.9 C)] 98.2 F (36.8 C) (07/18 0559) Pulse Rate:  [83-98] 98 (07/18 0559) Resp:  [9-21] 18 (07/18 0559) BP: (134-154)/(73-133) 134/73 mmHg (07/18 0559) SpO2:  [93 %-100 %] 100 % (07/18 0559) Weight:  [137.2 kg (302 lb 7.5 oz)-140.218 kg (309 lb 2 oz)] 137.2 kg (302 lb 7.5 oz) (07/17 1822)  Intake/Output from previous day: 07/17 0701 - 07/18 0700 In: 4775 [P.O.:600; I.V.:3625; IV Piggyback:550] Out: [id number] [Urine:3240; Drains:190; Blood:50] Intake/Output this shift: Total I/O In: 1550 [P.O.:600; I.V.:600; IV Piggyback:350] Out: 2530 [Urine:2450; Drains:80]  Physical Exam:  General: Alert and oriented. CV: RRR Lungs: Clear bilaterally. GI: Soft, Nondistended. Incisions: Clean and dry. Urine: Clear orange Extremities: Nontender, no erythema, no edema.  Lab Results:  Recent Labs  06/06/16 1622 06/07/16 0438  HGB 13.3 13.4  HCT 39.9 39.0          Recent Labs  06/01/16 1200 06/06/16 1622 06/07/16 0438  CREATININE 0.91 1.35* 1.20           Results for orders placed or performed during the hospital encounter of 06/06/16 (from the past 24 hour(s))  Basic metabolic panel     Status: Abnormal   Collection Time: 06/06/16  4:22 PM  Result Value Ref Range   Sodium 140 135 - 145 mmol/L   Potassium 4.1 3.5 - 5.1 mmol/L   Chloride 103 101 - 111 mmol/L   CO2 28 22 - 32 mmol/L   Glucose, Bld 165 (H) 65 - 99 mg/dL   BUN 17 6 - 20 mg/dL   Creatinine, Ser 1.35 (H) 0.61 - 1.24 mg/dL   Calcium 8.2 (L) 8.9 - 10.3 mg/dL   GFR calc non Af Amer 52 (L) >60 mL/min   GFR calc Af Amer >60 >60 mL/min   Anion gap 9 5 - 15  Hemoglobin and hematocrit, blood     Status: None   Collection  Time: 06/06/16  4:22 PM  Result Value Ref Range   Hemoglobin 13.3 13.0 - 17.0 g/dL   HCT 39.9 39.0 - [id number] %  Basic metabolic panel     Status: Abnormal   Collection Time: 06/07/16  4:38 AM  Result Value Ref Range   Sodium 138 135 - 145 mmol/L   Potassium 3.9 3.5 - 5.1 mmol/L   Chloride 105 101 - 111 mmol/L   CO2 26 22 - 32 mmol/L   Glucose, Bld 188 (H) 65 - 99 mg/dL   BUN 16 6 - 20 mg/dL   Creatinine, Ser 1.20 0.61 - 1.24 mg/dL   Calcium 8.2 (L) 8.9 - 10.3 mg/dL   GFR calc non Af Amer 60 (L) >60 mL/min   GFR calc Af Amer >60 >60 mL/min   Anion gap 7 5 - 15  Hemoglobin and hematocrit, blood     Status: None   Collection Time: 06/07/16  4:38 AM  Result Value Ref Range   Hemoglobin 13.4 13.0 - 17.0 g/dL   HCT 39.0 39.0 - 52.0 %    Assessment/Plan: POD# 1 s/p robotic partial nephrectomy.  1) Ambulate, Incentive spirometry 2) Advance diet as tolerated 3) Transition to oral pain medication 4) Dulcolax suppository 5) Keep urethral catheter given mild false  passage, plan to d/c it tomorrow    LOS: 1 day   Lolita Rieger 06/07/2016, 6:34 AM

## 2016-06-08 LAB — CREATININE, FLUID (PLEURAL, PERITONEAL, JP DRAINAGE): Creat, Fluid: 1.4 mg/dL

## 2016-06-08 MED ORDER — BISACODYL 10 MG RE SUPP
10.0000 mg | Freq: Once | RECTAL | Status: DC
  Filled 2016-06-08: qty 1

## 2016-06-08 MED ORDER — RAMIPRIL 10 MG PO CAPS
10.0000 mg | ORAL_CAPSULE | Freq: Every day | ORAL | Status: DC
  Administered 2016-06-08: 10 mg via ORAL
  Filled 2016-06-08: qty 1

## 2016-06-08 NOTE — Progress Notes (Signed)
Completed D/C teaching with patient. Answered questions. Gave prescriptions. Pt will be D/C home with family in stable condition. 

## 2016-06-08 NOTE — Progress Notes (Addendum)
Patient ID: Sean [name], male   DOB: 1946-09-16, 70 y.o.   MRN: [id number]  2 Days Post-Op Subjective: Pt was able to ambulate well last night.  Passing some flatus but some nausea this morning.  He has been tolerating regular diet.  Some "gas pains".  Objective: Vital signs in last 24 hours: Temp:  [98.2 F (36.8 C)-99.9 F (37.7 C)] 99.9 F (37.7 C) (07/19 0528) Pulse Rate:  [85-99] 99 (07/19 0528) Resp:  [18] 18 (07/19 0528) BP: (155-166)/(72-97) 166/97 mmHg (07/19 0528) SpO2:  [93 %-94 %] 93 % (07/19 0528)  Intake/Output from previous day: 07/18 0701 - 07/19 0700 In: 1130 [P.O.:480; I.V.:600; IV Piggyback:50] Out: 2130 [Urine:2075; Drains:55] Intake/Output this shift: Total I/O In: -  Out: 1185 [Urine:1175; Drains:10]  Physical Exam:  General: Alert and oriented CV: RRR Lungs: Clear Abdomen: Soft, ND, Some bowel sounds Incisions: C/D/I Ext: NT, No erythema  Lab Results:  Recent Labs  06/06/16 1622 06/07/16 0438  HGB 13.3 13.4  HCT 39.9 39.0   BMET  Recent Labs  06/06/16 1622 06/07/16 0438  NA 140 138  K 4.1 3.9  CL 103 105  CO2 28 26  GLUCOSE 165* 188*  BUN 17 16  CREATININE 1.35* 1.20  CALCIUM 8.2* 8.2*   Drain Cr 1.4  Studies/Results: Path pending.  Assessment/Plan: POD # 2 s/p left RAL partial nephrectomy - D/C drain - D/C catheter with voiding trial - Ambulate, IS - Dulcolax suppository - Re-evaluate later this morning for discharge - Restart ace-inhibitor   LOS: 2 days   Sean [name],LES 06/08/2016, 6:56 AM

## 2016-06-08 NOTE — Progress Notes (Signed)
Pt. Ambulated with RN 7/18 night. Pt was steady and c/o of no SOB. Ambulated with no assistive device and no oxygen. Tolerated well and returned to the bed.

## 2016-06-14 ENCOUNTER — Emergency Department (HOSPITAL_COMMUNITY)
Admission: EM | Admit: 2016-06-14 | Discharge: 2016-06-14 | Disposition: A | Discharge status: Home / Self Care | Payer: Medicare Other | Attending: Emergency Medicine | Admitting: Emergency Medicine

## 2016-06-14 ENCOUNTER — Encounter (HOSPITAL_COMMUNITY): Payer: Self-pay | Admitting: Emergency Medicine

## 2016-06-14 DIAGNOSIS — R58 Hemorrhage, not elsewhere classified: Secondary | ICD-10-CM | POA: Diagnosis not present

## 2016-06-14 DIAGNOSIS — E784 Other hyperlipidemia: Secondary | ICD-10-CM | POA: Diagnosis not present

## 2016-06-14 DIAGNOSIS — Z8546 Personal history of malignant neoplasm of prostate: Secondary | ICD-10-CM | POA: Insufficient documentation

## 2016-06-14 DIAGNOSIS — Z79899 Other long term (current) drug therapy: Secondary | ICD-10-CM | POA: Insufficient documentation

## 2016-06-14 DIAGNOSIS — M109 Gout, unspecified: Secondary | ICD-10-CM | POA: Diagnosis not present

## 2016-06-14 DIAGNOSIS — N9989 Other postprocedural complications and disorders of genitourinary system: Secondary | ICD-10-CM | POA: Diagnosis not present

## 2016-06-14 DIAGNOSIS — M1990 Unspecified osteoarthritis, unspecified site: Secondary | ICD-10-CM | POA: Diagnosis not present

## 2016-06-14 DIAGNOSIS — I10 Essential (primary) hypertension: Secondary | ICD-10-CM | POA: Insufficient documentation

## 2016-06-14 DIAGNOSIS — N99820 Postprocedural hemorrhage and hematoma of a genitourinary system organ or structure following a genitourinary system procedure: Secondary | ICD-10-CM | POA: Insufficient documentation

## 2016-06-14 DIAGNOSIS — I2510 Atherosclerotic heart disease of native coronary artery without angina pectoris: Secondary | ICD-10-CM | POA: Diagnosis not present

## 2016-06-14 DIAGNOSIS — N501 Vascular disorders of male genital organs: Secondary | ICD-10-CM | POA: Diagnosis not present

## 2016-06-14 DIAGNOSIS — F329 Major depressive disorder, single episode, unspecified: Secondary | ICD-10-CM | POA: Insufficient documentation

## 2016-06-14 DIAGNOSIS — E785 Hyperlipidemia, unspecified: Secondary | ICD-10-CM | POA: Diagnosis not present

## 2016-06-14 DIAGNOSIS — Z125 Encounter for screening for malignant neoplasm of prostate: Secondary | ICD-10-CM | POA: Diagnosis not present

## 2016-06-14 DIAGNOSIS — R7309 Other abnormal glucose: Secondary | ICD-10-CM | POA: Diagnosis not present

## 2016-06-14 LAB — CBC WITH DIFFERENTIAL/PLATELET
BASOS PCT: 1 %
Basophils Absolute: 0 10*3/uL (ref 0.0–0.1)
EOS PCT: 3 %
Eosinophils Absolute: 0.2 10*3/uL (ref 0.0–0.7)
HEMATOCRIT: 36.3 % — AB (ref 39.0–52.0)
Hemoglobin: 12.3 g/dL — ABNORMAL LOW (ref 13.0–17.0)
Lymphocytes Relative: 21 %
Lymphs Abs: 1.3 10*3/uL (ref 0.7–4.0)
MCH: 31 pg (ref 26.0–34.0)
MCHC: 33.9 g/dL (ref 30.0–36.0)
MCV: 91.4 fL (ref 78.0–100.0)
MONO ABS: 0.7 10*3/uL (ref 0.1–1.0)
MONOS PCT: 11 %
NEUTROS ABS: 4 10*3/uL (ref 1.7–7.7)
Neutrophils Relative %: 64 %
PLATELETS: 204 10*3/uL (ref 150–400)
RBC: 3.97 MIL/uL — ABNORMAL LOW (ref 4.22–5.81)
RDW: 13.3 % (ref 11.5–15.5)
WBC: 6.2 10*3/uL (ref 4.0–10.5)

## 2016-06-14 LAB — URINALYSIS, ROUTINE W REFLEX MICROSCOPIC
Bilirubin Urine: NEGATIVE
Glucose, UA: NEGATIVE mg/dL
KETONES UR: NEGATIVE mg/dL
LEUKOCYTES UA: NEGATIVE
NITRITE: NEGATIVE
PROTEIN: NEGATIVE mg/dL
Specific Gravity, Urine: 1.019 (ref 1.005–1.030)
pH: 5.5 (ref 5.0–8.0)

## 2016-06-14 LAB — URINE MICROSCOPIC-ADD ON
Bacteria, UA: NONE SEEN
SQUAMOUS EPITHELIAL / LPF: NONE SEEN

## 2016-06-14 NOTE — ED Notes (Signed)
PA at bedside.

## 2016-06-14 NOTE — Discharge Instructions (Signed)
Continue taking your home medications as prescribed. I recommend calling your urologist's office tomorrow morning to schedule a follow-up appointment. Return to the emergency department if symptoms worsen or new onset of fever, abdominal pain, vomiting, lightheadedness, dizziness, blood in urine or stool, bleeding.

## 2016-06-14 NOTE — ED Notes (Signed)
Post-Void Bladder Scan: 60 mL

## 2016-06-14 NOTE — ED Provider Notes (Signed)
[facility] DEPT Provider Note   CSN: [id number] Arrival date & time: 06/14/16  1857  First Provider Contact:  First MD Initiated Contact with Patient 06/14/16 0820        History   Chief Complaint No chief complaint on file.   HPI Sean [name] is a 70 y.o. male.  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 Patient reports this evening he felt his brakes become damp and states when he went into the restroom he noticed he was having bright red blood oozing from his penis. Patient denies any known trauma. Denies use of anticoagulants. Denies any associated pain or swelling to his penis or testicles. Denies fever, chills, cough, shortness of breath, chest pain, abdominal pain, nausea, vomiting, diarrhea, dysuria, urinary retention. Patient reports he had a tumor removed from his left kidney last week by Dr. Alinda Money and notes that he did not have any cough location's after the procedure until today. Patient endorses noticing a small clot in the blood. He notes the bleeding has slowed down since arrival to the ED.      Past Medical History:  Diagnosis Date  . Arthritis   . Cancer [facility])    prostate cancer   . Carpal tunnel syndrome    bilat   . Coronary artery disease CARDIOLOGIST- DR CRENSHAW  . Depression    history of at age [age] years old; denies current issues   . H/O simple renal cyst   . Hematuria   . History of concussion AS  CHILD-- NO RESIDUAL  . History of kidney stones   . History of prostate cancer    S/P RADIOACTIVE SEED IMPLANTS AND EXTERNAL RADIATION---  NO RECURRENCE  . History of radiation therapy    25 treatments   . History of urinary tract infection   . History of vertigo   . Hypertension   . Nocturia   . PONV (postoperative nausea and vomiting)   . Shortness of  breath dyspnea    with increased exertion   . Urethral stricture   . Wears glasses     Patient Active Problem List   Diagnosis Date Noted  . 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  . HYPERLIPIDEMIA 11/26/2009  . Essential hypertension 11/26/2009  . Coronary atherosclerosis 11/26/2009    Past Surgical History:  Procedure Laterality Date  . CARDIAC CATHETERIZATION  12-18-2003   SEVERE TWO VESSEL CAD/  FIRST DIAGONAL  95% STENOSIS/ CIRCUMFLEX 95% STENOSIS IN THE OSTIUM OF LARGE FIRST OM THAT WAS OCCLUDED AFTER THE FIRST MARGINAL/  COLLATERAL FLOW FROM THE LAD TO THE OBTUSE MARGINALS/  EF 50%---- MEDICAL MANAGEMENT  . CARDIOVASCULAR STRESS TEST  05-16-2011   DR CRENSHAW   PROBABLE NORMAL PERFUSION AND SOFT TISSUE ATTENUATION/ NO SIGNIFICANT ISCHEMIA OR SCAR /  EF  59%  . COLONSCOPY WITH POLYPS REMOVED    . CYSTOSCOPY N/A 06/06/2016   Procedure: CYSTOSCOPY FLEXIBLE;  Surgeon: Raynelle Bring, MD;  Location: [facility];  Service: Urology;  Laterality: N/A;  . CYSTOSCOPY WITH URETHRAL DILATATION  04/25/2012   Procedure: CYSTOSCOPY WITH URETHRAL DILATATION;  Surgeon: Franchot Gallo, MD;  Location: [facility];  Service: Urology;  Laterality: N/A;   CYSTOSCOPY, BALLOON DILATION OF URETHRAL STRICTURE  . CYSTOSCOPY WITH URETHRAL DILATATION N/A 04/12/2013   Procedure: CYSTOSCOPY WITH URETHRAL DILATATION;  Surgeon: Franchot Gallo,  MD;  Location: [facility];  Service: Urology;  Laterality: N/A;  45 MIN   . RADIOACTIVE SEED IMPLANTS, PROSTATE  11-26-2010  . RIGHT URETEROSCOPIC STONE EXTRACTION  09-04-2003  . ROBOTIC ASSITED PARTIAL NEPHRECTOMY Left 06/06/2016   Procedure: XI ROBOTIC ASSITED PARTIAL NEPHRECTOMY;  Surgeon: Raynelle Bring, MD;  Location: [facility];  Service: Urology;  Laterality: Left;  . TRANSTHORACIC ECHOCARDIOGRAM  09-22-2003   LVSF NORMAL/  MILD TO MODERATE PULMONARY REGURG.       Home Medications    Prior to Admission medications   Medication Sig Start  Date End Date Taking? Authorizing Provider  allopurinol (ZYLOPRIM) 100 MG tablet Take 200 mg by mouth every evening.    Yes Historical Provider, MD  atorvastatin (LIPITOR) 80 MG tablet Take 80 mg by mouth every evening.    Yes Historical Provider, MD  metoprolol succinate (TOPROL-XL) 50 MG 24 hr tablet Take 75 mg by mouth every morning. Take with or immediately following a meal.   Yes Historical Provider, MD  ramipril (ALTACE) 10 MG capsule Take 10 mg by mouth daily.    Yes Historical Provider, MD  HYDROcodone-acetaminophen (NORCO) 5-325 MG tablet Take 1-2 tablets by mouth every 6 (six) hours as needed for moderate pain. Patient not taking: Reported on 06/14/2016 06/06/16   Debbrah Alar, PA-C    Family History Family History  Problem Relation Age of Onset  . Diabetes Father   . Colon cancer Neg Hx   . Esophageal cancer Neg Hx   . Rectal cancer Neg Hx   . Stomach cancer Neg Hx     Social History Social History  Substance Use Topics  . Smoking status: Never Smoker  . Smokeless tobacco: Never Used  . Alcohol use No     Allergies   Morphine and related; Codeine; and Lariam [mefloquine hcl]   Review of Systems Review of Systems  Genitourinary:       Penile bleeding  All other systems reviewed and are negative.    Physical Exam Updated Vital Signs BP 166/92   Pulse 87   Temp 98.3 F (36.8 C) (Oral)   Resp 18   Ht 5\' 10"  (1.778 m)   Wt 136.1 kg   SpO2 95%   BMI 43.05 kg/m   Physical Exam  Constitutional: He is oriented to person, place, and time. He appears well-developed and well-nourished. No distress.  Morbidly obese male  HENT:  Head: Normocephalic and atraumatic.  Mouth/Throat: Oropharynx is clear and moist. No oropharyngeal exudate.  Eyes: Conjunctivae and EOM are normal. Right eye exhibits no discharge. Left eye exhibits no discharge. No scleral icterus.  Neck: Normal range of motion. Neck supple.  Cardiovascular: Normal rate, regular rhythm, normal heart  sounds and intact distal pulses.   Pulmonary/Chest: Effort normal and breath sounds normal. No respiratory distress. He has no wheezes. He has no rales. He exhibits no tenderness.  Abdominal: Soft. Bowel sounds are normal. He exhibits no distension and no mass. There is no tenderness. There is no rebound and no guarding. No hernia. Hernia confirmed negative in the right inguinal area and confirmed negative in the left inguinal area.  3 well healing surgical incision sited noted to abdomen without any surrounding swelling, erythema, warmth or drainage.  Genitourinary: Testes normal. Cremasteric reflex is present. Right testis shows no mass, no swelling and no tenderness. Left testis shows no mass, no swelling and no tenderness. Circumcised. No phimosis, paraphimosis, hypospadias, penile erythema or penile tenderness. Discharge (blood) found.  Genitourinary Comments: Small amount of bright red blood oozing from urethral opening  Musculoskeletal: He exhibits no edema.  Lymphadenopathy: No inguinal adenopathy noted on the right or left side.  Neurological: He is alert and oriented to person, place, and time.  Skin: Skin is warm and dry. He is not diaphoretic.  Nursing note and vitals reviewed.    ED Treatments / Results  Labs (all labs ordered are listed, but only abnormal results are displayed) Labs Reviewed  URINALYSIS, ROUTINE W REFLEX MICROSCOPIC (NOT AT [facility]) - Abnormal; Notable for the following:       Result Value   APPearance CLOUDY (*)    Hgb urine dipstick LARGE (*)    All other components within normal limits  CBC WITH DIFFERENTIAL/PLATELET - Abnormal; Notable for the following:    RBC 3.97 (*)    Hemoglobin 12.3 (*)    HCT 36.3 (*)    All other components within normal limits  URINE MICROSCOPIC-ADD ON    EKG  EKG Interpretation None       Radiology No results found.  Procedures Procedures (including critical care time)  Medications Ordered in ED Medications - No  data to display   Initial Impression / Assessment and Plan / ED Course  I have reviewed the triage vital signs and the nursing notes.  Pertinent labs & imaging results that were available during my care of the patient were reviewed by me and considered in my medical decision making (see chart for details).  Clinical Course    Patient presents with penile bleeding that started prior to arrival. Patient reports having a left partial nephrectomy performed last week by Dr. Alinda Money to remove a tumor. Patient denies any surgical complications. Denies use of anticoagulants. VSS. Exam revealed small amount of blood present in urethral opening, no active bleeding. Remaining abdominal and GU exam unremarkable. UA positive for large hgb. Post-void bladder scan 49mL.  Consulted Urology. Dr. Junious Silk reports bleeding is common with partial nephrectomies and advised to check pt's H&H, assuming pt remains hemodynamically stable in the ED and H&H are stable, have pt follow up outpatient at his scheduled appointment.   Hgb 12.3, Hct 36.3. Patient maintained hemodynamically stable in the ED. Discussed results and plan for discharge with patient. Advised patient to follow up with his urologist at his scheduled appointment next week. Discussed return precautions with patient.  Final Clinical Impressions(s) / ED Diagnoses   Final diagnoses:  Postoperative hemorrhage involving genitourinary system following genitourinary procedure    New Prescriptions New Prescriptions   No medications on file     Nona Dell, PA-C 06/14/16 2306    Harvel Quale, MD 06/15/16 1517

## 2016-06-14 NOTE — ED Triage Notes (Signed)
Per EMS, patient had surgery last Monday (tumor removed from left kidney); today patient is having bright red blood coming from penis. Denies pain, nausea, vomiting, diarrhea Patient is from home.

## 2016-06-14 NOTE — ED Notes (Signed)
Bed: [id number] Expected date:  Expected time:  Means of arrival:  Comments: EMS- 70yo M, penile bleeding/recent surgery

## 2016-06-21 DIAGNOSIS — Z0000 Encounter for general adult medical examination without abnormal findings: Secondary | ICD-10-CM | POA: Diagnosis not present

## 2016-06-21 DIAGNOSIS — I428 Other cardiomyopathies: Secondary | ICD-10-CM | POA: Diagnosis not present

## 2016-06-21 DIAGNOSIS — I2510 Atherosclerotic heart disease of native coronary artery without angina pectoris: Secondary | ICD-10-CM | POA: Diagnosis not present

## 2016-06-21 DIAGNOSIS — C649 Malignant neoplasm of unspecified kidney, except renal pelvis: Secondary | ICD-10-CM | POA: Diagnosis not present

## 2016-06-21 DIAGNOSIS — E784 Other hyperlipidemia: Secondary | ICD-10-CM | POA: Diagnosis not present

## 2016-06-21 DIAGNOSIS — R918 Other nonspecific abnormal finding of lung field: Secondary | ICD-10-CM | POA: Diagnosis not present

## 2016-06-21 DIAGNOSIS — C642 Malignant neoplasm of left kidney, except renal pelvis: Secondary | ICD-10-CM | POA: Diagnosis not present

## 2016-06-21 DIAGNOSIS — I130 Hypertensive heart and chronic kidney disease with heart failure and stage 1 through stage 4 chronic kidney disease, or unspecified chronic kidney disease: Secondary | ICD-10-CM | POA: Diagnosis not present

## 2016-06-21 DIAGNOSIS — R7309 Other abnormal glucose: Secondary | ICD-10-CM | POA: Diagnosis not present

## 2016-06-21 DIAGNOSIS — M1990 Unspecified osteoarthritis, unspecified site: Secondary | ICD-10-CM | POA: Diagnosis not present

## 2016-06-21 DIAGNOSIS — N182 Chronic kidney disease, stage 2 (mild): Secondary | ICD-10-CM | POA: Diagnosis not present

## 2016-06-21 DIAGNOSIS — R808 Other proteinuria: Secondary | ICD-10-CM | POA: Diagnosis not present

## 2016-06-21 DIAGNOSIS — Z6841 Body Mass Index (BMI) 40.0 and over, adult: Secondary | ICD-10-CM | POA: Diagnosis not present

## 2016-08-06 DIAGNOSIS — Z23 Encounter for immunization: Secondary | ICD-10-CM | POA: Diagnosis not present

## 2016-08-08 ENCOUNTER — Encounter: Payer: Self-pay | Admitting: Skilled Nursing Facility1

## 2016-08-08 ENCOUNTER — Encounter: Payer: Medicare Other | Attending: Internal Medicine | Admitting: Skilled Nursing Facility1

## 2016-08-08 DIAGNOSIS — Z713 Dietary counseling and surveillance: Secondary | ICD-10-CM | POA: Diagnosis not present

## 2016-08-08 DIAGNOSIS — I10 Essential (primary) hypertension: Secondary | ICD-10-CM | POA: Insufficient documentation

## 2016-08-08 DIAGNOSIS — N189 Chronic kidney disease, unspecified: Secondary | ICD-10-CM | POA: Insufficient documentation

## 2016-08-08 DIAGNOSIS — E782 Mixed hyperlipidemia: Secondary | ICD-10-CM | POA: Insufficient documentation

## 2016-08-08 DIAGNOSIS — I2510 Atherosclerotic heart disease of native coronary artery without angina pectoris: Secondary | ICD-10-CM

## 2016-08-08 NOTE — Progress Notes (Signed)
  Medical Nutrition Therapy:  Appt start time: 10:45 end time: 12:00  Assessment:  Primary concerns today: chronic renal disease. Pt states he would like to eat healthier. Pt states he would like to lose wt. Pt states he sleeps well but has to use the restroom once in the middle of the night. Pt has had a partial nephrectomy and has had his prostate removed due to cancer. Pts A1C 6.3. Pt states he is excited because he can drive again. Pt states he enjoys playing bridge. Pt states he just bought a new honda hybrid which he is very excited about. Pt states his energy level has decreased recently. Pt does have CAD and fatty liver. Pt states he has not had a kidney stone since 2004 which he is very excited about. Pt states she lives by himself. Pt states he loves to play piano. Pt does not report any abdominal pain.  Pt had a lot to say and seemed to just want to be heard so dietitian offered what education the pt would listen to but mostly filled in his need of someone hearing him.  Preferred Learning Style:   No preference indicated   Learning Readiness:   Contemplating  MEDICATIONS: See List   DIETARY INTAKE:  Usual eating pattern includes 3 meals and 2 snacks per day.  Everyday foods include none stated.  Avoided foods include none stated.    24-hr recall:  B ( AM): boiled egg 2 with toast----frozen waffles---oatmeal from [employer] Snk ( AM):   L ( PM): sandwich---out somewhere-meat and vegetables Snk ( PM): nuts D ( PM): fish---out somwhere Snk ( PM):  Beverages: diet gingerale, water, juice, sweet tea,  *meals outside the home: 50% and more Usual physical activity: ADL's  Progress Towards Goal(s):  In progress.    Intervention:  Nutrition counseling. Dietitian listened to the pt.  Goals: -Continue to eat three meals a day with snacks in between (if you are hungry) -A snack: Fruit OR Vegetable AND Protein -A meal: Carbohydrate, protein, vegetable -Honor your body by listening  to your hunger and fullness cues -First thought: Am I hungry? -Second thought: (if the answer is yes I am hungry) Does this meal have vegetables? Protein? Carbohydrate?  -Third thought: Are there more vegetables on my plate compared to Protein and Carbohydrates? -After you have finished your first serving Do Not go back for more until you have waited 20-30 minutes and checked in with your body by asking Am I Still Hungry?  -Try 1% milk -Try making your squash casserole with soy milk  Demonstrated degree of understanding via:  Teach Back   Monitoring/Evaluation:  Dietary intake, exercise, liver related/heart related/kidney related labs, and body weight prn.

## 2016-08-08 NOTE — Patient Instructions (Addendum)
-  Continue to eat three meals a day with snacks in between (if you are hungry) -A snack: Fruit OR Vegetable AND Protein -A meal: Carbohydrate, protein, vegetable -Honor your body by listening to your hunger and fullness cues -First thought: Am I hungry? -Second thought: (if the answer is yes I am hungry) Does this meal have vegetables? Protein? Carbohydrate?  -Third thought: Are there more vegetables on my plate compared to Protein and Carbohydrates? -After you have finished your first serving Do Not go back for more until you have waited 20-30 minutes and checked in with your body by asking Am I Still Hungry?  -Try 1% milk -Try making your squash casserole with soy milk

## 2016-08-25 ENCOUNTER — Encounter: Payer: Self-pay | Admitting: Internal Medicine

## 2016-10-25 DIAGNOSIS — I428 Other cardiomyopathies: Secondary | ICD-10-CM | POA: Diagnosis not present

## 2016-10-25 DIAGNOSIS — N182 Chronic kidney disease, stage 2 (mild): Secondary | ICD-10-CM | POA: Diagnosis not present

## 2016-10-25 DIAGNOSIS — C61 Malignant neoplasm of prostate: Secondary | ICD-10-CM | POA: Diagnosis not present

## 2016-10-25 DIAGNOSIS — R0602 Shortness of breath: Secondary | ICD-10-CM | POA: Diagnosis not present

## 2016-10-25 DIAGNOSIS — M1990 Unspecified osteoarthritis, unspecified site: Secondary | ICD-10-CM | POA: Diagnosis not present

## 2016-10-25 DIAGNOSIS — I130 Hypertensive heart and chronic kidney disease with heart failure and stage 1 through stage 4 chronic kidney disease, or unspecified chronic kidney disease: Secondary | ICD-10-CM | POA: Diagnosis not present

## 2016-10-25 DIAGNOSIS — C649 Malignant neoplasm of unspecified kidney, except renal pelvis: Secondary | ICD-10-CM | POA: Diagnosis not present

## 2016-10-25 DIAGNOSIS — I10 Essential (primary) hypertension: Secondary | ICD-10-CM | POA: Diagnosis not present

## 2016-10-25 DIAGNOSIS — Z6841 Body Mass Index (BMI) 40.0 and over, adult: Secondary | ICD-10-CM | POA: Diagnosis not present

## 2016-11-23 DIAGNOSIS — R0602 Shortness of breath: Secondary | ICD-10-CM | POA: Diagnosis not present

## 2016-11-23 DIAGNOSIS — I130 Hypertensive heart and chronic kidney disease with heart failure and stage 1 through stage 4 chronic kidney disease, or unspecified chronic kidney disease: Secondary | ICD-10-CM | POA: Diagnosis not present

## 2016-11-23 DIAGNOSIS — I2510 Atherosclerotic heart disease of native coronary artery without angina pectoris: Secondary | ICD-10-CM | POA: Diagnosis not present

## 2016-11-23 DIAGNOSIS — Z6841 Body Mass Index (BMI) 40.0 and over, adult: Secondary | ICD-10-CM | POA: Diagnosis not present

## 2016-12-16 DIAGNOSIS — C61 Malignant neoplasm of prostate: Secondary | ICD-10-CM | POA: Diagnosis not present

## 2016-12-23 ENCOUNTER — Ambulatory Visit (HOSPITAL_COMMUNITY)
Admission: RE | Admit: 2016-12-23 | Discharge: 2016-12-23 | Disposition: A | Discharge status: Home / Self Care | Payer: Medicare Other | Source: Ambulatory Visit | Attending: Urology | Admitting: Urology

## 2016-12-23 ENCOUNTER — Other Ambulatory Visit: Payer: Self-pay | Admitting: Urology

## 2016-12-23 DIAGNOSIS — C61 Malignant neoplasm of prostate: Secondary | ICD-10-CM | POA: Diagnosis not present

## 2016-12-23 DIAGNOSIS — C642 Malignant neoplasm of left kidney, except renal pelvis: Secondary | ICD-10-CM

## 2016-12-23 DIAGNOSIS — N35010 Post-traumatic urethral stricture, male, meatal: Secondary | ICD-10-CM | POA: Diagnosis not present

## 2016-12-23 DIAGNOSIS — R918 Other nonspecific abnormal finding of lung field: Secondary | ICD-10-CM | POA: Diagnosis not present

## 2016-12-23 DIAGNOSIS — I517 Cardiomegaly: Secondary | ICD-10-CM | POA: Insufficient documentation

## 2016-12-23 IMAGING — CR DG CHEST 2V
2 series · 2 of 2 positions shown · non-contrast
Comparison: [DATE], [DATE]

CLINICAL DATA: Hx of partial left kidney removed in [REDACTED] for
cancer. Medicated hypertension and nonsmoker.

EXAM:
CHEST  2 VIEW

[w chest pa]
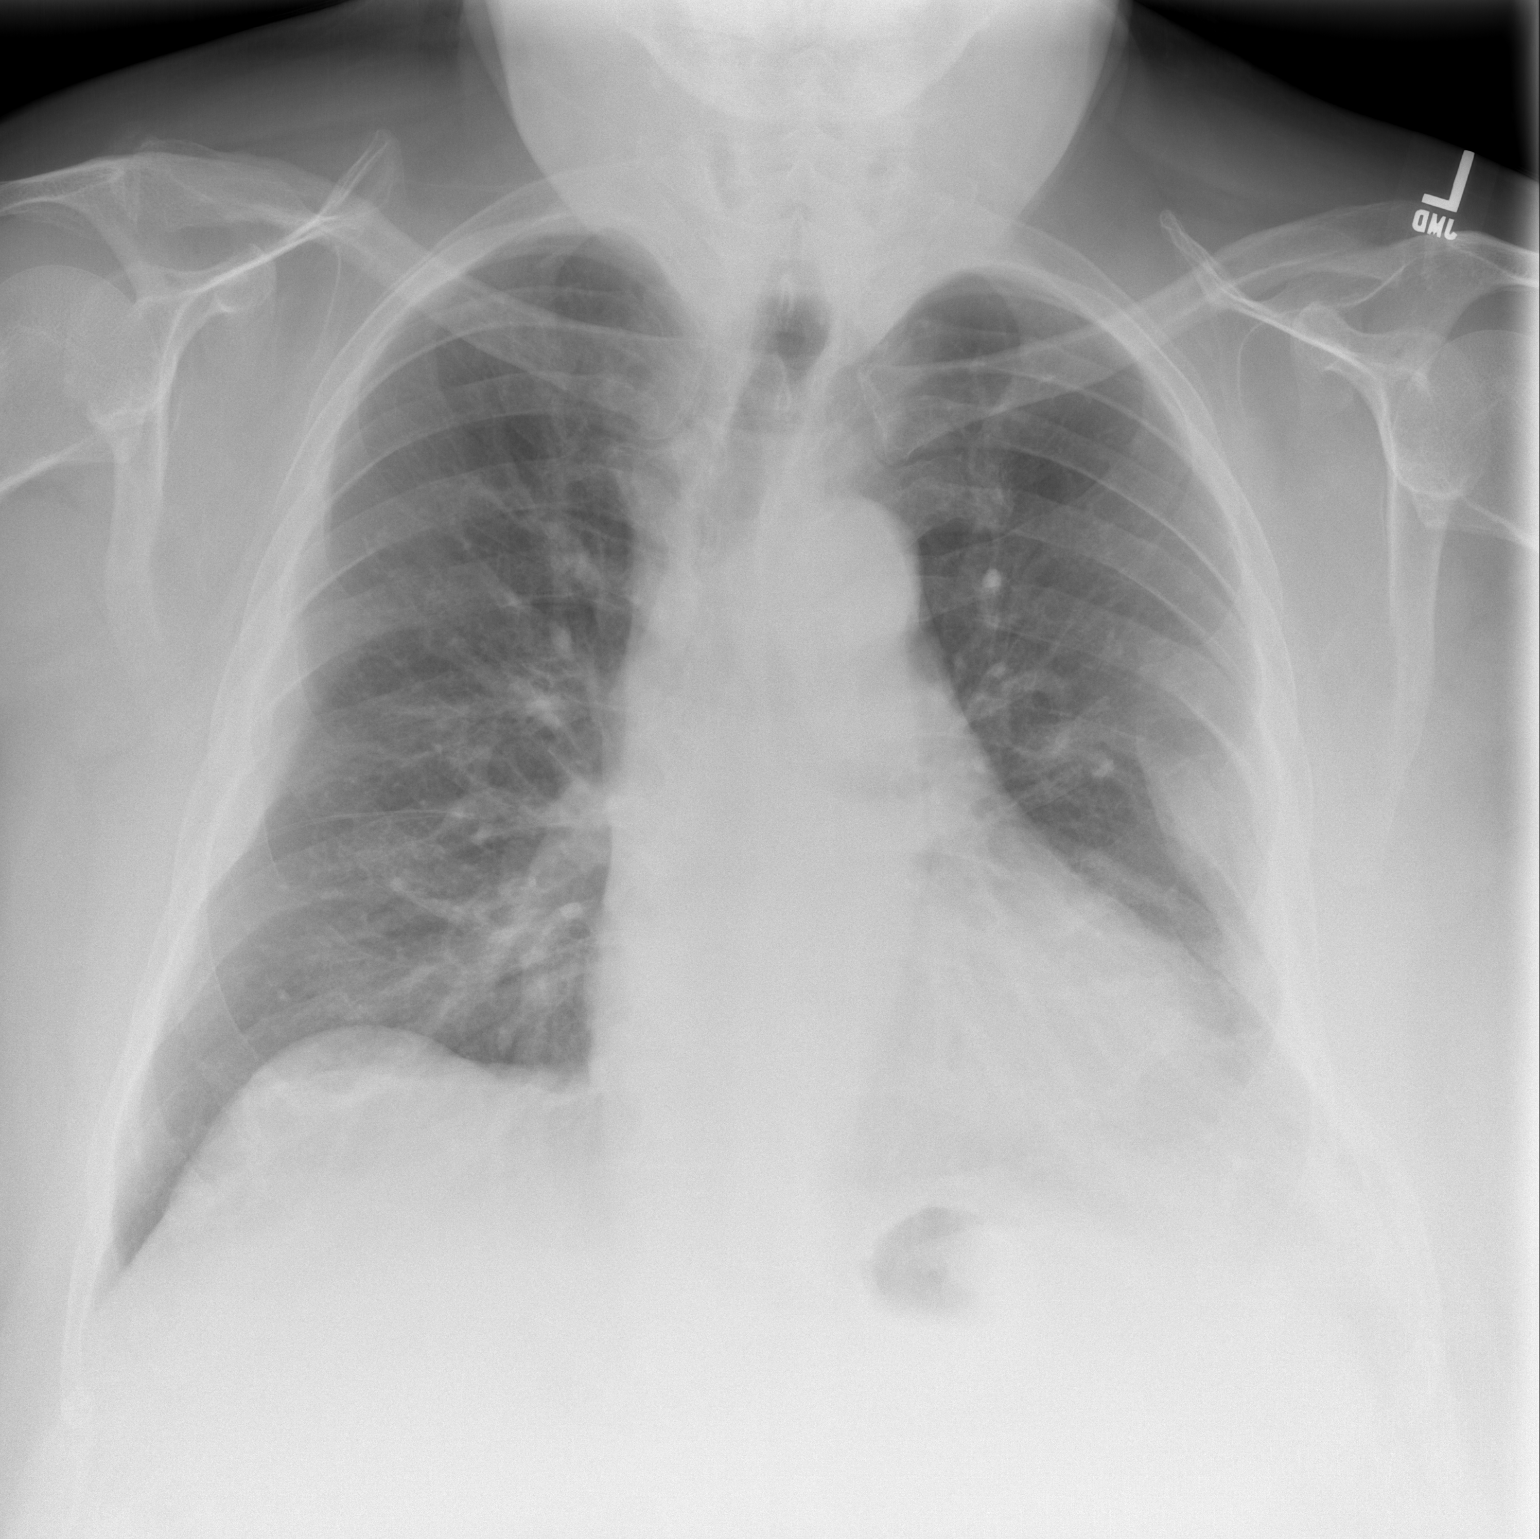

[w chest lat]
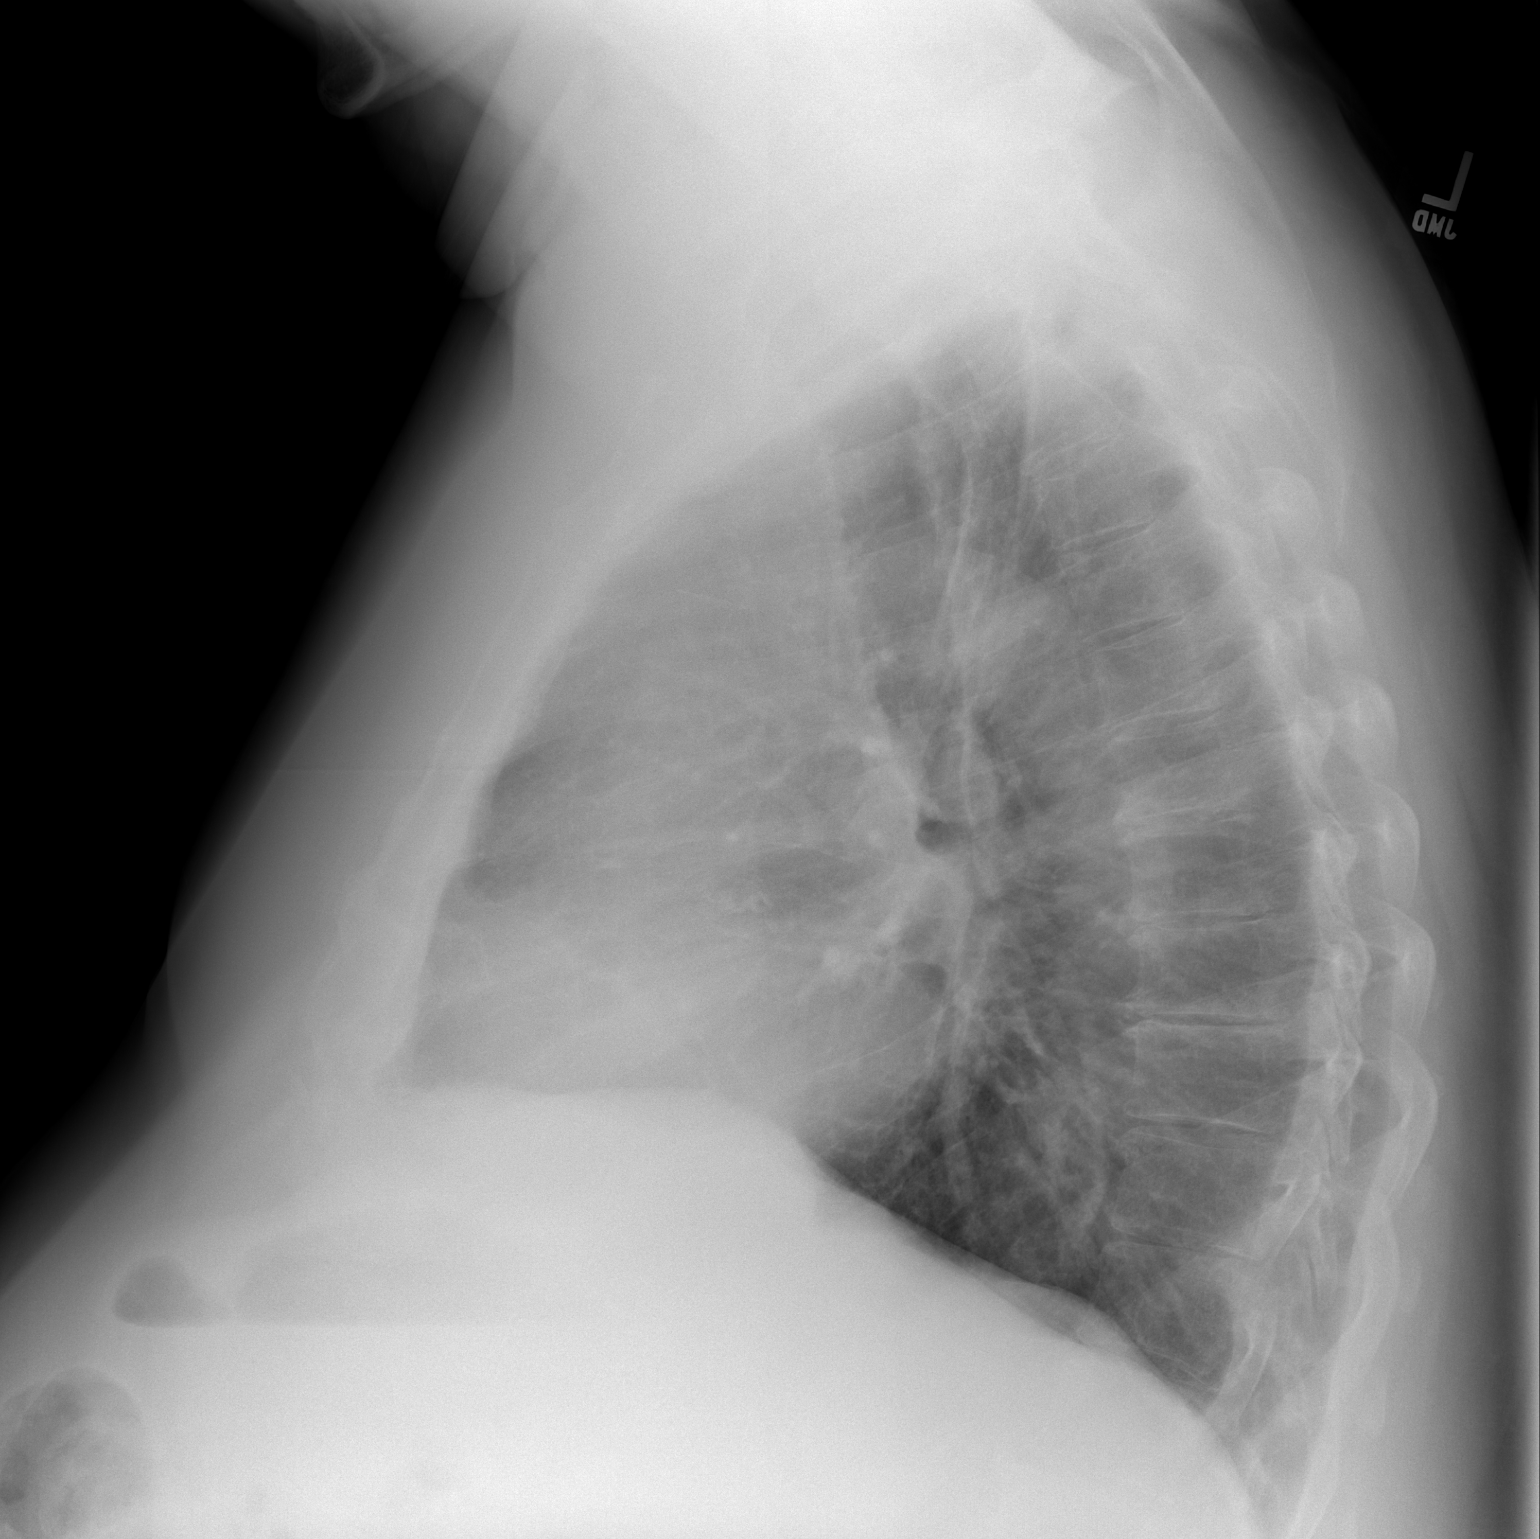

[2 of 2 positions shown; findings below may reference images not displayed]

FINDINGS: Heart is mildly enlarged and stable in configuration. Stable pleural
thickening noted on the left side. There are no new consolidations.
No definite pleural effusions. Remote bilateral rib fractures and
thoracotomy changes on left. No pulmonary edema. Mild degenerative
changes in the thoracic spine.
IMPRESSION: Stable cardiomegaly and pleural changes on the left. No evidence for
acute abnormality.

## 2016-12-28 DIAGNOSIS — N200 Calculus of kidney: Secondary | ICD-10-CM | POA: Diagnosis not present

## 2016-12-28 DIAGNOSIS — C642 Malignant neoplasm of left kidney, except renal pelvis: Secondary | ICD-10-CM | POA: Diagnosis not present

## 2017-02-14 DIAGNOSIS — I130 Hypertensive heart and chronic kidney disease with heart failure and stage 1 through stage 4 chronic kidney disease, or unspecified chronic kidney disease: Secondary | ICD-10-CM | POA: Diagnosis not present

## 2017-02-14 DIAGNOSIS — M1990 Unspecified osteoarthritis, unspecified site: Secondary | ICD-10-CM | POA: Diagnosis not present

## 2017-02-14 DIAGNOSIS — R7309 Other abnormal glucose: Secondary | ICD-10-CM | POA: Diagnosis not present

## 2017-02-14 DIAGNOSIS — R0602 Shortness of breath: Secondary | ICD-10-CM | POA: Diagnosis not present

## 2017-02-14 DIAGNOSIS — Z6841 Body Mass Index (BMI) 40.0 and over, adult: Secondary | ICD-10-CM | POA: Diagnosis not present

## 2017-02-14 DIAGNOSIS — C649 Malignant neoplasm of unspecified kidney, except renal pelvis: Secondary | ICD-10-CM | POA: Diagnosis not present

## 2017-06-14 DIAGNOSIS — H43813 Vitreous degeneration, bilateral: Secondary | ICD-10-CM | POA: Diagnosis not present

## 2017-06-14 DIAGNOSIS — H52203 Unspecified astigmatism, bilateral: Secondary | ICD-10-CM | POA: Diagnosis not present

## 2017-06-14 DIAGNOSIS — H25813 Combined forms of age-related cataract, bilateral: Secondary | ICD-10-CM | POA: Diagnosis not present

## 2017-06-14 DIAGNOSIS — D2312 Other benign neoplasm of skin of left eyelid, including canthus: Secondary | ICD-10-CM | POA: Diagnosis not present

## 2017-06-20 DIAGNOSIS — R7309 Other abnormal glucose: Secondary | ICD-10-CM | POA: Diagnosis not present

## 2017-06-20 DIAGNOSIS — Z125 Encounter for screening for malignant neoplasm of prostate: Secondary | ICD-10-CM | POA: Diagnosis not present

## 2017-06-20 DIAGNOSIS — E784 Other hyperlipidemia: Secondary | ICD-10-CM | POA: Diagnosis not present

## 2017-06-20 DIAGNOSIS — I10 Essential (primary) hypertension: Secondary | ICD-10-CM | POA: Diagnosis not present

## 2017-06-20 DIAGNOSIS — M109 Gout, unspecified: Secondary | ICD-10-CM | POA: Diagnosis not present

## 2017-06-21 DIAGNOSIS — C61 Malignant neoplasm of prostate: Secondary | ICD-10-CM | POA: Diagnosis not present

## 2017-06-27 DIAGNOSIS — M1990 Unspecified osteoarthritis, unspecified site: Secondary | ICD-10-CM | POA: Diagnosis not present

## 2017-06-27 DIAGNOSIS — Z1389 Encounter for screening for other disorder: Secondary | ICD-10-CM | POA: Diagnosis not present

## 2017-06-27 DIAGNOSIS — R0602 Shortness of breath: Secondary | ICD-10-CM | POA: Diagnosis not present

## 2017-06-27 DIAGNOSIS — C649 Malignant neoplasm of unspecified kidney, except renal pelvis: Secondary | ICD-10-CM | POA: Diagnosis not present

## 2017-06-27 DIAGNOSIS — N182 Chronic kidney disease, stage 2 (mild): Secondary | ICD-10-CM | POA: Diagnosis not present

## 2017-06-27 DIAGNOSIS — E784 Other hyperlipidemia: Secondary | ICD-10-CM | POA: Diagnosis not present

## 2017-06-27 DIAGNOSIS — I130 Hypertensive heart and chronic kidney disease with heart failure and stage 1 through stage 4 chronic kidney disease, or unspecified chronic kidney disease: Secondary | ICD-10-CM | POA: Diagnosis not present

## 2017-06-27 DIAGNOSIS — E1122 Type 2 diabetes mellitus with diabetic chronic kidney disease: Secondary | ICD-10-CM | POA: Diagnosis not present

## 2017-06-27 DIAGNOSIS — Z0000 Encounter for general adult medical examination without abnormal findings: Secondary | ICD-10-CM | POA: Diagnosis not present

## 2017-06-27 DIAGNOSIS — R918 Other nonspecific abnormal finding of lung field: Secondary | ICD-10-CM | POA: Diagnosis not present

## 2017-06-27 DIAGNOSIS — Z6841 Body Mass Index (BMI) 40.0 and over, adult: Secondary | ICD-10-CM | POA: Diagnosis not present

## 2017-06-28 DIAGNOSIS — N35010 Post-traumatic urethral stricture, male, meatal: Secondary | ICD-10-CM | POA: Diagnosis not present

## 2017-06-28 DIAGNOSIS — C642 Malignant neoplasm of left kidney, except renal pelvis: Secondary | ICD-10-CM | POA: Diagnosis not present

## 2017-06-28 DIAGNOSIS — C61 Malignant neoplasm of prostate: Secondary | ICD-10-CM | POA: Diagnosis not present

## 2017-07-03 DIAGNOSIS — Z1212 Encounter for screening for malignant neoplasm of rectum: Secondary | ICD-10-CM | POA: Diagnosis not present

## 2017-07-04 ENCOUNTER — Other Ambulatory Visit (HOSPITAL_COMMUNITY): Payer: Self-pay | Admitting: Urology

## 2017-07-04 ENCOUNTER — Ambulatory Visit (HOSPITAL_COMMUNITY)
Admission: RE | Admit: 2017-07-04 | Discharge: 2017-07-04 | Disposition: A | Discharge status: Home / Self Care | Payer: Medicare Other | Source: Ambulatory Visit | Attending: Urology | Admitting: Urology

## 2017-07-04 DIAGNOSIS — C649 Malignant neoplasm of unspecified kidney, except renal pelvis: Secondary | ICD-10-CM | POA: Diagnosis not present

## 2017-07-04 DIAGNOSIS — K760 Fatty (change of) liver, not elsewhere classified: Secondary | ICD-10-CM | POA: Diagnosis not present

## 2017-07-04 DIAGNOSIS — C642 Malignant neoplasm of left kidney, except renal pelvis: Secondary | ICD-10-CM

## 2017-07-04 DIAGNOSIS — N281 Cyst of kidney, acquired: Secondary | ICD-10-CM | POA: Diagnosis not present

## 2017-07-04 IMAGING — CR DG CHEST 2V
2 series · 2 of 2 positions shown · non-contrast
Comparison: [DATE] .

CLINICAL DATA: Follow-up renal cell cancer.

EXAM:
CHEST  2 VIEW

[w chest pa]
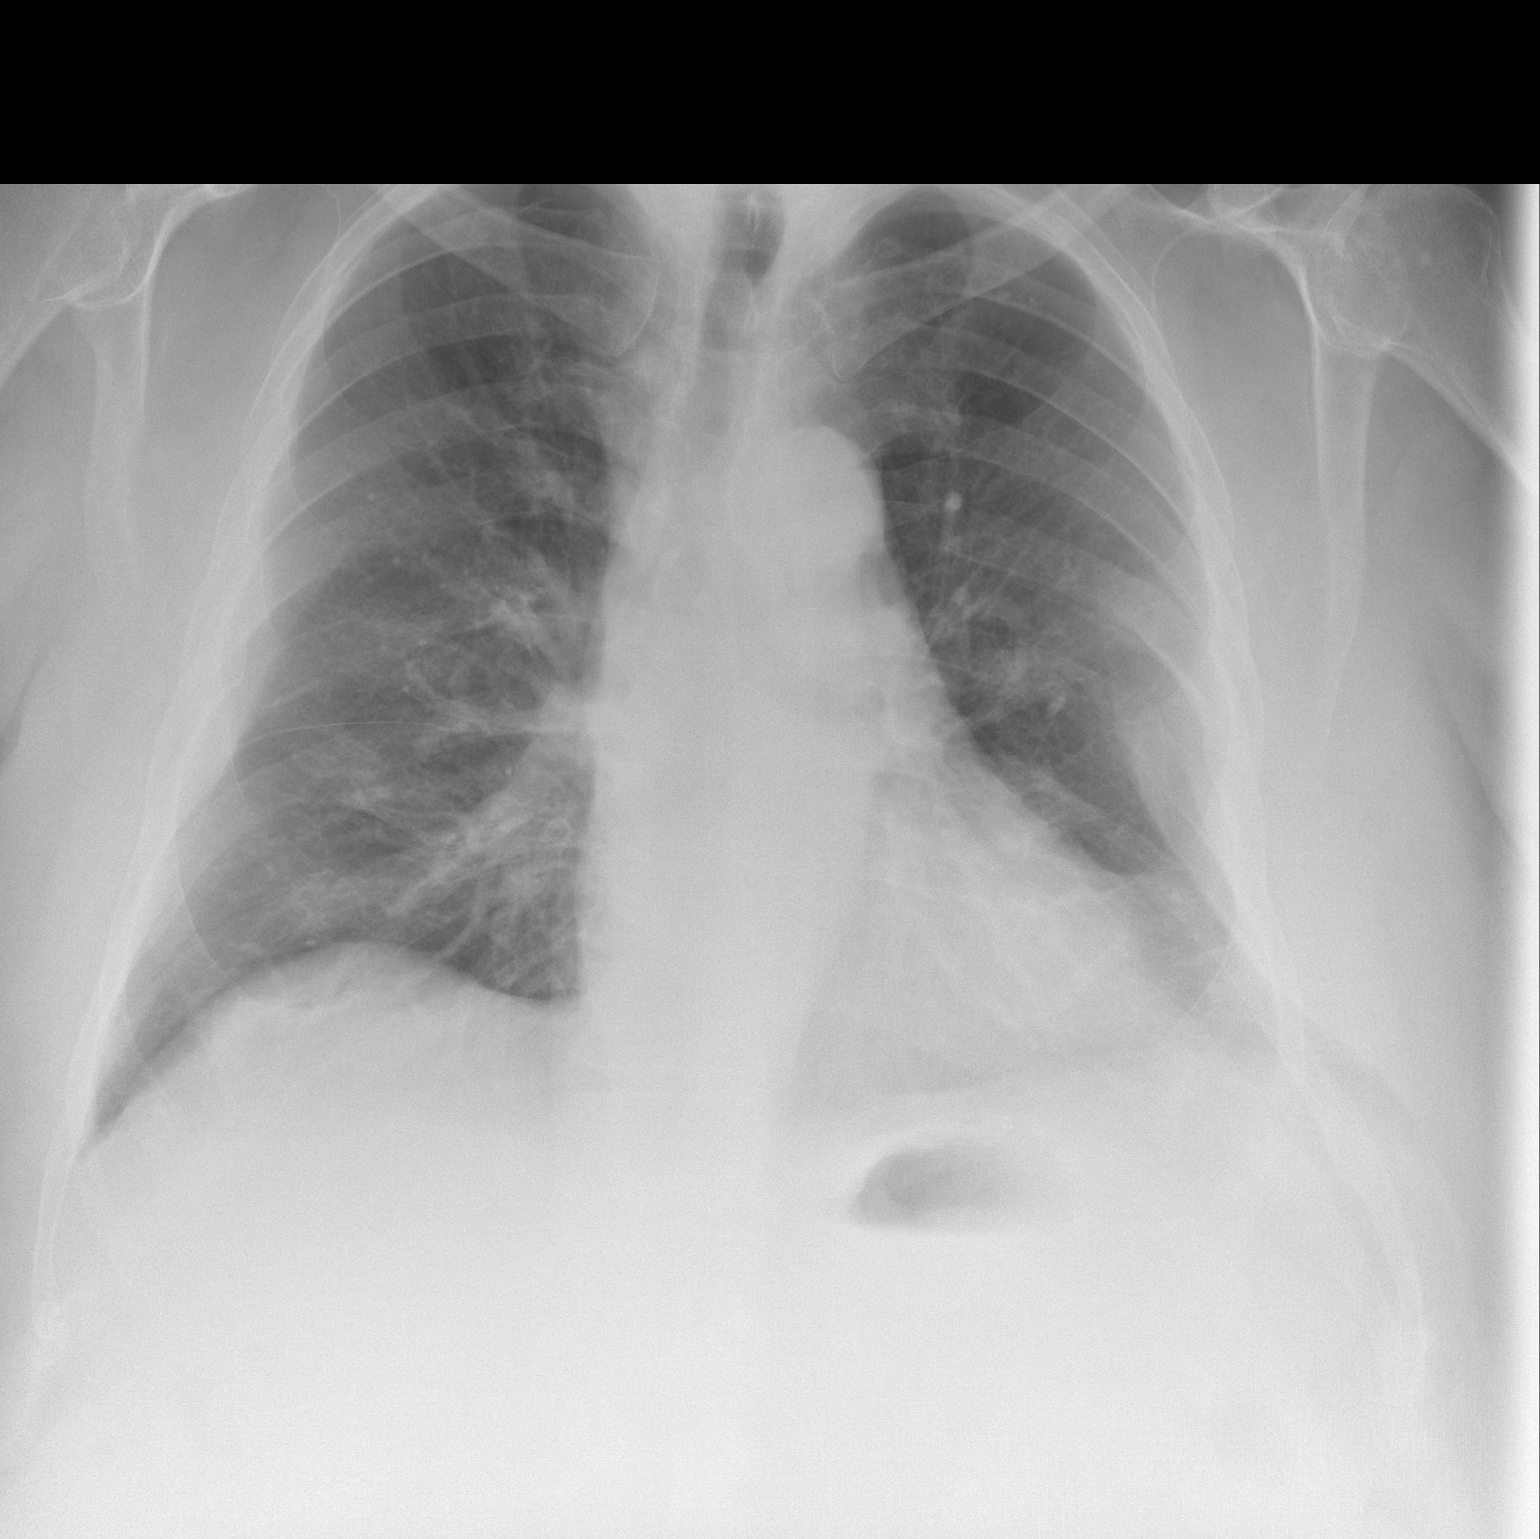

[w chest lat]
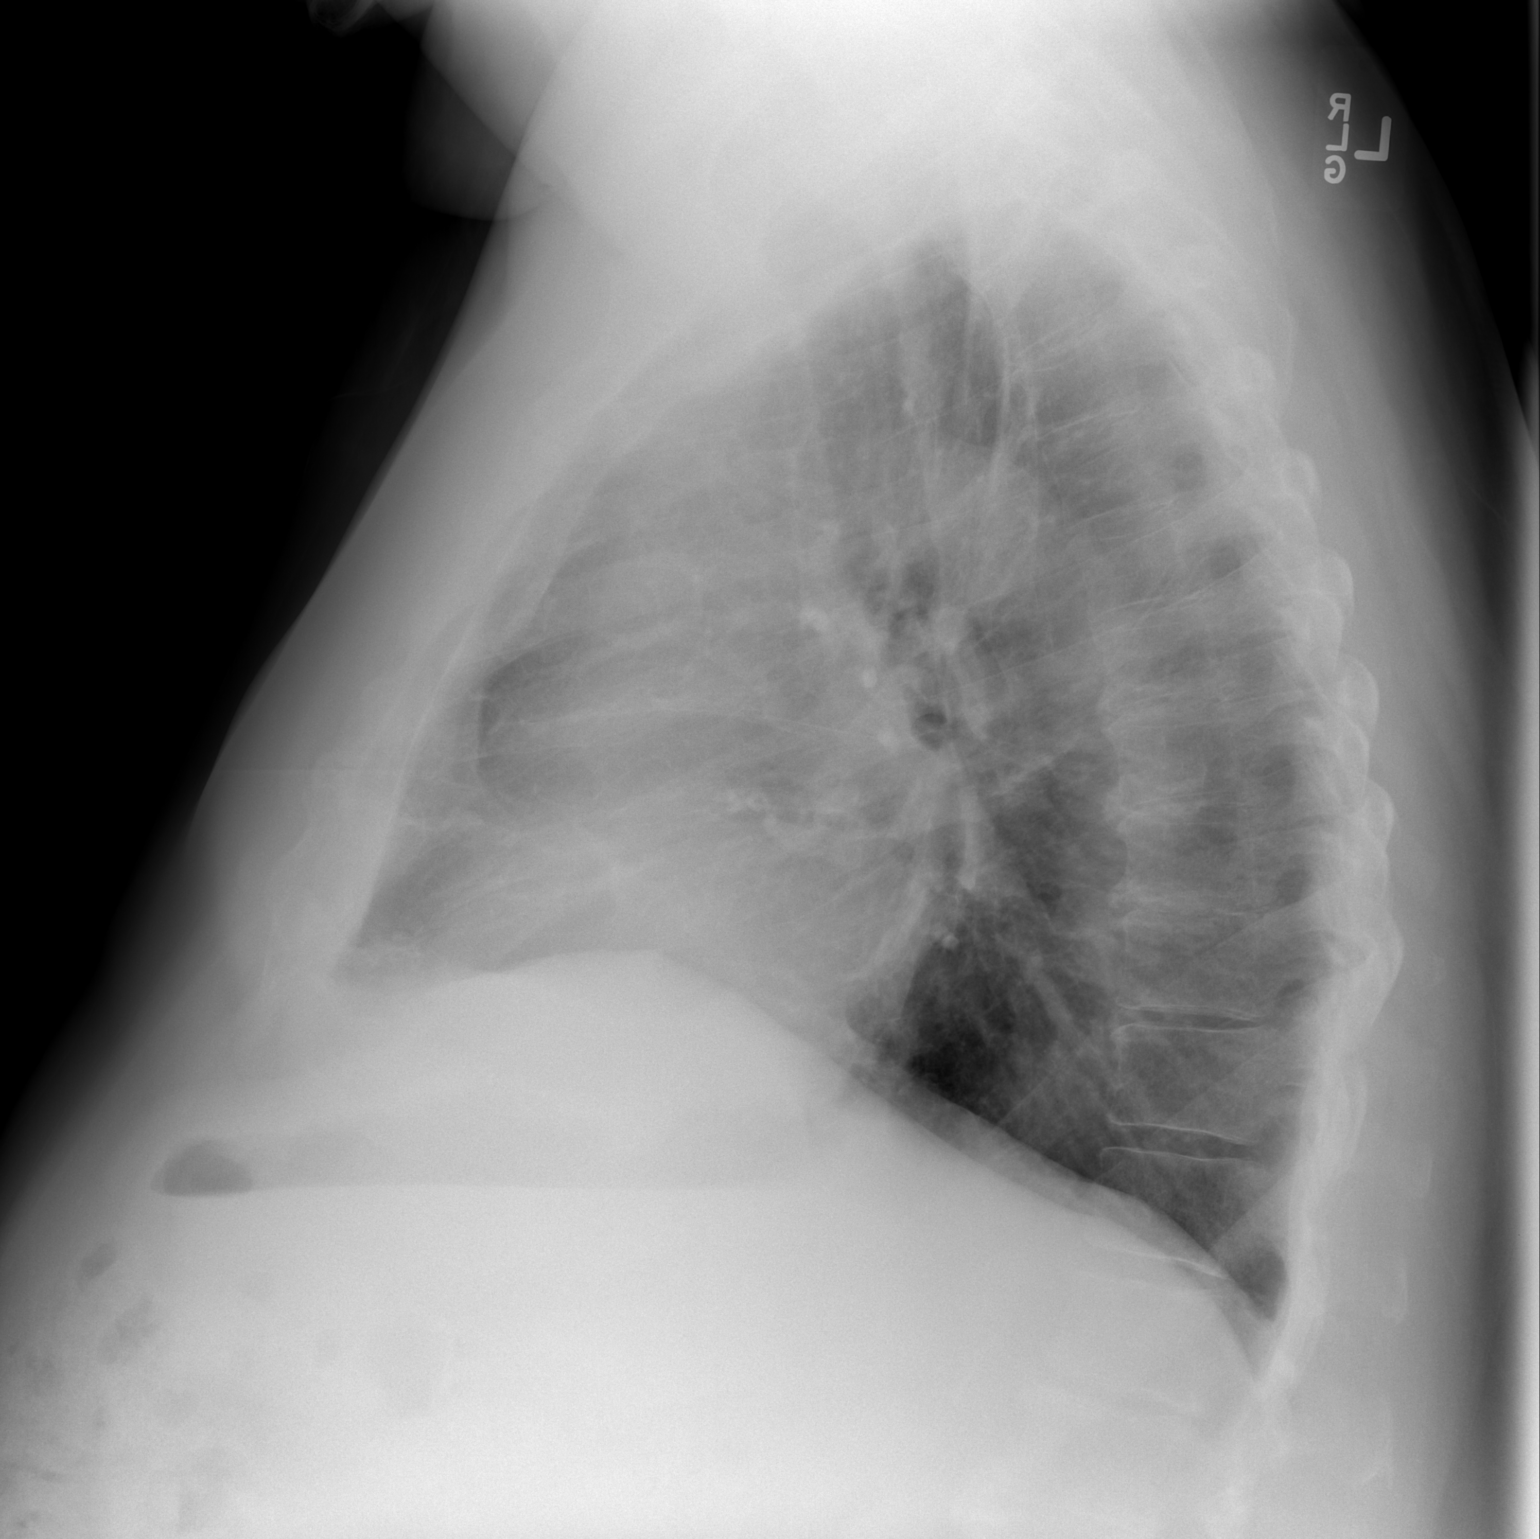

[2 of 2 positions shown; findings below may reference images not displayed]

FINDINGS: Mediastinum and hilar structures are stable. Pleural thickening
consistent with scarring. Cardiomegaly with normal pulmonary
vascularity. No focal infiltrate. No pleural effusion or
pneumothorax. Prominent skin fold over the left chest again noted.
Thoracic spine degenerative change. No acute bony abnormality
identified .
IMPRESSION: No acute cardiopulmonary disease. No focal pulmonary abnormality
identified.

## 2017-08-05 DIAGNOSIS — Z23 Encounter for immunization: Secondary | ICD-10-CM | POA: Diagnosis not present

## 2017-12-26 DIAGNOSIS — I429 Cardiomyopathy, unspecified: Secondary | ICD-10-CM | POA: Diagnosis not present

## 2017-12-26 DIAGNOSIS — Z6841 Body Mass Index (BMI) 40.0 and over, adult: Secondary | ICD-10-CM | POA: Diagnosis not present

## 2017-12-26 DIAGNOSIS — N182 Chronic kidney disease, stage 2 (mild): Secondary | ICD-10-CM | POA: Diagnosis not present

## 2017-12-26 DIAGNOSIS — C649 Malignant neoplasm of unspecified kidney, except renal pelvis: Secondary | ICD-10-CM | POA: Diagnosis not present

## 2017-12-26 DIAGNOSIS — E1122 Type 2 diabetes mellitus with diabetic chronic kidney disease: Secondary | ICD-10-CM | POA: Diagnosis not present

## 2017-12-26 DIAGNOSIS — C61 Malignant neoplasm of prostate: Secondary | ICD-10-CM | POA: Diagnosis not present

## 2017-12-26 DIAGNOSIS — I130 Hypertensive heart and chronic kidney disease with heart failure and stage 1 through stage 4 chronic kidney disease, or unspecified chronic kidney disease: Secondary | ICD-10-CM | POA: Diagnosis not present

## 2017-12-26 DIAGNOSIS — I2510 Atherosclerotic heart disease of native coronary artery without angina pectoris: Secondary | ICD-10-CM | POA: Diagnosis not present

## 2018-01-10 DIAGNOSIS — C642 Malignant neoplasm of left kidney, except renal pelvis: Secondary | ICD-10-CM | POA: Diagnosis not present

## 2018-01-10 DIAGNOSIS — C61 Malignant neoplasm of prostate: Secondary | ICD-10-CM | POA: Diagnosis not present

## 2018-01-17 ENCOUNTER — Other Ambulatory Visit: Payer: Self-pay | Admitting: Urology

## 2018-01-17 ENCOUNTER — Ambulatory Visit (HOSPITAL_COMMUNITY)
Admission: RE | Admit: 2018-01-17 | Discharge: 2018-01-17 | Disposition: A | Discharge status: Home / Self Care | Payer: Medicare Other | Source: Ambulatory Visit | Attending: Urology | Admitting: Urology

## 2018-01-17 DIAGNOSIS — C649 Malignant neoplasm of unspecified kidney, except renal pelvis: Secondary | ICD-10-CM | POA: Diagnosis not present

## 2018-01-17 DIAGNOSIS — N35010 Post-traumatic urethral stricture, male, meatal: Secondary | ICD-10-CM | POA: Diagnosis not present

## 2018-01-17 DIAGNOSIS — C642 Malignant neoplasm of left kidney, except renal pelvis: Secondary | ICD-10-CM | POA: Diagnosis not present

## 2018-01-17 DIAGNOSIS — R3915 Urgency of urination: Secondary | ICD-10-CM | POA: Diagnosis not present

## 2018-01-17 DIAGNOSIS — R918 Other nonspecific abnormal finding of lung field: Secondary | ICD-10-CM | POA: Insufficient documentation

## 2018-01-17 DIAGNOSIS — C61 Malignant neoplasm of prostate: Secondary | ICD-10-CM | POA: Diagnosis not present

## 2018-01-17 IMAGING — DX DG CHEST 2V
2 series · 2 of 2 positions shown · non-contrast
Comparison: Chest x-ray dated [DATE] and chest CT dated
[DATE]

CLINICAL DATA: Follow-up of renal cell carcinoma.

EXAM:
CHEST  2 VIEW

[chest pa]
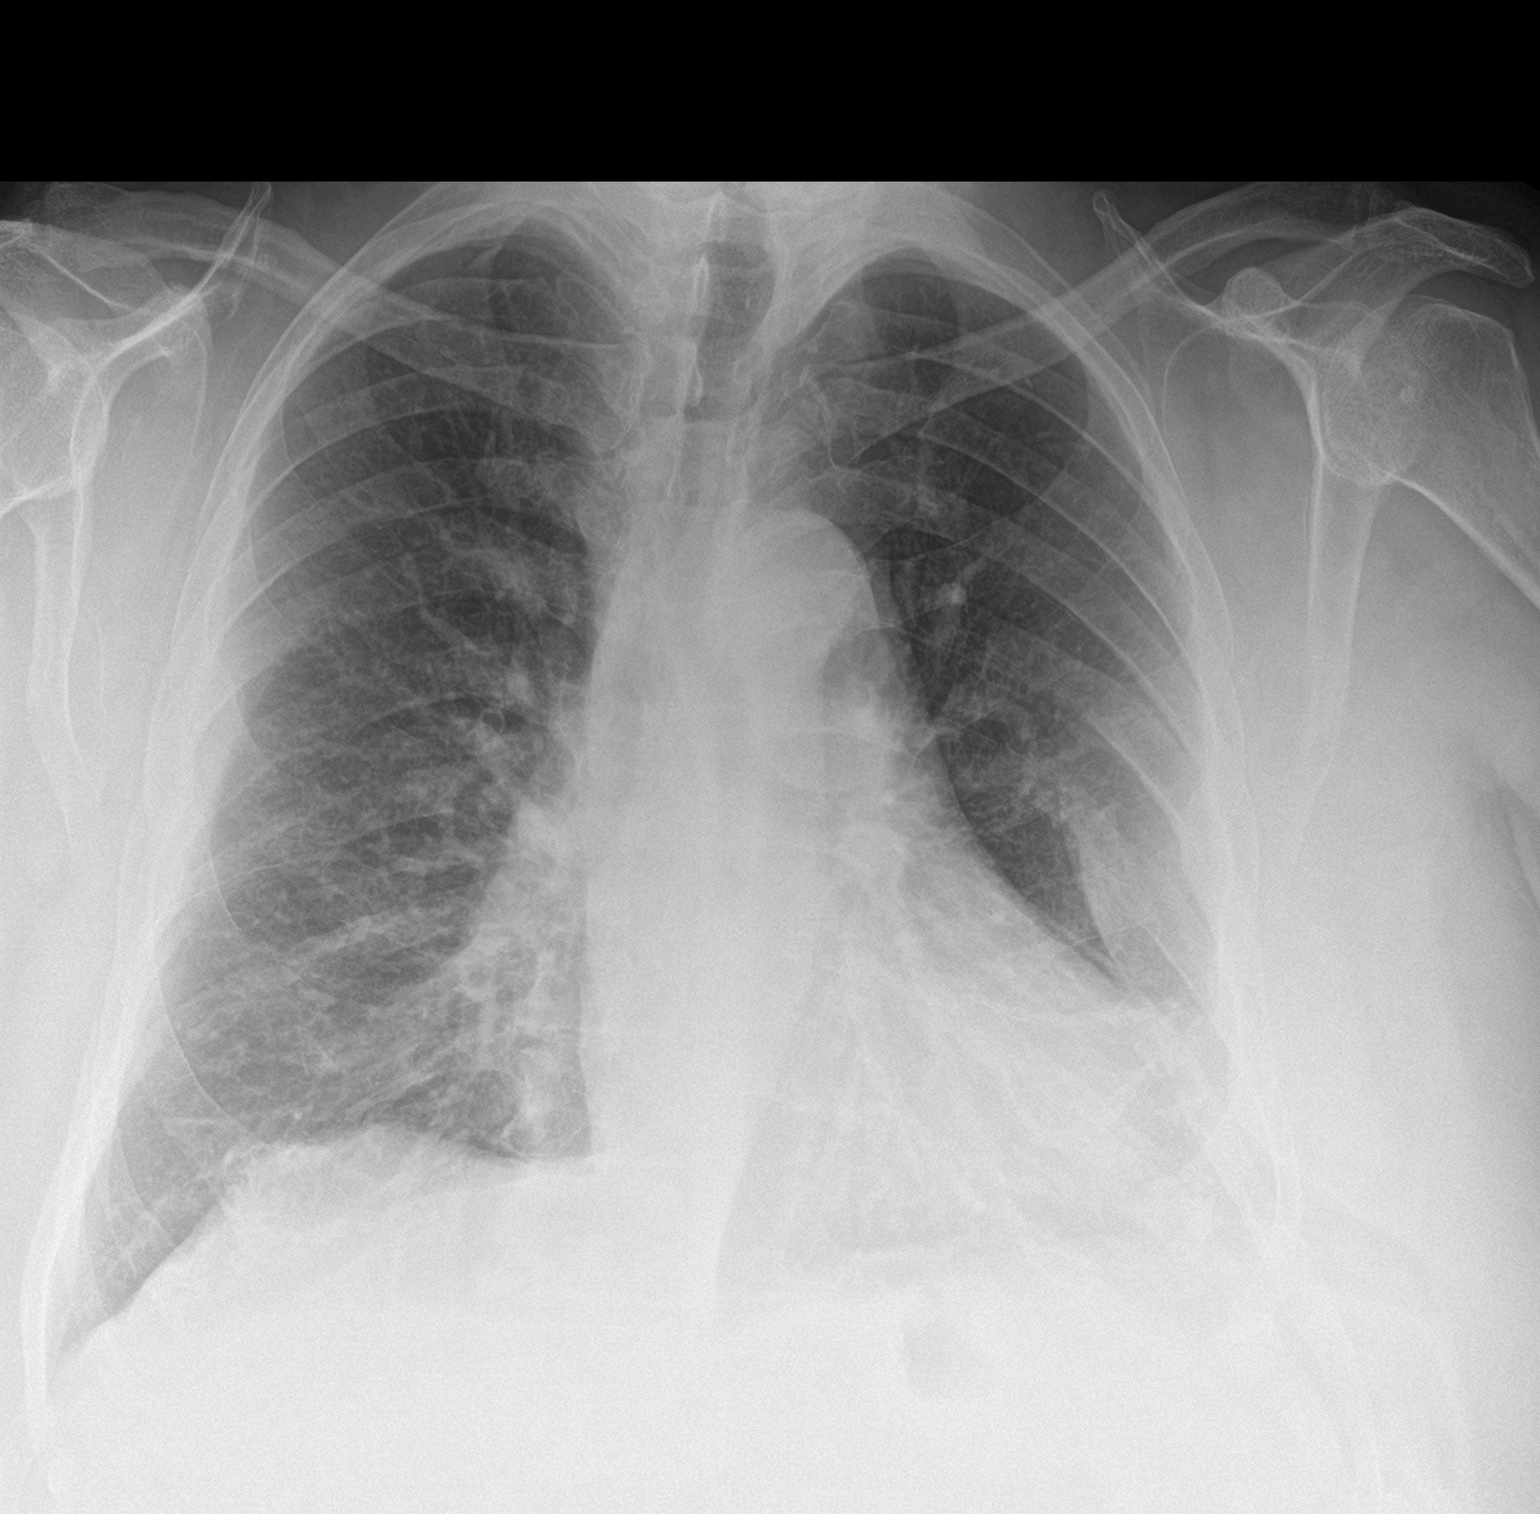

[chest lat]
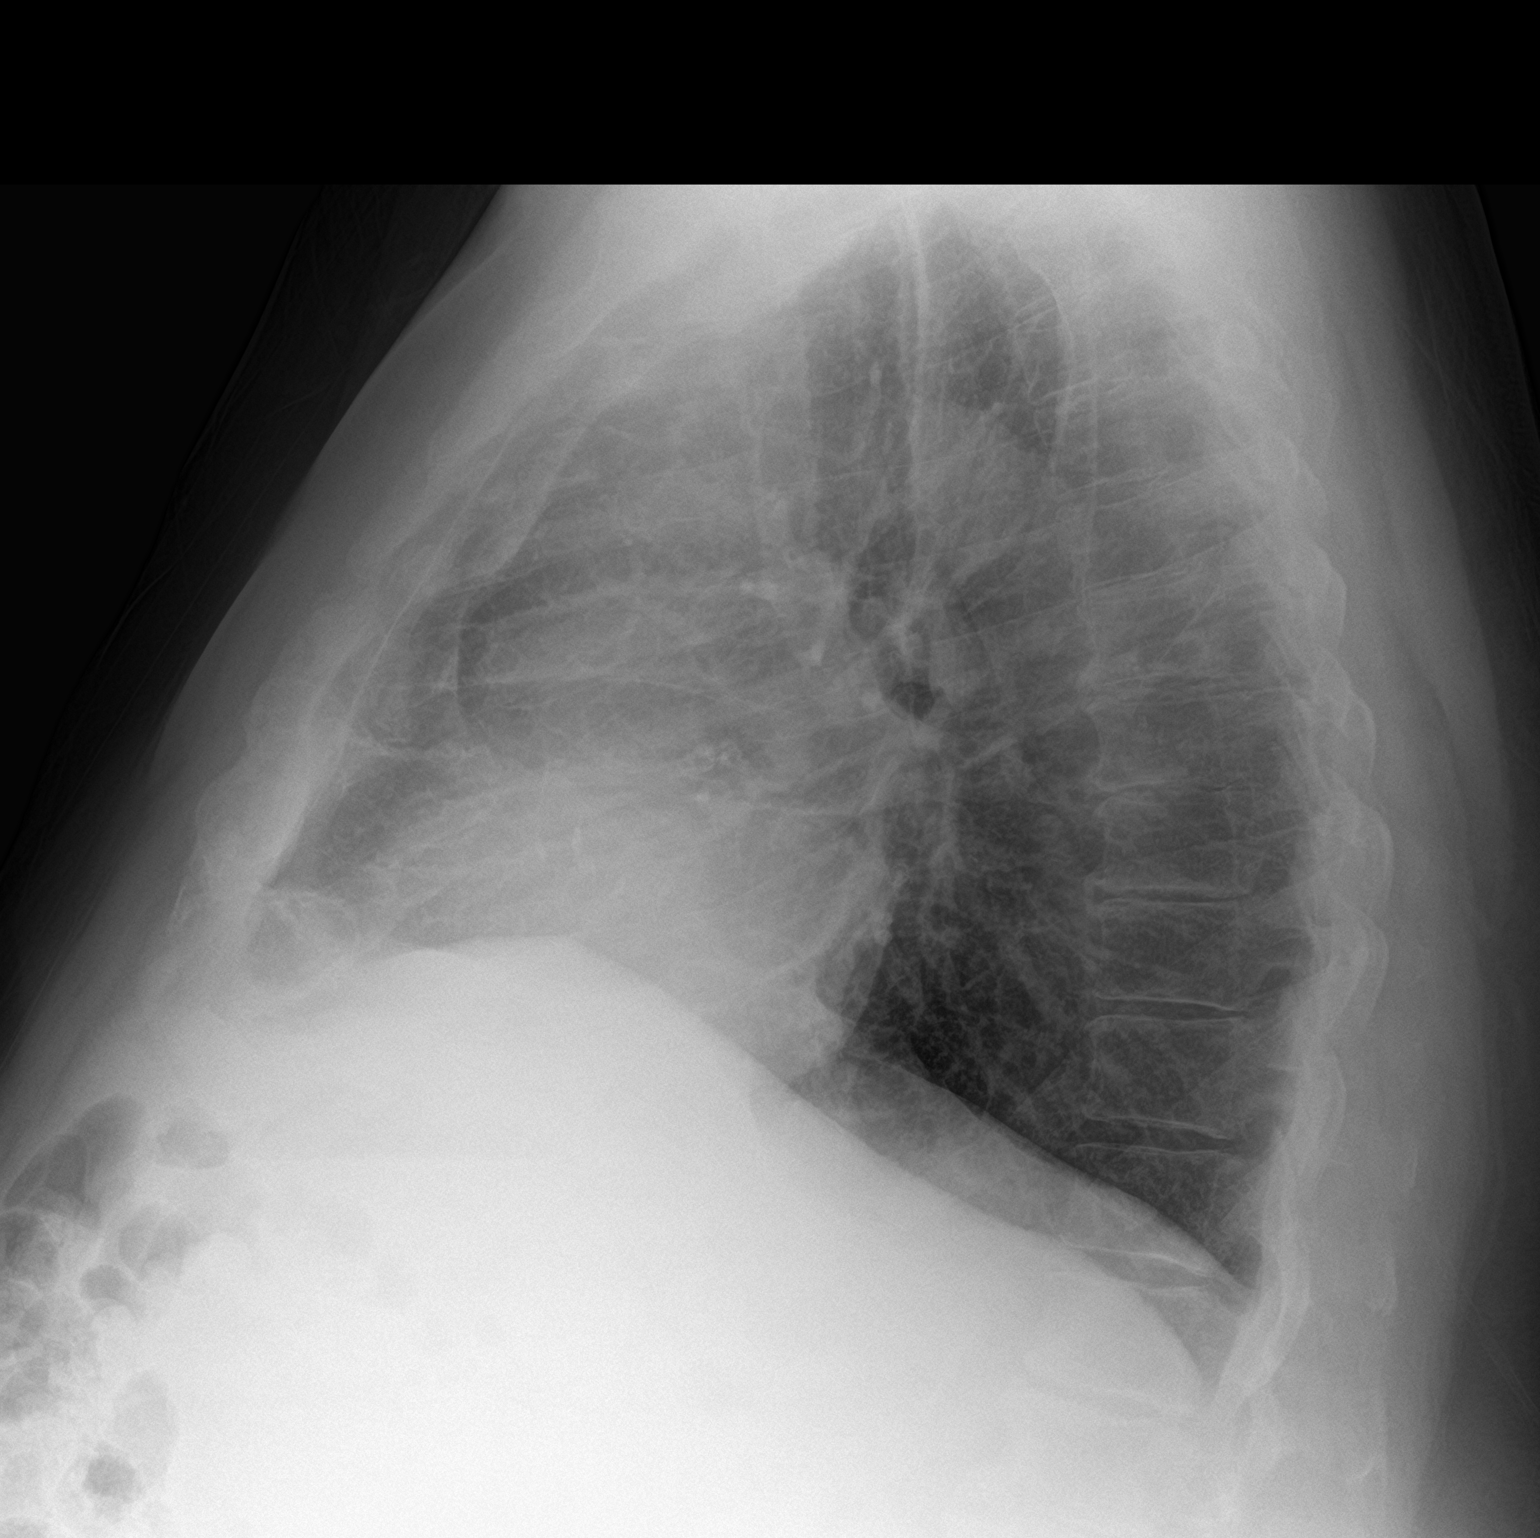

[2 of 2 positions shown; findings below may reference images not displayed]

FINDINGS: Heart size and pulmonary vascularity are normal. There is chronic
pleural thickening at the left lung base with multiple old healed
left rib fractures. No acute bone abnormality. No effusions. No mass
lesions.
IMPRESSION: No acute abnormality. Chronic pleural thickening at the left lung
base.

## 2018-04-04 DIAGNOSIS — L578 Other skin changes due to chronic exposure to nonionizing radiation: Secondary | ICD-10-CM | POA: Diagnosis not present

## 2018-04-04 DIAGNOSIS — C44619 Basal cell carcinoma of skin of left upper limb, including shoulder: Secondary | ICD-10-CM | POA: Diagnosis not present

## 2018-05-03 DIAGNOSIS — C44619 Basal cell carcinoma of skin of left upper limb, including shoulder: Secondary | ICD-10-CM | POA: Diagnosis not present

## 2018-05-03 DIAGNOSIS — Z85828 Personal history of other malignant neoplasm of skin: Secondary | ICD-10-CM | POA: Diagnosis not present

## 2018-05-10 DIAGNOSIS — C44619 Basal cell carcinoma of skin of left upper limb, including shoulder: Secondary | ICD-10-CM | POA: Diagnosis not present

## 2018-05-10 DIAGNOSIS — Z85828 Personal history of other malignant neoplasm of skin: Secondary | ICD-10-CM | POA: Diagnosis not present

## 2018-06-22 DIAGNOSIS — C642 Malignant neoplasm of left kidney, except renal pelvis: Secondary | ICD-10-CM | POA: Diagnosis not present

## 2018-06-22 DIAGNOSIS — K439 Ventral hernia without obstruction or gangrene: Secondary | ICD-10-CM | POA: Diagnosis not present

## 2018-06-29 ENCOUNTER — Ambulatory Visit (HOSPITAL_COMMUNITY)
Admission: RE | Admit: 2018-06-29 | Discharge: 2018-06-29 | Disposition: A | Discharge status: Home / Self Care | Payer: Medicare Other | Source: Ambulatory Visit | Attending: Urology | Admitting: Urology

## 2018-06-29 ENCOUNTER — Other Ambulatory Visit: Payer: Self-pay | Admitting: Urology

## 2018-06-29 DIAGNOSIS — C642 Malignant neoplasm of left kidney, except renal pelvis: Secondary | ICD-10-CM | POA: Diagnosis not present

## 2018-06-29 DIAGNOSIS — C61 Malignant neoplasm of prostate: Secondary | ICD-10-CM | POA: Diagnosis not present

## 2018-06-29 DIAGNOSIS — C649 Malignant neoplasm of unspecified kidney, except renal pelvis: Secondary | ICD-10-CM | POA: Diagnosis not present

## 2018-06-29 DIAGNOSIS — R3915 Urgency of urination: Secondary | ICD-10-CM | POA: Diagnosis not present

## 2018-06-29 IMAGING — CR DG CHEST 2V
2 series · 2 of 2 positions shown · non-contrast
Comparison: [DATE], [DATE] and earlier.

CLINICAL DATA: Partial LEFT nephrectomy in [DATE] for renal
cell carcinoma. Annual evaluation.

EXAM:
CHEST - 2 VIEW

[w chest pa]
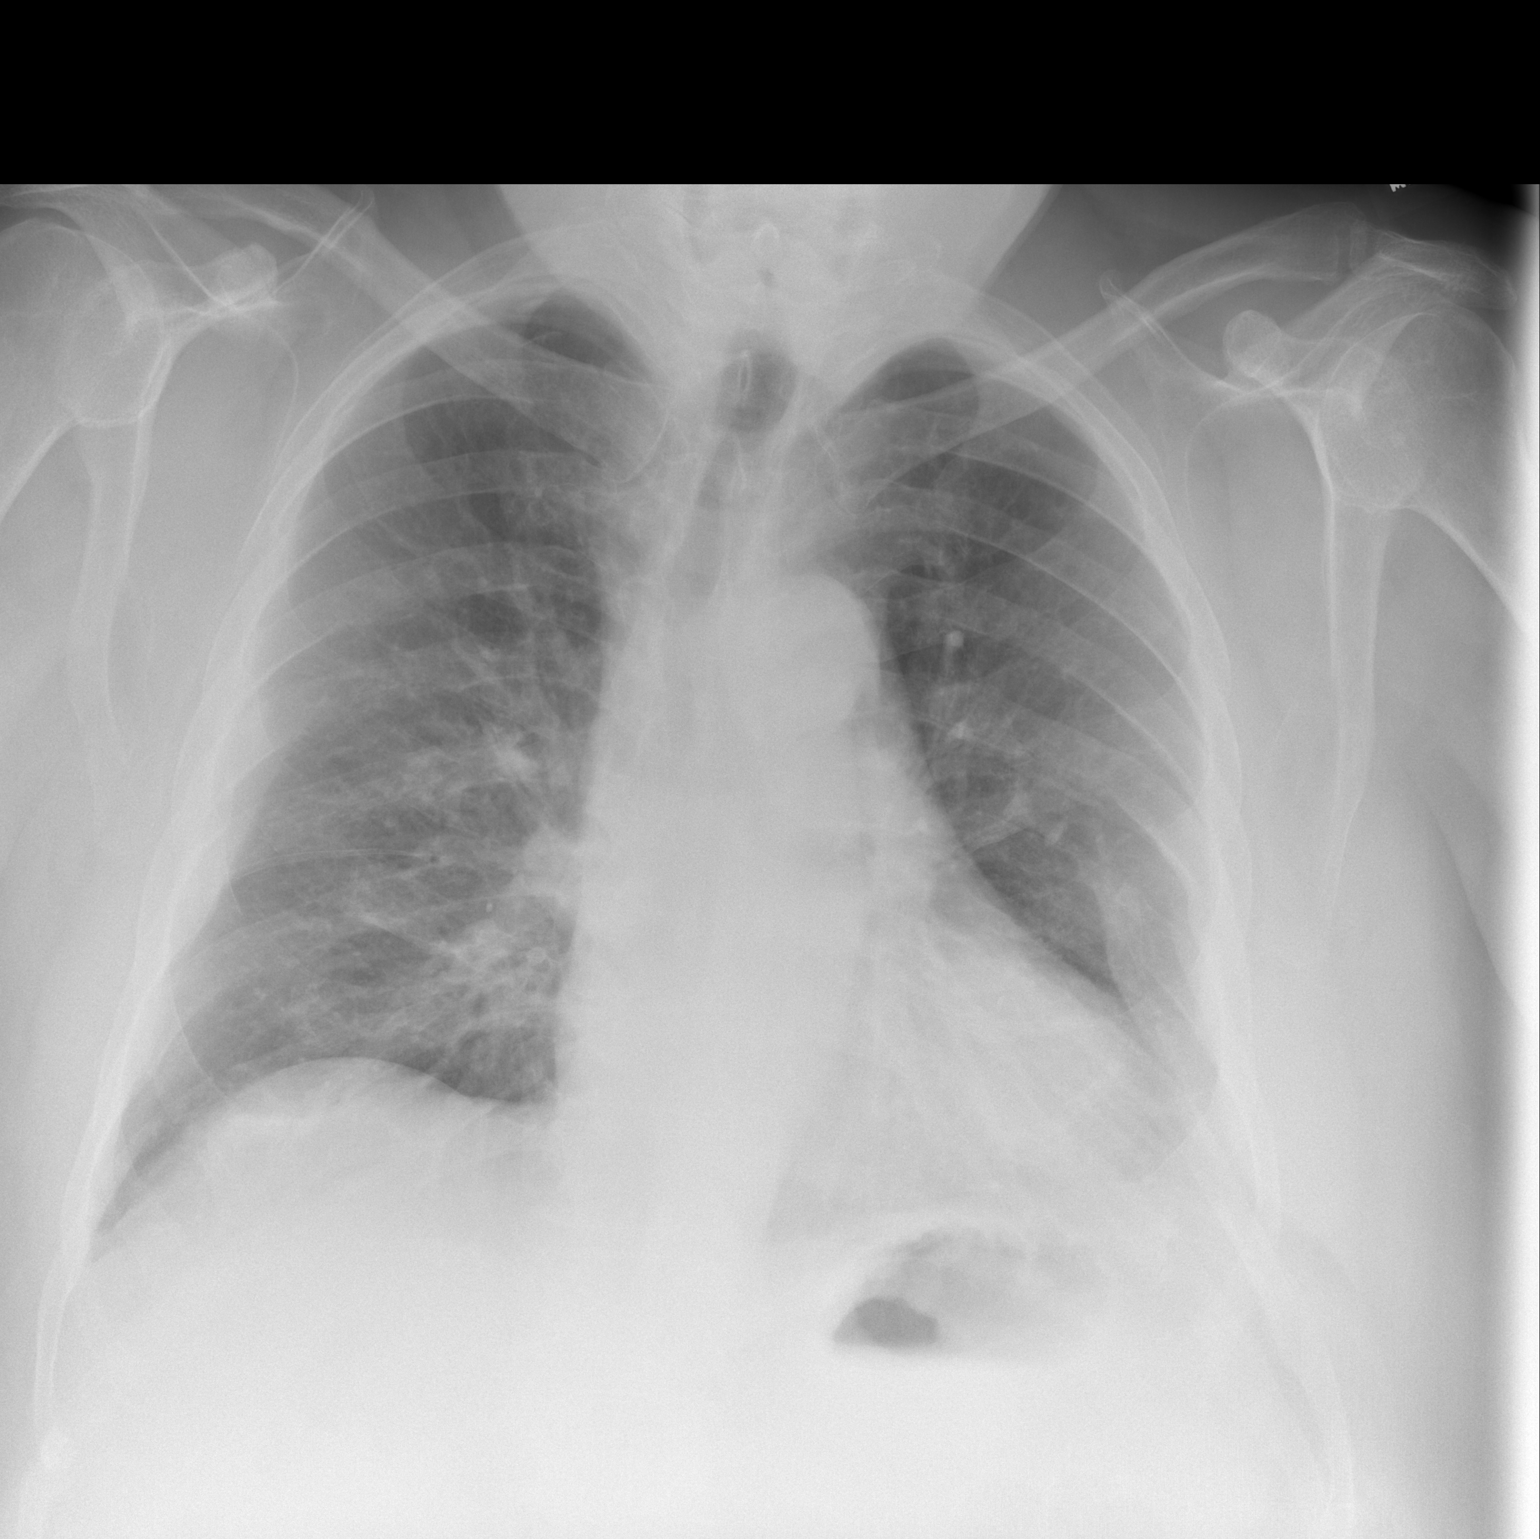

[w chest lat]
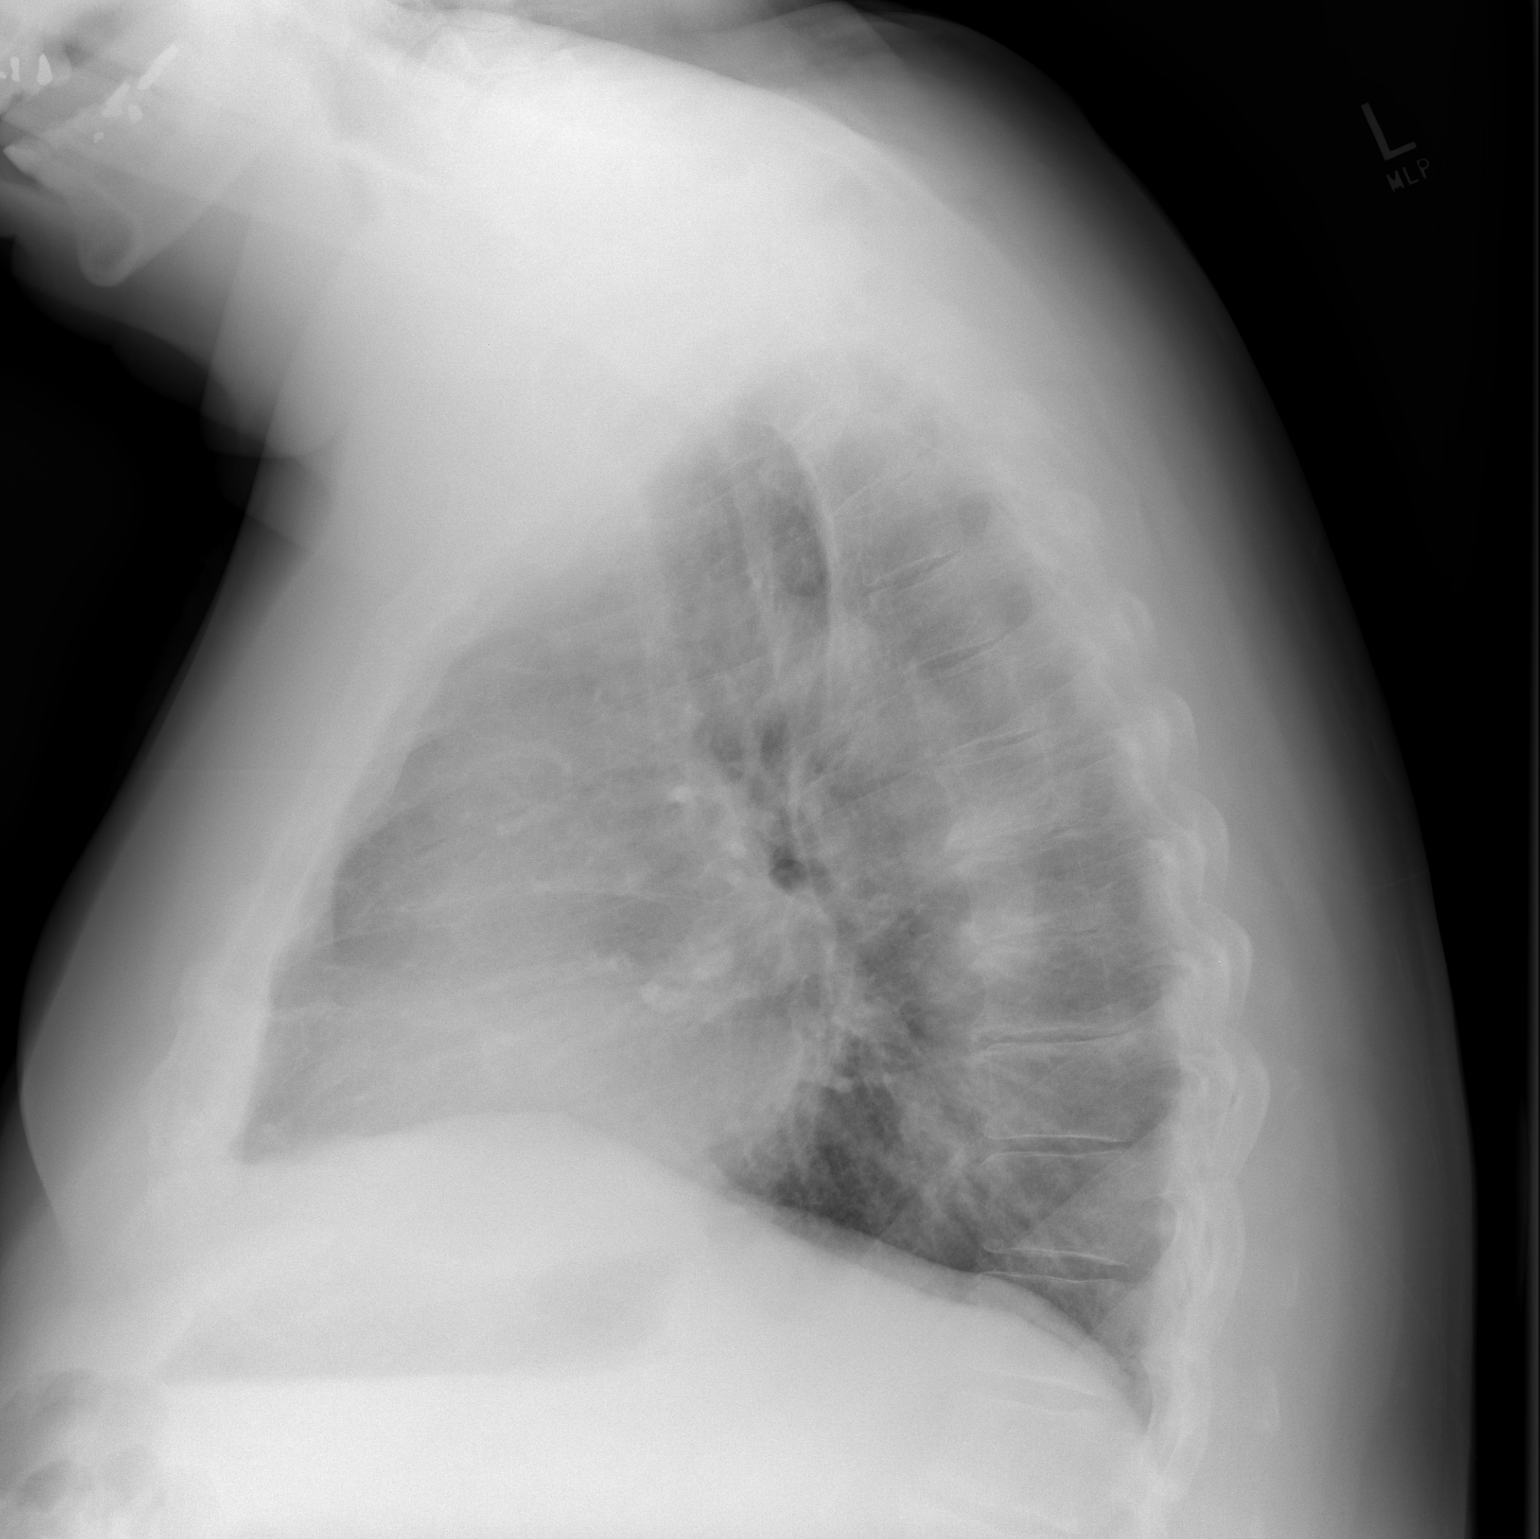

[2 of 2 positions shown; findings below may reference images not displayed]

FINDINGS: Cardiac silhouette upper normal in size, unchanged. Thoracic aorta
mildly tortuous and atherosclerotic, unchanged. Hilar and
mediastinal contours otherwise unremarkable. Pleural thickening in
the LATERAL aspect of both hemithoraces related to multiple remote
healed BILATERAL rib fractures. Lungs clear. Pulmonary vascularity
normal. Bronchovascular markings normal, improved since the
[DATE] examination. No pleural effusions. Degenerative changes
involving the thoracic spine.
IMPRESSION: No acute cardiopulmonary disease. No evidence of metastatic disease.

## 2018-07-03 DIAGNOSIS — I10 Essential (primary) hypertension: Secondary | ICD-10-CM | POA: Diagnosis not present

## 2018-07-03 DIAGNOSIS — M109 Gout, unspecified: Secondary | ICD-10-CM | POA: Diagnosis not present

## 2018-07-03 DIAGNOSIS — Z125 Encounter for screening for malignant neoplasm of prostate: Secondary | ICD-10-CM | POA: Diagnosis not present

## 2018-07-03 DIAGNOSIS — E7849 Other hyperlipidemia: Secondary | ICD-10-CM | POA: Diagnosis not present

## 2018-07-03 DIAGNOSIS — E1122 Type 2 diabetes mellitus with diabetic chronic kidney disease: Secondary | ICD-10-CM | POA: Diagnosis not present

## 2018-07-10 DIAGNOSIS — Z1389 Encounter for screening for other disorder: Secondary | ICD-10-CM | POA: Diagnosis not present

## 2018-07-10 DIAGNOSIS — Z6841 Body Mass Index (BMI) 40.0 and over, adult: Secondary | ICD-10-CM | POA: Diagnosis not present

## 2018-07-10 DIAGNOSIS — I2510 Atherosclerotic heart disease of native coronary artery without angina pectoris: Secondary | ICD-10-CM | POA: Diagnosis not present

## 2018-07-10 DIAGNOSIS — I700 Atherosclerosis of aorta: Secondary | ICD-10-CM | POA: Diagnosis not present

## 2018-07-10 DIAGNOSIS — N182 Chronic kidney disease, stage 2 (mild): Secondary | ICD-10-CM | POA: Diagnosis not present

## 2018-07-10 DIAGNOSIS — E1122 Type 2 diabetes mellitus with diabetic chronic kidney disease: Secondary | ICD-10-CM | POA: Diagnosis not present

## 2018-07-10 DIAGNOSIS — E7849 Other hyperlipidemia: Secondary | ICD-10-CM | POA: Diagnosis not present

## 2018-07-10 DIAGNOSIS — C61 Malignant neoplasm of prostate: Secondary | ICD-10-CM | POA: Diagnosis not present

## 2018-07-10 DIAGNOSIS — I130 Hypertensive heart and chronic kidney disease with heart failure and stage 1 through stage 4 chronic kidney disease, or unspecified chronic kidney disease: Secondary | ICD-10-CM | POA: Diagnosis not present

## 2018-07-10 DIAGNOSIS — Z0000 Encounter for general adult medical examination without abnormal findings: Secondary | ICD-10-CM | POA: Diagnosis not present

## 2018-07-10 DIAGNOSIS — K148 Other diseases of tongue: Secondary | ICD-10-CM | POA: Diagnosis not present

## 2018-07-10 DIAGNOSIS — C649 Malignant neoplasm of unspecified kidney, except renal pelvis: Secondary | ICD-10-CM | POA: Diagnosis not present

## 2018-07-16 ENCOUNTER — Encounter: Payer: Self-pay | Admitting: Gastroenterology

## 2018-07-16 DIAGNOSIS — Z1212 Encounter for screening for malignant neoplasm of rectum: Secondary | ICD-10-CM | POA: Diagnosis not present

## 2018-08-04 DIAGNOSIS — Z23 Encounter for immunization: Secondary | ICD-10-CM | POA: Diagnosis not present

## 2018-08-14 DIAGNOSIS — H6123 Impacted cerumen, bilateral: Secondary | ICD-10-CM | POA: Diagnosis not present

## 2018-08-14 DIAGNOSIS — H900 Conductive hearing loss, bilateral: Secondary | ICD-10-CM | POA: Diagnosis not present

## 2018-08-29 ENCOUNTER — Ambulatory Visit (AMBULATORY_SURGERY_CENTER): Payer: Self-pay | Admitting: *Deleted

## 2018-08-29 VITALS — Ht 70.0 in | Wt 300.0 lb

## 2018-08-29 DIAGNOSIS — Z86010 Personal history of colonic polyps: Secondary | ICD-10-CM

## 2018-08-29 MED ORDER — NA SULFATE-K SULFATE-MG SULF 17.5-3.13-1.6 GM/177ML PO SOLN: 1.0000 | Freq: Once | ORAL | 0 refills | Status: AC

## 2018-08-29 NOTE — Progress Notes (Signed)
No egg or soy allergy known to patient   issues with past sedation with any surgeries  or procedures of PONV , no intubation problems  No diet pills per patient No home 02 use per patient  No blood thinners per patient  Pt denies issues with constipation  No A fib or A flutter  EMMI video sent to pt's e mail  On vaccine trial for c difficile per pt- he states he has no c diff issues -  But on the vaccine trial - pt states he has never had c diff- [facility] [place] informed - states make half sheet

## 2018-09-12 ENCOUNTER — Ambulatory Visit (AMBULATORY_SURGERY_CENTER): Payer: Medicare Other | Admitting: Gastroenterology

## 2018-09-12 ENCOUNTER — Encounter: Payer: Self-pay | Admitting: Gastroenterology

## 2018-09-12 VITALS — BP 115/71 | HR 74 | Temp 98.4°F | Resp 16 | Ht 70.0 in | Wt 300.0 lb

## 2018-09-12 DIAGNOSIS — D124 Benign neoplasm of descending colon: Secondary | ICD-10-CM | POA: Diagnosis not present

## 2018-09-12 DIAGNOSIS — K621 Rectal polyp: Secondary | ICD-10-CM

## 2018-09-12 DIAGNOSIS — D122 Benign neoplasm of ascending colon: Secondary | ICD-10-CM | POA: Diagnosis not present

## 2018-09-12 DIAGNOSIS — D128 Benign neoplasm of rectum: Secondary | ICD-10-CM

## 2018-09-12 DIAGNOSIS — Z860100 Personal history of colon polyps, unspecified: Secondary | ICD-10-CM

## 2018-09-12 DIAGNOSIS — K635 Polyp of colon: Secondary | ICD-10-CM

## 2018-09-12 DIAGNOSIS — I10 Essential (primary) hypertension: Secondary | ICD-10-CM | POA: Diagnosis not present

## 2018-09-12 DIAGNOSIS — Z6841 Body Mass Index (BMI) 40.0 and over, adult: Secondary | ICD-10-CM | POA: Diagnosis not present

## 2018-09-12 DIAGNOSIS — D123 Benign neoplasm of transverse colon: Secondary | ICD-10-CM | POA: Diagnosis not present

## 2018-09-12 DIAGNOSIS — K219 Gastro-esophageal reflux disease without esophagitis: Secondary | ICD-10-CM | POA: Diagnosis not present

## 2018-09-12 MED ORDER — SODIUM CHLORIDE 0.9 % IV SOLN: 500.0000 mL | Freq: Once | INTRAVENOUS | Status: DC

## 2018-09-12 NOTE — Progress Notes (Signed)
Called to room to assist during endoscopic procedure.  Patient ID and intended procedure confirmed with present staff. Received instructions for my participation in the procedure from the performing physician.  

## 2018-09-12 NOTE — Patient Instructions (Signed)
[place]yp info given.   YOU HAD AN ENDOSCOPIC PROCEDURE TODAY AT THE [facility]:   Refer to the procedure report that was given to you for any specific questions about what was found during the examination.  If the procedure report does not answer your questions, please call your gastroenterologist to clarify.  If you requested that your care partner not be given the details of your procedure findings, then the procedure report has been included in a sealed envelope for you to review at your convenience later.  YOU SHOULD EXPECT: Some feelings of bloating in the abdomen. Passage of more gas than usual.  Walking can help get rid of the air that was put into your GI tract during the procedure and reduce the bloating. If you had a lower endoscopy (such as a colonoscopy or flexible sigmoidoscopy) you may notice spotting of blood in your stool or on the toilet paper. If you underwent a bowel prep for your procedure, you may not have a normal bowel movement for a few days.  Please Note:  You might notice some irritation and congestion in your nose or some drainage.  This is from the oxygen used during your procedure.  There is no need for concern and it should clear up in a day or so.  SYMPTOMS TO REPORT IMMEDIATELY:   Following lower endoscopy (colonoscopy or flexible sigmoidoscopy):  Excessive amounts of blood in the stool  Significant tenderness or worsening of abdominal pains  Swelling of the abdomen that is new, acute  Fever of 100F or higher  \ For urgent or emergent issues, a gastroenterologist can be reached at any hour by calling [phone].   DIET:  We do recommend a small meal at first, but then you may proceed to your regular diet.  Drink plenty of fluids but you should avoid alcoholic beverages for 24 hours.  ACTIVITY:  You should plan to take it easy for the rest of today and you should NOT DRIVE or use heavy machinery until tomorrow (because of the sedation medicines  used during the test).    FOLLOW UP: Our staff will call the number listed on your records the next business day following your procedure to check on you and address any questions or concerns that you may have regarding the information given to you following your procedure. If we do not reach you, we will leave a message.  However, if you are feeling well and you are not experiencing any problems, there is no need to return our call.  We will assume that you have returned to your regular daily activities without incident.  If any biopsies were taken you will be contacted by phone or by letter within the next 1-3 weeks.  Please call us at [phone] if you have not heard about the biopsies in 3 weeks.    SIGNATURES/CONFIDENTIALITY: You and/or your care partner have signed paperwork which will be entered into your electronic medical record.  These signatures attest to the fact that that the information above on your After Visit Summary has been reviewed and is understood.  Full responsibility of the confidentiality of this discharge information lies with you and/or your care-partner. 

## 2018-09-12 NOTE — Progress Notes (Signed)
Pt's states no medical or surgical changes since previsit or office visit. 

## 2018-09-12 NOTE — Op Note (Signed)
[name] Procedure Date: 09/12/2018 7:54 AM MRN: [id number] Endoscopist: Sean [name] , MD Age: 72 Referring MD:  Date of Birth: 1946/03/31 Gender: Male Account #: [id number] Procedure:                Colonoscopy Indications:              Surveillance: Personal history of adenomatous                            polyps on last colonoscopy 5 years ago (TA x 2, <                            12mm, 07/2013) Medicines:                Monitored Anesthesia Care Procedure:                Pre-Anesthesia Assessment:                           - Prior to the procedure, a History and Physical                            was performed, and patient medications and                            allergies were reviewed. The patient's tolerance of                            previous anesthesia was also reviewed. The risks                            and benefits of the procedure and the sedation                            options and risks were discussed with the patient.                            All questions were answered, and informed consent                            was obtained. Anticoagulants: The patient has taken                            aspirin. It was decided not to withhold this                            medication prior to the procedure. ASA Grade                            Assessment: III - A patient with severe systemic                            disease. After reviewing the risks and benefits,  the patient was deemed in satisfactory condition to                            undergo the procedure.                           After obtaining informed consent, the colonoscope                            was passed under direct vision. Throughout the                            procedure, the patient's blood pressure, pulse, and                            oxygen saturations were monitored continuously. The     Colonoscope was introduced through the anus and                            advanced to the the cecum, identified by                            appendiceal orifice and ileocecal valve. The                            colonoscopy was performed without difficulty. The                            patient tolerated the procedure well. The quality                            of the bowel preparation was good. The ileocecal                            valve, appendiceal orifice, and rectum were                            photographed. The quality of the bowel preparation                            was evaluated using the BBPS [facility] Bowel                            Preparation Scale) with scores of: Right Colon = 2,                            Transverse Colon = 2 and Left Colon = 2. The total                            BBPS score equals 6.,after lavage. Scope In: 8:20:44 AM Scope Out: 8:42:48 AM Scope Withdrawal Time: 0 hours 18 minutes 2 seconds  Total Procedure Duration: 0 hours 22 minutes 4 seconds  Findings:  The perianal and digital rectal examinations were                            normal.                           A 8 mm polyp was found in the ascending colon. The                            polyp was sessile. The polyp was removed with a                            cold snare. Resection and retrieval were complete.                           Two sessile polyps were found in the descending                            colon and distal transverse colon. The polyps were                            3 to 4 mm in size. These polyps were removed with a                            cold snare. Resection and retrieval were complete.                           Two sessile polyps were found in the rectum. The                            polyps were 2 to 4 mm in size. These polyps were                            removed with a cold snare. Resection was complete,                            but  the polyp tissue was only partially retrieved.                           The exam was otherwise without abnormality on                            direct and retroflexion views. Complications:            No immediate complications. Estimated Blood Loss:     Estimated blood loss was minimal. Impression:               - One 8 mm polyp in the ascending colon, removed                            with a cold snare. Resected and retrieved.                           -  Two 3 to 4 mm polyps in the descending colon and                            in the distal transverse colon, removed with a cold                            snare. Resected and retrieved.                           - Two 2 to 4 mm polyps in the rectum, removed with                            a cold snare. Complete resection. Partial retrieval.                           - The examination was otherwise normal on direct                            and retroflexion views. Recommendation:           - Patient has a contact number available for                            emergencies. The signs and symptoms of potential                            delayed complications were discussed with the                            patient. Return to normal activities tomorrow.                            Written discharge instructions were provided to the                            patient.                           - Resume previous diet.                           - Continue present medications.                           - Await pathology results.                           - Repeat colonoscopy is recommended for                            surveillance. The colonoscopy date will be                            determined after pathology results from today's  exam become available for review. Sean [name] L. Loletha Carrow, MD 09/12/2018 8:48:38 AM This report has been signed electronically.

## 2018-09-12 NOTE — Progress Notes (Signed)
Report given to PACU, vss 

## 2018-09-13 ENCOUNTER — Telehealth: Payer: Self-pay

## 2018-09-13 NOTE — Telephone Encounter (Signed)
  Follow up Call-  Call back number 09/12/2018  Post procedure Call Back phone  # [phone]  Permission to leave phone message Yes  Some recent data might be hidden     Patient questions:  Do you have a fever, pain , or abdominal swelling? No. Pain Score  0 *  Have you tolerated food without any problems? Yes.    Have you been able to return to your normal activities? Yes.    Do you have any questions about your discharge instructions: Diet   No. Medications  No. Follow up visit  No.  Do you have questions or concerns about your Care? No.  Actions: * If pain score is 4 or above: No action needed, pain <4.

## 2018-09-18 ENCOUNTER — Encounter: Payer: Self-pay | Admitting: Gastroenterology

## 2018-11-12 DIAGNOSIS — Z85828 Personal history of other malignant neoplasm of skin: Secondary | ICD-10-CM | POA: Diagnosis not present

## 2018-11-12 DIAGNOSIS — L821 Other seborrheic keratosis: Secondary | ICD-10-CM | POA: Diagnosis not present

## 2018-11-12 DIAGNOSIS — D225 Melanocytic nevi of trunk: Secondary | ICD-10-CM | POA: Diagnosis not present

## 2018-11-12 DIAGNOSIS — L814 Other melanin hyperpigmentation: Secondary | ICD-10-CM | POA: Diagnosis not present

## 2018-11-12 DIAGNOSIS — D1801 Hemangioma of skin and subcutaneous tissue: Secondary | ICD-10-CM | POA: Diagnosis not present

## 2018-12-17 DIAGNOSIS — C642 Malignant neoplasm of left kidney, except renal pelvis: Secondary | ICD-10-CM | POA: Diagnosis not present

## 2018-12-24 DIAGNOSIS — N200 Calculus of kidney: Secondary | ICD-10-CM | POA: Diagnosis not present

## 2018-12-24 DIAGNOSIS — C642 Malignant neoplasm of left kidney, except renal pelvis: Secondary | ICD-10-CM | POA: Diagnosis not present

## 2018-12-28 ENCOUNTER — Other Ambulatory Visit (HOSPITAL_COMMUNITY): Payer: Self-pay | Admitting: Urology

## 2018-12-28 ENCOUNTER — Ambulatory Visit (HOSPITAL_COMMUNITY)
Admission: RE | Admit: 2018-12-28 | Discharge: 2018-12-28 | Disposition: A | Discharge status: Home / Self Care | Payer: Medicare Other | Source: Ambulatory Visit | Attending: Urology | Admitting: Urology

## 2018-12-28 DIAGNOSIS — Z85528 Personal history of other malignant neoplasm of kidney: Secondary | ICD-10-CM | POA: Diagnosis not present

## 2018-12-28 DIAGNOSIS — N35010 Post-traumatic urethral stricture, male, meatal: Secondary | ICD-10-CM | POA: Diagnosis not present

## 2018-12-28 DIAGNOSIS — C642 Malignant neoplasm of left kidney, except renal pelvis: Secondary | ICD-10-CM | POA: Diagnosis not present

## 2018-12-28 DIAGNOSIS — R3915 Urgency of urination: Secondary | ICD-10-CM | POA: Diagnosis not present

## 2018-12-28 DIAGNOSIS — Z8546 Personal history of malignant neoplasm of prostate: Secondary | ICD-10-CM | POA: Diagnosis not present

## 2018-12-28 IMAGING — DX DG CHEST 2V
2 series · 2 of 2 positions shown · non-contrast
Comparison: [DATE]

CLINICAL DATA: History of partial nephrectomy and renal cell
carcinoma, initial encounter

EXAM:
CHEST - 2 VIEW

[chest pa]
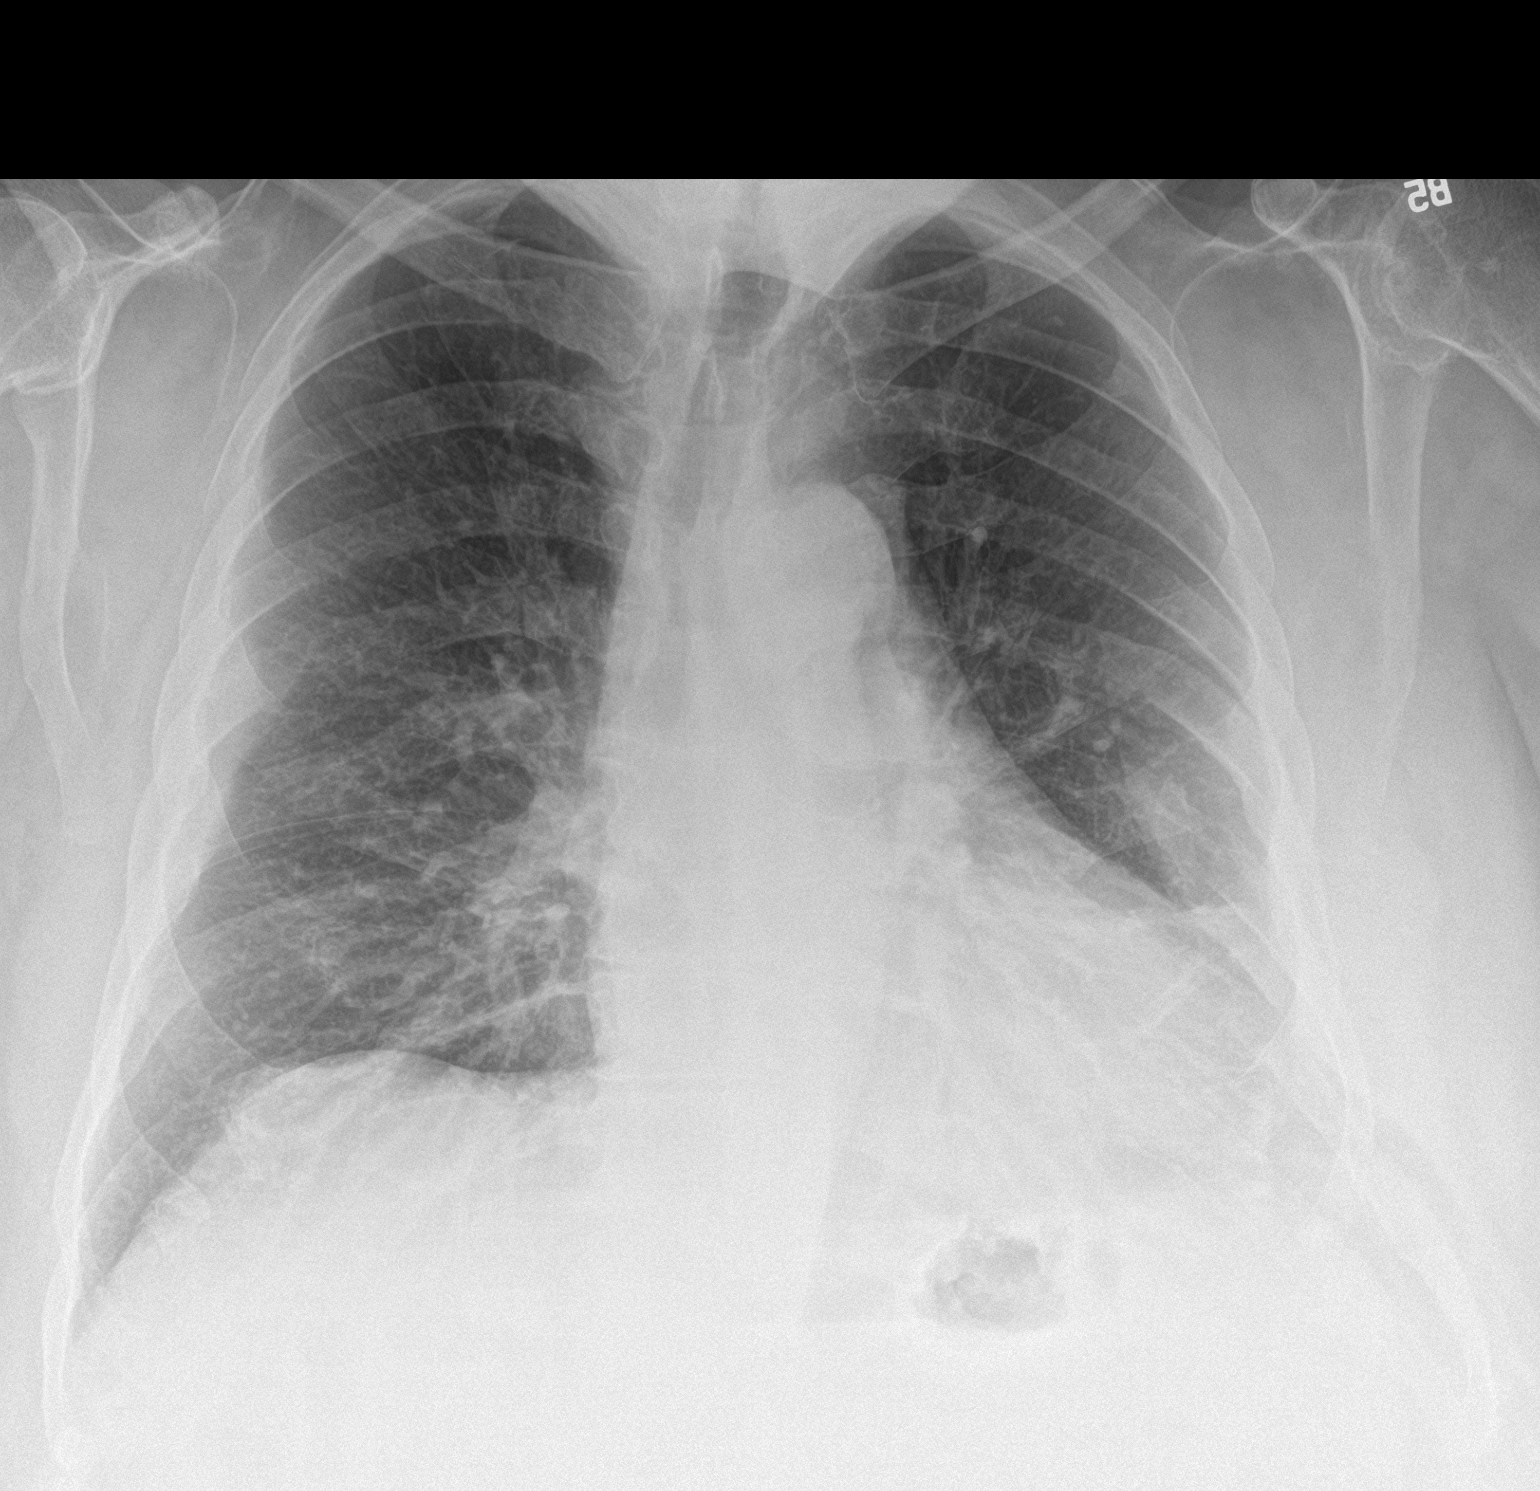

[chest lat]
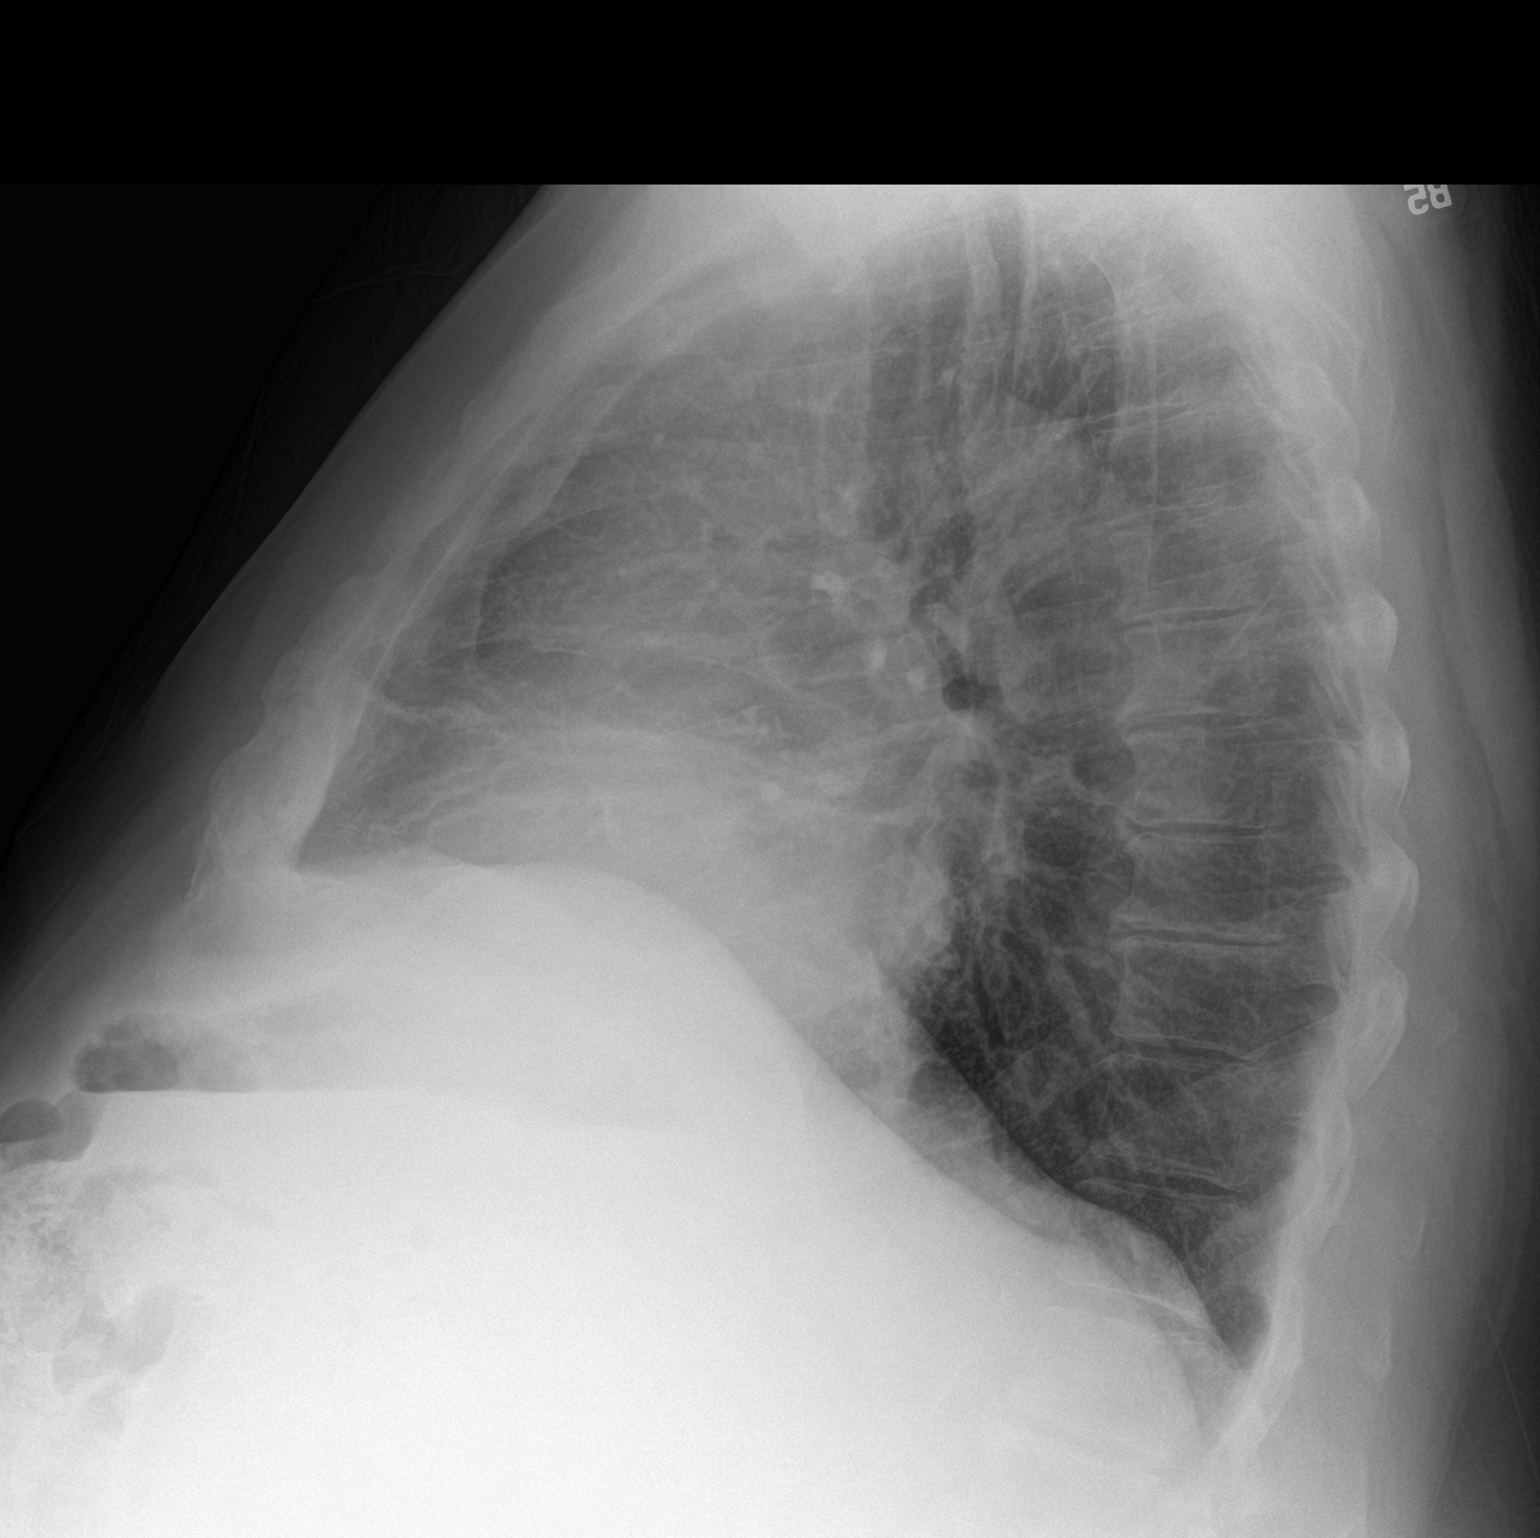

[2 of 2 positions shown; findings below may reference images not displayed]

FINDINGS: Cardiac shadow is stable. The lungs are well aerated bilaterally.
Pleural thickening is again noted on the right stable from the
previous exam. Old rib fractures on the left with scarring are
noted. No acute abnormality is seen.
IMPRESSION: No acute abnormality noted.

## 2019-01-08 DIAGNOSIS — M109 Gout, unspecified: Secondary | ICD-10-CM | POA: Diagnosis not present

## 2019-01-08 DIAGNOSIS — N182 Chronic kidney disease, stage 2 (mild): Secondary | ICD-10-CM | POA: Diagnosis not present

## 2019-01-08 DIAGNOSIS — I700 Atherosclerosis of aorta: Secondary | ICD-10-CM | POA: Diagnosis not present

## 2019-01-08 DIAGNOSIS — E1122 Type 2 diabetes mellitus with diabetic chronic kidney disease: Secondary | ICD-10-CM | POA: Diagnosis not present

## 2019-01-08 DIAGNOSIS — K148 Other diseases of tongue: Secondary | ICD-10-CM | POA: Diagnosis not present

## 2019-01-08 DIAGNOSIS — I130 Hypertensive heart and chronic kidney disease with heart failure and stage 1 through stage 4 chronic kidney disease, or unspecified chronic kidney disease: Secondary | ICD-10-CM | POA: Diagnosis not present

## 2019-01-08 DIAGNOSIS — I2510 Atherosclerotic heart disease of native coronary artery without angina pectoris: Secondary | ICD-10-CM | POA: Diagnosis not present

## 2019-01-08 DIAGNOSIS — C61 Malignant neoplasm of prostate: Secondary | ICD-10-CM | POA: Diagnosis not present

## 2019-01-08 DIAGNOSIS — Z6841 Body Mass Index (BMI) 40.0 and over, adult: Secondary | ICD-10-CM | POA: Diagnosis not present

## 2019-01-08 DIAGNOSIS — C642 Malignant neoplasm of left kidney, except renal pelvis: Secondary | ICD-10-CM | POA: Diagnosis not present

## 2019-01-08 DIAGNOSIS — I429 Cardiomyopathy, unspecified: Secondary | ICD-10-CM | POA: Diagnosis not present

## 2019-05-10 DIAGNOSIS — H25813 Combined forms of age-related cataract, bilateral: Secondary | ICD-10-CM | POA: Diagnosis not present

## 2019-05-10 DIAGNOSIS — H52203 Unspecified astigmatism, bilateral: Secondary | ICD-10-CM | POA: Diagnosis not present

## 2019-05-10 DIAGNOSIS — H524 Presbyopia: Secondary | ICD-10-CM | POA: Diagnosis not present

## 2019-05-10 DIAGNOSIS — D23121 Other benign neoplasm of skin of left upper eyelid, including canthus: Secondary | ICD-10-CM | POA: Diagnosis not present

## 2019-07-01 DIAGNOSIS — C61 Malignant neoplasm of prostate: Secondary | ICD-10-CM | POA: Diagnosis not present

## 2019-07-05 DIAGNOSIS — I10 Essential (primary) hypertension: Secondary | ICD-10-CM | POA: Diagnosis not present

## 2019-07-05 DIAGNOSIS — M109 Gout, unspecified: Secondary | ICD-10-CM | POA: Diagnosis not present

## 2019-07-05 DIAGNOSIS — R82998 Other abnormal findings in urine: Secondary | ICD-10-CM | POA: Diagnosis not present

## 2019-07-05 DIAGNOSIS — E1122 Type 2 diabetes mellitus with diabetic chronic kidney disease: Secondary | ICD-10-CM | POA: Diagnosis not present

## 2019-07-05 DIAGNOSIS — Z125 Encounter for screening for malignant neoplasm of prostate: Secondary | ICD-10-CM | POA: Diagnosis not present

## 2019-07-08 DIAGNOSIS — N200 Calculus of kidney: Secondary | ICD-10-CM | POA: Diagnosis not present

## 2019-07-08 DIAGNOSIS — C642 Malignant neoplasm of left kidney, except renal pelvis: Secondary | ICD-10-CM | POA: Diagnosis not present

## 2019-07-08 DIAGNOSIS — N281 Cyst of kidney, acquired: Secondary | ICD-10-CM | POA: Diagnosis not present

## 2019-07-12 DIAGNOSIS — I2510 Atherosclerotic heart disease of native coronary artery without angina pectoris: Secondary | ICD-10-CM | POA: Diagnosis not present

## 2019-07-12 DIAGNOSIS — C649 Malignant neoplasm of unspecified kidney, except renal pelvis: Secondary | ICD-10-CM | POA: Diagnosis not present

## 2019-07-12 DIAGNOSIS — E1122 Type 2 diabetes mellitus with diabetic chronic kidney disease: Secondary | ICD-10-CM | POA: Diagnosis not present

## 2019-07-12 DIAGNOSIS — Z0000 Encounter for general adult medical examination without abnormal findings: Secondary | ICD-10-CM | POA: Diagnosis not present

## 2019-07-12 DIAGNOSIS — I1310 Hypertensive heart and chronic kidney disease without heart failure, with stage 1 through stage 4 chronic kidney disease, or unspecified chronic kidney disease: Secondary | ICD-10-CM | POA: Diagnosis not present

## 2019-07-12 DIAGNOSIS — I700 Atherosclerosis of aorta: Secondary | ICD-10-CM | POA: Diagnosis not present

## 2019-07-12 DIAGNOSIS — N182 Chronic kidney disease, stage 2 (mild): Secondary | ICD-10-CM | POA: Diagnosis not present

## 2019-07-12 DIAGNOSIS — C61 Malignant neoplasm of prostate: Secondary | ICD-10-CM | POA: Diagnosis not present

## 2019-07-12 DIAGNOSIS — Z1331 Encounter for screening for depression: Secondary | ICD-10-CM | POA: Diagnosis not present

## 2019-07-12 DIAGNOSIS — E7849 Other hyperlipidemia: Secondary | ICD-10-CM | POA: Diagnosis not present

## 2019-07-12 DIAGNOSIS — Z23 Encounter for immunization: Secondary | ICD-10-CM | POA: Diagnosis not present

## 2019-07-12 DIAGNOSIS — I878 Other specified disorders of veins: Secondary | ICD-10-CM | POA: Diagnosis not present

## 2019-07-17 DIAGNOSIS — C61 Malignant neoplasm of prostate: Secondary | ICD-10-CM | POA: Diagnosis not present

## 2019-07-17 DIAGNOSIS — C642 Malignant neoplasm of left kidney, except renal pelvis: Secondary | ICD-10-CM | POA: Diagnosis not present

## 2019-07-17 DIAGNOSIS — R3915 Urgency of urination: Secondary | ICD-10-CM | POA: Diagnosis not present

## 2019-07-17 DIAGNOSIS — N35010 Post-traumatic urethral stricture, male, meatal: Secondary | ICD-10-CM | POA: Diagnosis not present

## 2020-01-04 ENCOUNTER — Ambulatory Visit: Payer: Medicare Other | Attending: Internal Medicine

## 2020-01-04 DIAGNOSIS — Z23 Encounter for immunization: Secondary | ICD-10-CM

## 2020-01-04 NOTE — Progress Notes (Signed)
   Covid-19 Vaccination Clinic  Name:  Sean [name]    MRN: [id number] DOB: [birth year]  01/04/2020  [name] was observed post Covid-19 immunization for 15 minutes without incidence. He was provided with Vaccine Information Sheet and instruction to access the V-Safe system.   [name] was instructed to call 911 with any severe reactions post vaccine: Marland Kitchen Difficulty breathing  . Swelling of your face and throat  . A fast heartbeat  . A bad rash all over your body  . Dizziness and weakness    Immunizations Administered    Name Date Dose VIS Date Route   Pfizer COVID-19 Vaccine 01/04/2020 11:06 AM 0.3 mL 11/01/2019 Intramuscular   Manufacturer: [facility]   Lot: [id number]   [place]: [id number]

## 2020-01-13 DIAGNOSIS — I700 Atherosclerosis of aorta: Secondary | ICD-10-CM | POA: Diagnosis not present

## 2020-01-13 DIAGNOSIS — C61 Malignant neoplasm of prostate: Secondary | ICD-10-CM | POA: Diagnosis not present

## 2020-01-13 DIAGNOSIS — N182 Chronic kidney disease, stage 2 (mild): Secondary | ICD-10-CM | POA: Diagnosis not present

## 2020-01-13 DIAGNOSIS — I2510 Atherosclerotic heart disease of native coronary artery without angina pectoris: Secondary | ICD-10-CM | POA: Diagnosis not present

## 2020-01-13 DIAGNOSIS — I429 Cardiomyopathy, unspecified: Secondary | ICD-10-CM | POA: Diagnosis not present

## 2020-01-13 DIAGNOSIS — I1310 Hypertensive heart and chronic kidney disease without heart failure, with stage 1 through stage 4 chronic kidney disease, or unspecified chronic kidney disease: Secondary | ICD-10-CM | POA: Diagnosis not present

## 2020-01-13 DIAGNOSIS — E1122 Type 2 diabetes mellitus with diabetic chronic kidney disease: Secondary | ICD-10-CM | POA: Diagnosis not present

## 2020-01-13 DIAGNOSIS — I878 Other specified disorders of veins: Secondary | ICD-10-CM | POA: Diagnosis not present

## 2020-01-13 DIAGNOSIS — G5601 Carpal tunnel syndrome, right upper limb: Secondary | ICD-10-CM | POA: Diagnosis not present

## 2020-01-13 DIAGNOSIS — C649 Malignant neoplasm of unspecified kidney, except renal pelvis: Secondary | ICD-10-CM | POA: Diagnosis not present

## 2020-01-14 DIAGNOSIS — E1122 Type 2 diabetes mellitus with diabetic chronic kidney disease: Secondary | ICD-10-CM | POA: Diagnosis not present

## 2020-01-14 DIAGNOSIS — E7849 Other hyperlipidemia: Secondary | ICD-10-CM | POA: Diagnosis not present

## 2020-01-27 ENCOUNTER — Ambulatory Visit: Payer: Medicare Other | Attending: Internal Medicine

## 2020-01-27 DIAGNOSIS — Z23 Encounter for immunization: Secondary | ICD-10-CM

## 2020-01-27 NOTE — Progress Notes (Signed)
   Covid-19 Vaccination Clinic  Name:  UZIEL CAPLETTE    MRN: [id number] DOB: 1946/08/07  01/27/2020  Mr. Haliburton was observed post Covid-19 immunization for 15 minutes without incident. He was provided with Vaccine Information Sheet and instruction to access the V-Safe system.   Mr. Pippins was instructed to call 911 with any severe reactions post vaccine: Marland Kitchen Difficulty breathing  . Swelling of face and throat  . A fast heartbeat  . A bad rash all over body  . Dizziness and weakness   Immunizations Administered    Name Date Dose VIS Date Route   Pfizer COVID-19 Vaccine 01/27/2020 11:06 AM 0.3 mL 11/01/2019 Intramuscular   Manufacturer: [place]   Lot: [id number]   [place]: [id number]

## 2020-07-06 DIAGNOSIS — E1122 Type 2 diabetes mellitus with diabetic chronic kidney disease: Secondary | ICD-10-CM | POA: Diagnosis not present

## 2020-07-06 DIAGNOSIS — M109 Gout, unspecified: Secondary | ICD-10-CM | POA: Diagnosis not present

## 2020-07-06 DIAGNOSIS — E7849 Other hyperlipidemia: Secondary | ICD-10-CM | POA: Diagnosis not present

## 2020-07-09 ENCOUNTER — Ambulatory Visit (HOSPITAL_COMMUNITY)
Admission: RE | Admit: 2020-07-09 | Discharge: 2020-07-09 | Disposition: A | Discharge status: Home / Self Care | Payer: Medicare Other | Source: Ambulatory Visit | Attending: Urology | Admitting: Urology

## 2020-07-09 ENCOUNTER — Other Ambulatory Visit (HOSPITAL_COMMUNITY): Payer: Self-pay | Admitting: Urology

## 2020-07-09 ENCOUNTER — Other Ambulatory Visit: Payer: Self-pay

## 2020-07-09 DIAGNOSIS — C642 Malignant neoplasm of left kidney, except renal pelvis: Secondary | ICD-10-CM

## 2020-07-09 DIAGNOSIS — S2243XA Multiple fractures of ribs, bilateral, initial encounter for closed fracture: Secondary | ICD-10-CM | POA: Diagnosis not present

## 2020-07-09 DIAGNOSIS — S2249XA Multiple fractures of ribs, unspecified side, initial encounter for closed fracture: Secondary | ICD-10-CM | POA: Diagnosis not present

## 2020-07-09 DIAGNOSIS — J929 Pleural plaque without asbestos: Secondary | ICD-10-CM | POA: Diagnosis not present

## 2020-07-09 IMAGING — CR DG CHEST 2V
2 series · 2 of 2 positions shown · non-contrast
Comparison: [KV]

CLINICAL DATA: Left renal cell carcinoma

EXAM:
CHEST - 2 VIEW

[w chest pa]
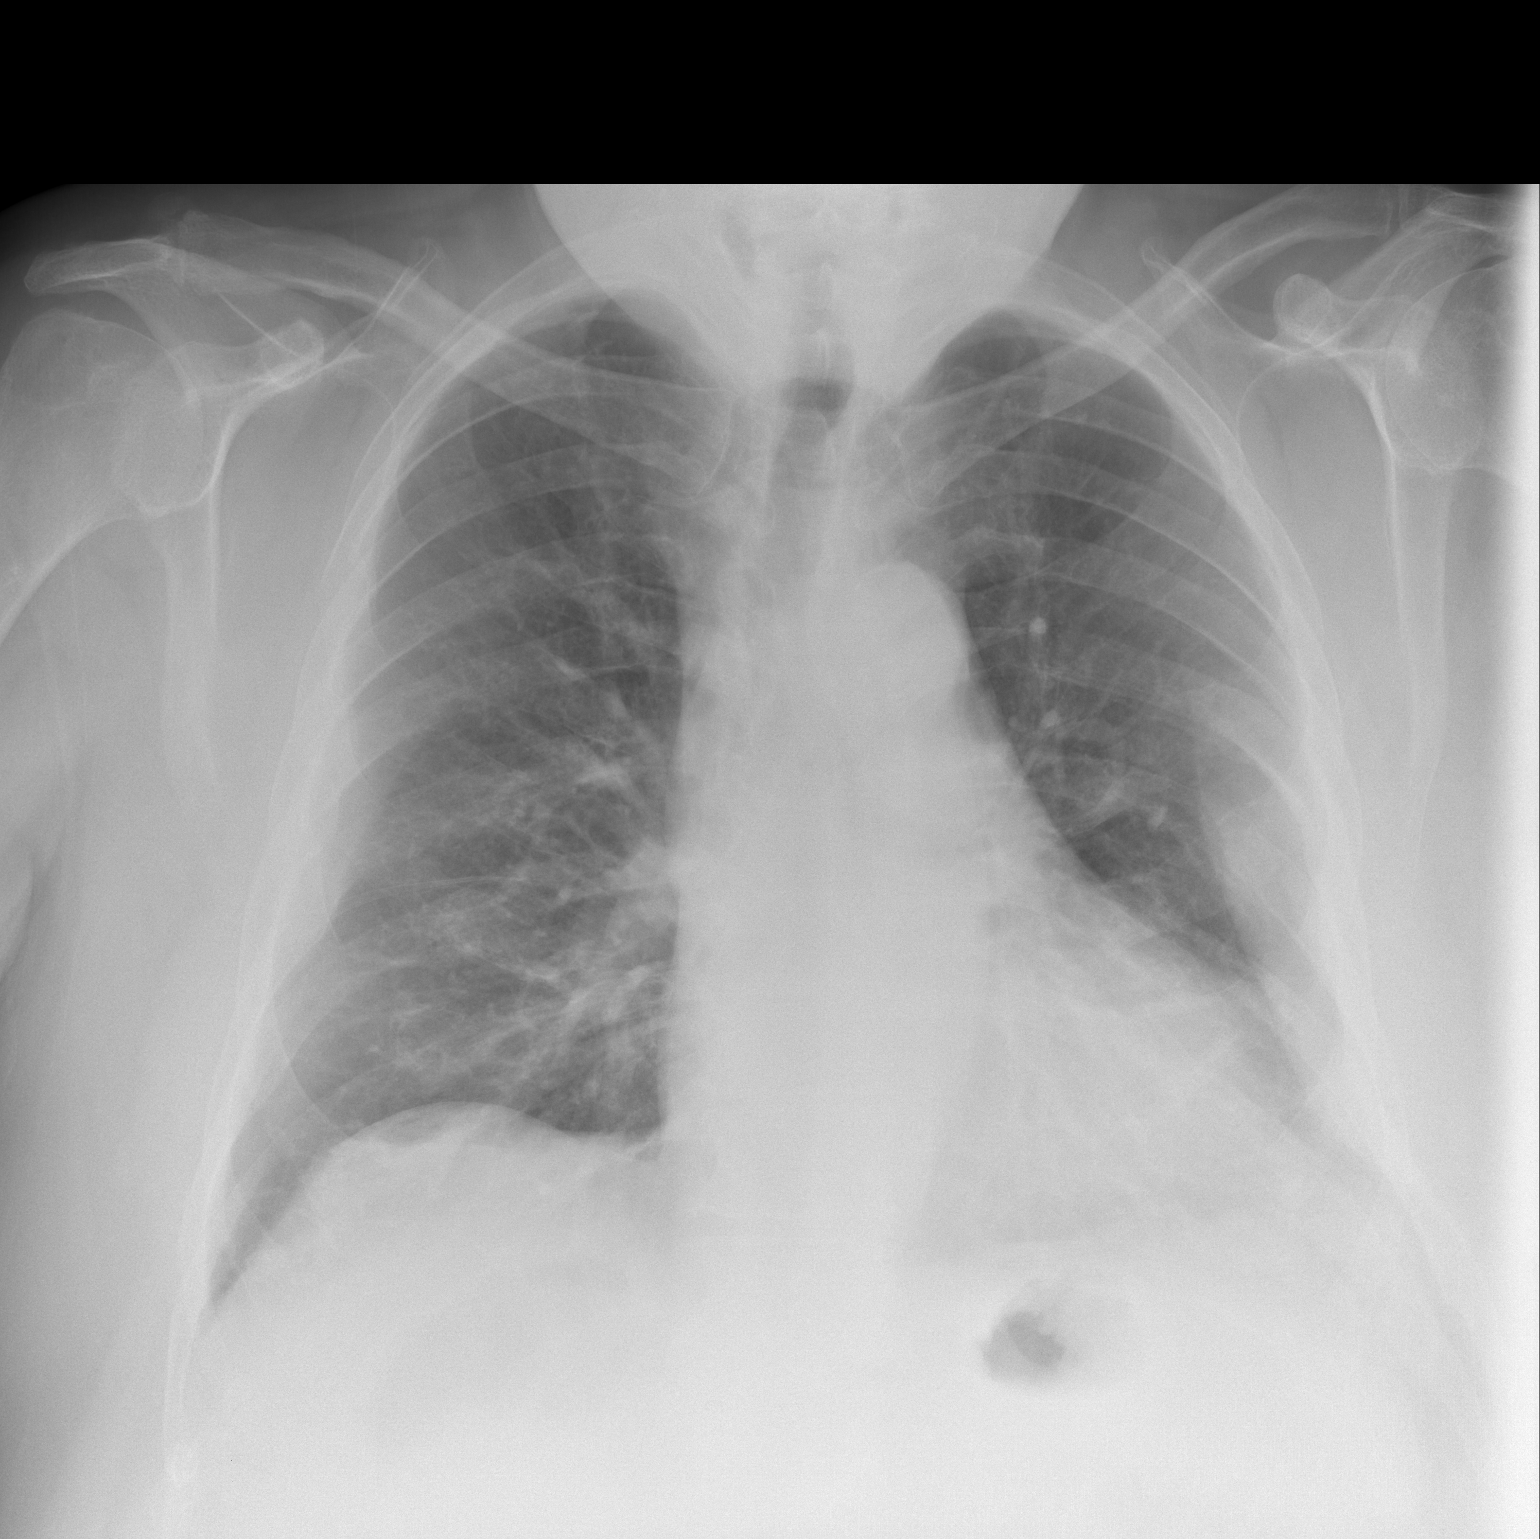

[w chest lat]
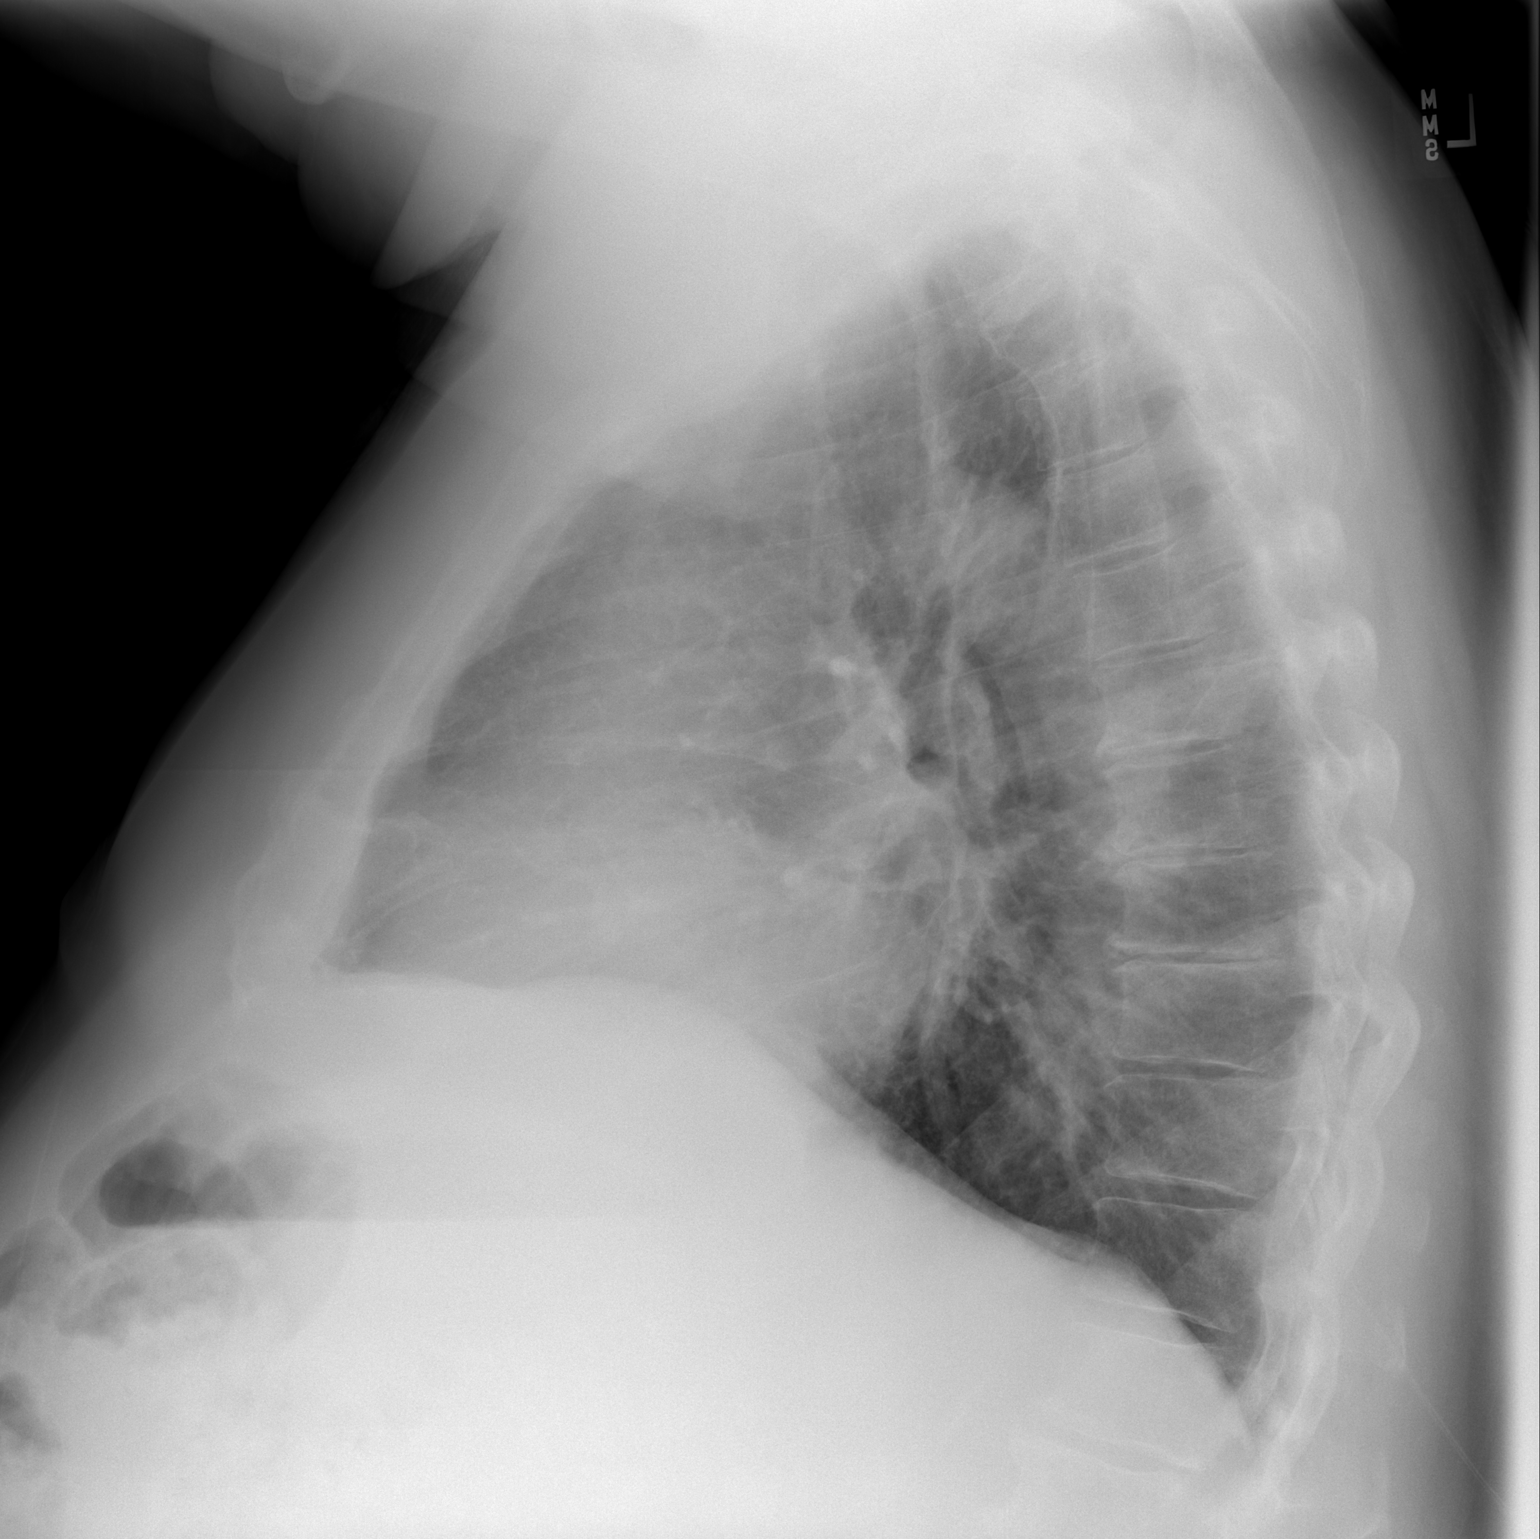

[2 of 2 positions shown; findings below may reference images not displayed]

FINDINGS: Stable bilateral pleural thickening with chronic rib fractures
noted. No new consolidation. No pleural effusion. Stable
cardiomediastinal contours. No acute osseous abnormality.
IMPRESSION: No acute process in the chest.

## 2020-07-13 DIAGNOSIS — C649 Malignant neoplasm of unspecified kidney, except renal pelvis: Secondary | ICD-10-CM | POA: Diagnosis not present

## 2020-07-13 DIAGNOSIS — I2510 Atherosclerotic heart disease of native coronary artery without angina pectoris: Secondary | ICD-10-CM | POA: Diagnosis not present

## 2020-07-13 DIAGNOSIS — Z0000 Encounter for general adult medical examination without abnormal findings: Secondary | ICD-10-CM | POA: Diagnosis not present

## 2020-07-13 DIAGNOSIS — E1122 Type 2 diabetes mellitus with diabetic chronic kidney disease: Secondary | ICD-10-CM | POA: Diagnosis not present

## 2020-07-13 DIAGNOSIS — I429 Cardiomyopathy, unspecified: Secondary | ICD-10-CM | POA: Diagnosis not present

## 2020-07-13 DIAGNOSIS — Z1212 Encounter for screening for malignant neoplasm of rectum: Secondary | ICD-10-CM | POA: Diagnosis not present

## 2020-07-13 DIAGNOSIS — R809 Proteinuria, unspecified: Secondary | ICD-10-CM | POA: Diagnosis not present

## 2020-07-13 DIAGNOSIS — E785 Hyperlipidemia, unspecified: Secondary | ICD-10-CM | POA: Diagnosis not present

## 2020-07-13 DIAGNOSIS — N182 Chronic kidney disease, stage 2 (mild): Secondary | ICD-10-CM | POA: Diagnosis not present

## 2020-07-13 DIAGNOSIS — K760 Fatty (change of) liver, not elsewhere classified: Secondary | ICD-10-CM | POA: Diagnosis not present

## 2020-07-13 DIAGNOSIS — I130 Hypertensive heart and chronic kidney disease with heart failure and stage 1 through stage 4 chronic kidney disease, or unspecified chronic kidney disease: Secondary | ICD-10-CM | POA: Diagnosis not present

## 2020-07-13 DIAGNOSIS — C61 Malignant neoplasm of prostate: Secondary | ICD-10-CM | POA: Diagnosis not present

## 2020-07-14 DIAGNOSIS — C649 Malignant neoplasm of unspecified kidney, except renal pelvis: Secondary | ICD-10-CM | POA: Diagnosis not present

## 2020-07-14 DIAGNOSIS — N200 Calculus of kidney: Secondary | ICD-10-CM | POA: Diagnosis not present

## 2020-07-14 DIAGNOSIS — I700 Atherosclerosis of aorta: Secondary | ICD-10-CM | POA: Diagnosis not present

## 2020-07-14 DIAGNOSIS — Z85528 Personal history of other malignant neoplasm of kidney: Secondary | ICD-10-CM | POA: Diagnosis not present

## 2020-07-14 DIAGNOSIS — K5730 Diverticulosis of large intestine without perforation or abscess without bleeding: Secondary | ICD-10-CM | POA: Diagnosis not present

## 2020-07-17 DIAGNOSIS — Z85528 Personal history of other malignant neoplasm of kidney: Secondary | ICD-10-CM | POA: Diagnosis not present

## 2020-07-17 DIAGNOSIS — R3915 Urgency of urination: Secondary | ICD-10-CM | POA: Diagnosis not present

## 2020-07-17 DIAGNOSIS — N3281 Overactive bladder: Secondary | ICD-10-CM | POA: Diagnosis not present

## 2020-07-17 DIAGNOSIS — Z8546 Personal history of malignant neoplasm of prostate: Secondary | ICD-10-CM | POA: Diagnosis not present

## 2020-07-17 DIAGNOSIS — N35010 Post-traumatic urethral stricture, male, meatal: Secondary | ICD-10-CM | POA: Diagnosis not present

## 2020-08-22 DIAGNOSIS — Z23 Encounter for immunization: Secondary | ICD-10-CM | POA: Diagnosis not present

## 2020-08-26 DIAGNOSIS — Z23 Encounter for immunization: Secondary | ICD-10-CM | POA: Diagnosis not present

## 2020-11-18 DIAGNOSIS — Z85828 Personal history of other malignant neoplasm of skin: Secondary | ICD-10-CM | POA: Diagnosis not present

## 2020-11-18 DIAGNOSIS — L570 Actinic keratosis: Secondary | ICD-10-CM | POA: Diagnosis not present

## 2020-11-18 DIAGNOSIS — D1801 Hemangioma of skin and subcutaneous tissue: Secondary | ICD-10-CM | POA: Diagnosis not present

## 2020-11-18 DIAGNOSIS — L821 Other seborrheic keratosis: Secondary | ICD-10-CM | POA: Diagnosis not present

## 2021-01-18 DIAGNOSIS — N182 Chronic kidney disease, stage 2 (mild): Secondary | ICD-10-CM | POA: Diagnosis not present

## 2021-01-18 DIAGNOSIS — R918 Other nonspecific abnormal finding of lung field: Secondary | ICD-10-CM | POA: Diagnosis not present

## 2021-01-18 DIAGNOSIS — I2510 Atherosclerotic heart disease of native coronary artery without angina pectoris: Secondary | ICD-10-CM | POA: Diagnosis not present

## 2021-01-18 DIAGNOSIS — E785 Hyperlipidemia, unspecified: Secondary | ICD-10-CM | POA: Diagnosis not present

## 2021-01-18 DIAGNOSIS — C649 Malignant neoplasm of unspecified kidney, except renal pelvis: Secondary | ICD-10-CM | POA: Diagnosis not present

## 2021-01-18 DIAGNOSIS — E1122 Type 2 diabetes mellitus with diabetic chronic kidney disease: Secondary | ICD-10-CM | POA: Diagnosis not present

## 2021-01-18 DIAGNOSIS — I130 Hypertensive heart and chronic kidney disease with heart failure and stage 1 through stage 4 chronic kidney disease, or unspecified chronic kidney disease: Secondary | ICD-10-CM | POA: Diagnosis not present

## 2021-01-18 DIAGNOSIS — I429 Cardiomyopathy, unspecified: Secondary | ICD-10-CM | POA: Diagnosis not present

## 2021-01-18 DIAGNOSIS — I700 Atherosclerosis of aorta: Secondary | ICD-10-CM | POA: Diagnosis not present

## 2021-01-18 DIAGNOSIS — K760 Fatty (change of) liver, not elsewhere classified: Secondary | ICD-10-CM | POA: Diagnosis not present

## 2021-01-18 DIAGNOSIS — C61 Malignant neoplasm of prostate: Secondary | ICD-10-CM | POA: Diagnosis not present

## 2021-04-16 DIAGNOSIS — M170 Bilateral primary osteoarthritis of knee: Secondary | ICD-10-CM | POA: Diagnosis not present

## 2021-06-16 DIAGNOSIS — H25043 Posterior subcapsular polar age-related cataract, bilateral: Secondary | ICD-10-CM | POA: Diagnosis not present

## 2021-06-16 DIAGNOSIS — H5203 Hypermetropia, bilateral: Secondary | ICD-10-CM | POA: Diagnosis not present

## 2021-06-16 DIAGNOSIS — H2513 Age-related nuclear cataract, bilateral: Secondary | ICD-10-CM | POA: Diagnosis not present

## 2021-06-16 DIAGNOSIS — H524 Presbyopia: Secondary | ICD-10-CM | POA: Diagnosis not present

## 2021-07-12 DIAGNOSIS — C61 Malignant neoplasm of prostate: Secondary | ICD-10-CM | POA: Diagnosis not present

## 2021-07-15 DIAGNOSIS — I130 Hypertensive heart and chronic kidney disease with heart failure and stage 1 through stage 4 chronic kidney disease, or unspecified chronic kidney disease: Secondary | ICD-10-CM | POA: Diagnosis not present

## 2021-07-15 DIAGNOSIS — E1122 Type 2 diabetes mellitus with diabetic chronic kidney disease: Secondary | ICD-10-CM | POA: Diagnosis not present

## 2021-07-15 DIAGNOSIS — E785 Hyperlipidemia, unspecified: Secondary | ICD-10-CM | POA: Diagnosis not present

## 2021-07-15 DIAGNOSIS — M109 Gout, unspecified: Secondary | ICD-10-CM | POA: Diagnosis not present

## 2021-07-16 DIAGNOSIS — C61 Malignant neoplasm of prostate: Secondary | ICD-10-CM | POA: Diagnosis not present

## 2021-07-16 DIAGNOSIS — N200 Calculus of kidney: Secondary | ICD-10-CM | POA: Diagnosis not present

## 2021-07-16 DIAGNOSIS — N281 Cyst of kidney, acquired: Secondary | ICD-10-CM | POA: Diagnosis not present

## 2021-07-16 DIAGNOSIS — Z85528 Personal history of other malignant neoplasm of kidney: Secondary | ICD-10-CM | POA: Diagnosis not present

## 2021-07-19 DIAGNOSIS — C642 Malignant neoplasm of left kidney, except renal pelvis: Secondary | ICD-10-CM | POA: Diagnosis not present

## 2021-07-19 DIAGNOSIS — N35010 Post-traumatic urethral stricture, male, meatal: Secondary | ICD-10-CM | POA: Diagnosis not present

## 2021-07-19 DIAGNOSIS — C61 Malignant neoplasm of prostate: Secondary | ICD-10-CM | POA: Diagnosis not present

## 2021-07-22 DIAGNOSIS — K760 Fatty (change of) liver, not elsewhere classified: Secondary | ICD-10-CM | POA: Diagnosis not present

## 2021-07-22 DIAGNOSIS — E785 Hyperlipidemia, unspecified: Secondary | ICD-10-CM | POA: Diagnosis not present

## 2021-07-22 DIAGNOSIS — I2510 Atherosclerotic heart disease of native coronary artery without angina pectoris: Secondary | ICD-10-CM | POA: Diagnosis not present

## 2021-07-22 DIAGNOSIS — M109 Gout, unspecified: Secondary | ICD-10-CM | POA: Diagnosis not present

## 2021-07-22 DIAGNOSIS — N182 Chronic kidney disease, stage 2 (mild): Secondary | ICD-10-CM | POA: Diagnosis not present

## 2021-07-22 DIAGNOSIS — E1122 Type 2 diabetes mellitus with diabetic chronic kidney disease: Secondary | ICD-10-CM | POA: Diagnosis not present

## 2021-07-22 DIAGNOSIS — Z1212 Encounter for screening for malignant neoplasm of rectum: Secondary | ICD-10-CM | POA: Diagnosis not present

## 2021-07-22 DIAGNOSIS — Z0000 Encounter for general adult medical examination without abnormal findings: Secondary | ICD-10-CM | POA: Diagnosis not present

## 2021-07-22 DIAGNOSIS — I130 Hypertensive heart and chronic kidney disease with heart failure and stage 1 through stage 4 chronic kidney disease, or unspecified chronic kidney disease: Secondary | ICD-10-CM | POA: Diagnosis not present

## 2021-07-22 DIAGNOSIS — Z1389 Encounter for screening for other disorder: Secondary | ICD-10-CM | POA: Diagnosis not present

## 2021-07-22 DIAGNOSIS — I700 Atherosclerosis of aorta: Secondary | ICD-10-CM | POA: Diagnosis not present

## 2021-07-22 DIAGNOSIS — C61 Malignant neoplasm of prostate: Secondary | ICD-10-CM | POA: Diagnosis not present

## 2021-07-22 DIAGNOSIS — Z1331 Encounter for screening for depression: Secondary | ICD-10-CM | POA: Diagnosis not present

## 2021-07-22 DIAGNOSIS — Z23 Encounter for immunization: Secondary | ICD-10-CM | POA: Diagnosis not present

## 2021-07-22 DIAGNOSIS — R809 Proteinuria, unspecified: Secondary | ICD-10-CM | POA: Diagnosis not present

## 2021-09-13 ENCOUNTER — Encounter: Payer: Self-pay | Admitting: Gastroenterology

## 2021-09-22 ENCOUNTER — Emergency Department (HOSPITAL_COMMUNITY): Payer: Medicare Other

## 2021-09-22 ENCOUNTER — Telehealth: Payer: Self-pay | Admitting: Internal Medicine

## 2021-09-22 ENCOUNTER — Other Ambulatory Visit: Payer: Self-pay | Admitting: Neurosurgery

## 2021-09-22 ENCOUNTER — Other Ambulatory Visit: Payer: Self-pay

## 2021-09-22 ENCOUNTER — Inpatient Hospital Stay (HOSPITAL_COMMUNITY)
Admission: EM | Admit: 2021-09-22 | Discharge: 2021-09-28 | DRG: 520 | Disposition: A | Discharge status: Skilled Nursing Facility | Payer: Medicare Other | Attending: Neurosurgery | Admitting: Neurosurgery

## 2021-09-22 ENCOUNTER — Encounter (HOSPITAL_COMMUNITY): Payer: Self-pay

## 2021-09-22 DIAGNOSIS — M5116 Intervertebral disc disorders with radiculopathy, lumbar region: Secondary | ICD-10-CM | POA: Diagnosis not present

## 2021-09-22 DIAGNOSIS — R609 Edema, unspecified: Secondary | ICD-10-CM | POA: Diagnosis not present

## 2021-09-22 DIAGNOSIS — Z8546 Personal history of malignant neoplasm of prostate: Secondary | ICD-10-CM

## 2021-09-22 DIAGNOSIS — Z888 Allergy status to other drugs, medicaments and biological substances status: Secondary | ICD-10-CM | POA: Diagnosis not present

## 2021-09-22 DIAGNOSIS — R29898 Other symptoms and signs involving the musculoskeletal system: Secondary | ICD-10-CM

## 2021-09-22 DIAGNOSIS — I10 Essential (primary) hypertension: Secondary | ICD-10-CM | POA: Diagnosis not present

## 2021-09-22 DIAGNOSIS — Z79899 Other long term (current) drug therapy: Secondary | ICD-10-CM | POA: Diagnosis not present

## 2021-09-22 DIAGNOSIS — M5126 Other intervertebral disc displacement, lumbar region: Secondary | ICD-10-CM | POA: Diagnosis not present

## 2021-09-22 DIAGNOSIS — R262 Difficulty in walking, not elsewhere classified: Secondary | ICD-10-CM | POA: Diagnosis not present

## 2021-09-22 DIAGNOSIS — Z751 Person awaiting admission to adequate facility elsewhere: Secondary | ICD-10-CM | POA: Diagnosis not present

## 2021-09-22 DIAGNOSIS — Z20822 Contact with and (suspected) exposure to covid-19: Secondary | ICD-10-CM | POA: Diagnosis not present

## 2021-09-22 DIAGNOSIS — M48061 Spinal stenosis, lumbar region without neurogenic claudication: Secondary | ICD-10-CM | POA: Diagnosis not present

## 2021-09-22 DIAGNOSIS — Z87820 Personal history of traumatic brain injury: Secondary | ICD-10-CM

## 2021-09-22 DIAGNOSIS — Z923 Personal history of irradiation: Secondary | ICD-10-CM | POA: Diagnosis not present

## 2021-09-22 DIAGNOSIS — R7303 Prediabetes: Secondary | ICD-10-CM | POA: Diagnosis not present

## 2021-09-22 DIAGNOSIS — Z419 Encounter for procedure for purposes other than remedying health state, unspecified: Secondary | ICD-10-CM

## 2021-09-22 DIAGNOSIS — I2510 Atherosclerotic heart disease of native coronary artery without angina pectoris: Secondary | ICD-10-CM | POA: Diagnosis not present

## 2021-09-22 DIAGNOSIS — E669 Obesity, unspecified: Secondary | ICD-10-CM | POA: Diagnosis not present

## 2021-09-22 DIAGNOSIS — Z833 Family history of diabetes mellitus: Secondary | ICD-10-CM

## 2021-09-22 DIAGNOSIS — Z7982 Long term (current) use of aspirin: Secondary | ICD-10-CM | POA: Diagnosis not present

## 2021-09-22 DIAGNOSIS — Z885 Allergy status to narcotic agent status: Secondary | ICD-10-CM | POA: Diagnosis not present

## 2021-09-22 DIAGNOSIS — G8929 Other chronic pain: Secondary | ICD-10-CM | POA: Diagnosis not present

## 2021-09-22 DIAGNOSIS — Z87440 Personal history of urinary (tract) infections: Secondary | ICD-10-CM | POA: Diagnosis not present

## 2021-09-22 DIAGNOSIS — Z6836 Body mass index (BMI) 36.0-36.9, adult: Secondary | ICD-10-CM | POA: Diagnosis not present

## 2021-09-22 DIAGNOSIS — M1990 Unspecified osteoarthritis, unspecified site: Secondary | ICD-10-CM | POA: Diagnosis present

## 2021-09-22 DIAGNOSIS — Z800 Family history of malignant neoplasm of digestive organs: Secondary | ICD-10-CM

## 2021-09-22 DIAGNOSIS — R531 Weakness: Secondary | ICD-10-CM | POA: Diagnosis not present

## 2021-09-22 DIAGNOSIS — M25552 Pain in left hip: Secondary | ICD-10-CM | POA: Diagnosis not present

## 2021-09-22 LAB — RESP PANEL BY RT-PCR (FLU A&B, COVID) ARPGX2
Influenza A by PCR: NEGATIVE
Influenza B by PCR: NEGATIVE
SARS Coronavirus 2 by RT PCR: NEGATIVE

## 2021-09-22 LAB — CBC WITH DIFFERENTIAL/PLATELET
Abs Immature Granulocytes: 0.05 10*3/uL (ref 0.00–0.07)
Basophils Absolute: 0.1 10*3/uL (ref 0.0–0.1)
Basophils Relative: 1 %
Eosinophils Absolute: 0.1 10*3/uL (ref 0.0–0.5)
Eosinophils Relative: 1 %
HCT: 46.1 % (ref 39.0–52.0)
Hemoglobin: 16.3 g/dL (ref 13.0–17.0)
Immature Granulocytes: 0 %
Lymphocytes Relative: 11 %
Lymphs Abs: 1.3 10*3/uL (ref 0.7–4.0)
MCH: 32.7 pg (ref 26.0–34.0)
MCHC: 35.4 g/dL (ref 30.0–36.0)
MCV: 92.6 fL (ref 80.0–100.0)
Monocytes Absolute: 1.3 10*3/uL — ABNORMAL HIGH (ref 0.1–1.0)
Monocytes Relative: 11 %
Neutro Abs: 8.9 10*3/uL — ABNORMAL HIGH (ref 1.7–7.7)
Neutrophils Relative %: 76 %
Platelets: 166 10*3/uL (ref 150–400)
RBC: 4.98 MIL/uL (ref 4.22–5.81)
RDW: 13 % (ref 11.5–15.5)
WBC: 11.7 10*3/uL — ABNORMAL HIGH (ref 4.0–10.5)
nRBC: 0 % (ref 0.0–0.2)

## 2021-09-22 LAB — COMPREHENSIVE METABOLIC PANEL
ALT: 42 U/L (ref 0–44)
AST: 38 U/L (ref 15–41)
Albumin: 3.9 g/dL (ref 3.5–5.0)
Alkaline Phosphatase: 76 U/L (ref 38–126)
Anion gap: 11 (ref 5–15)
BUN: 23 mg/dL (ref 8–23)
CO2: 21 mmol/L — ABNORMAL LOW (ref 22–32)
Calcium: 8.9 mg/dL (ref 8.9–10.3)
Chloride: 107 mmol/L (ref 98–111)
Creatinine, Ser: 1.15 mg/dL (ref 0.61–1.24)
GFR, Estimated: >60 mL/min (ref >60)
Glucose, Bld: 158 mg/dL — ABNORMAL HIGH (ref 70–99)
Potassium: 2.9 mmol/L — ABNORMAL LOW (ref 3.5–5.1)
Sodium: 139 mmol/L (ref 135–145)
Total Bilirubin: 1.3 mg/dL — ABNORMAL HIGH (ref 0.3–1.2)
Total Protein: 6.8 g/dL (ref 6.5–8.1)

## 2021-09-22 IMAGING — CR DG HIP (WITH OR WITHOUT PELVIS) 2-3V*L*
3 series · 3 of 3 positions shown · non-contrast
Comparison: None.

CLINICAL DATA: Pain, difficulty walking

EXAM:
DG HIP (WITH OR WITHOUT PELVIS) 2-3V LEFT

[x pelvis]
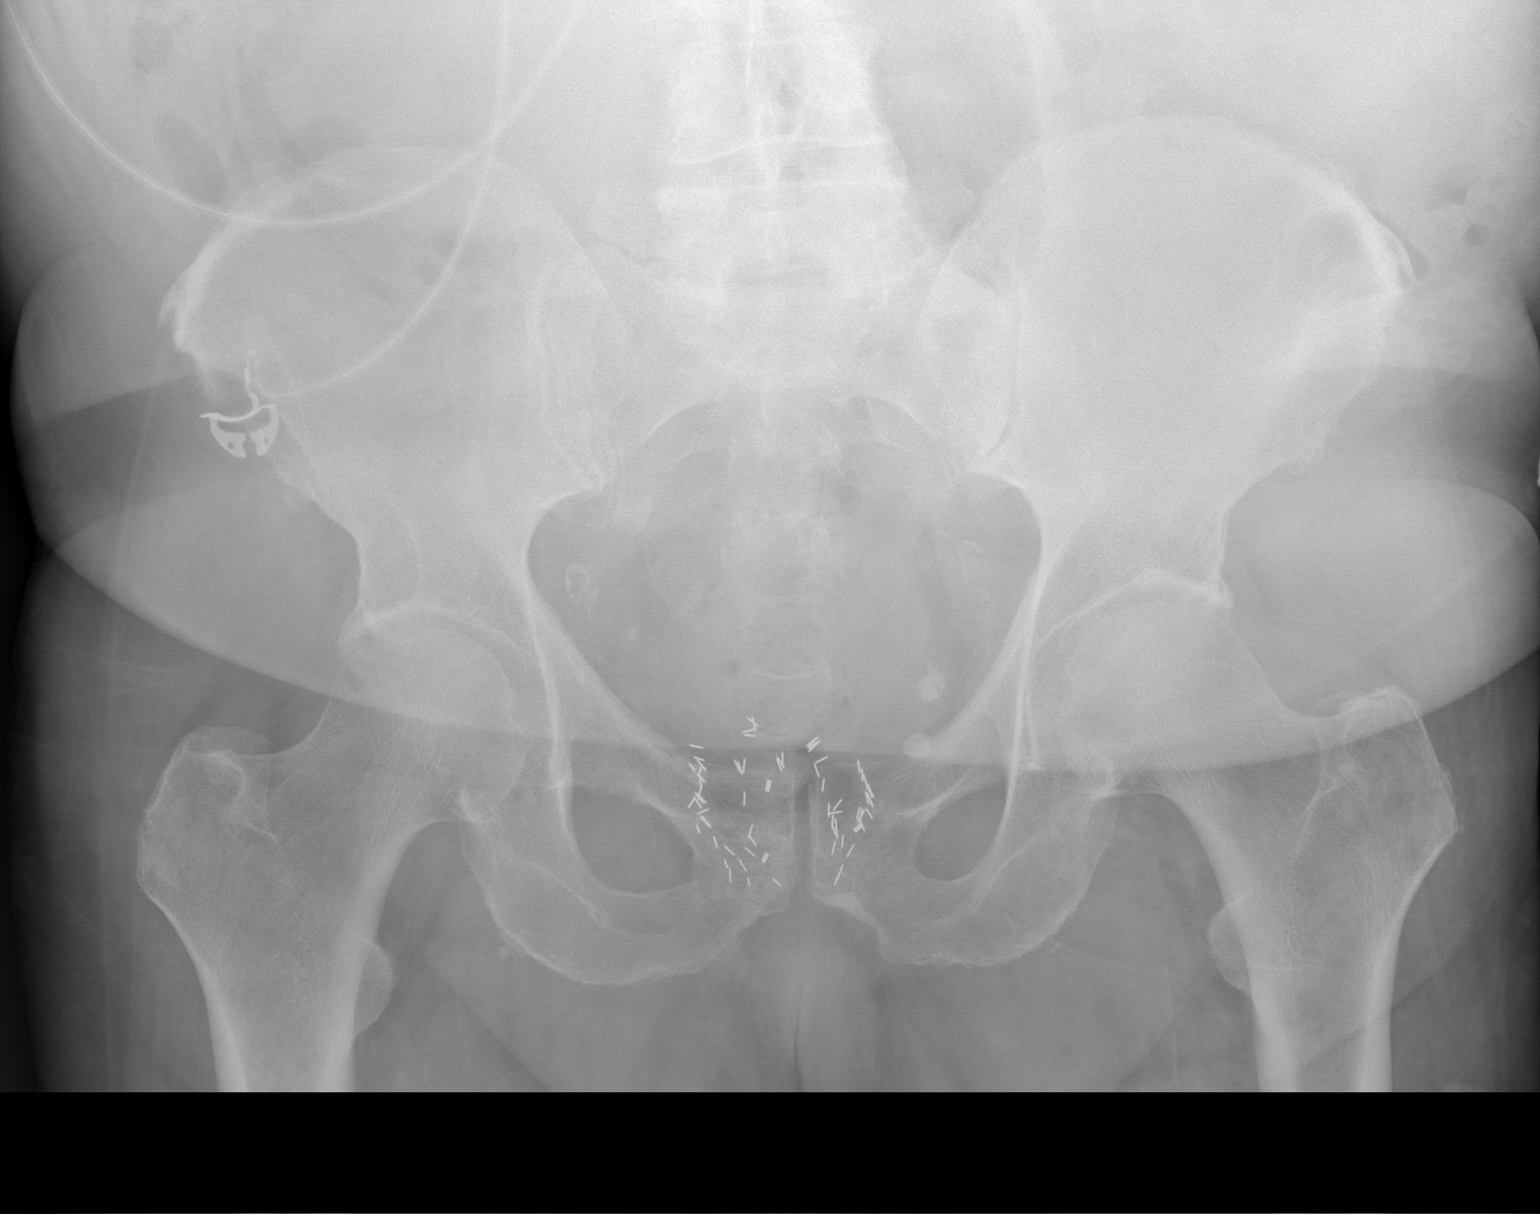

[x hip ap left]
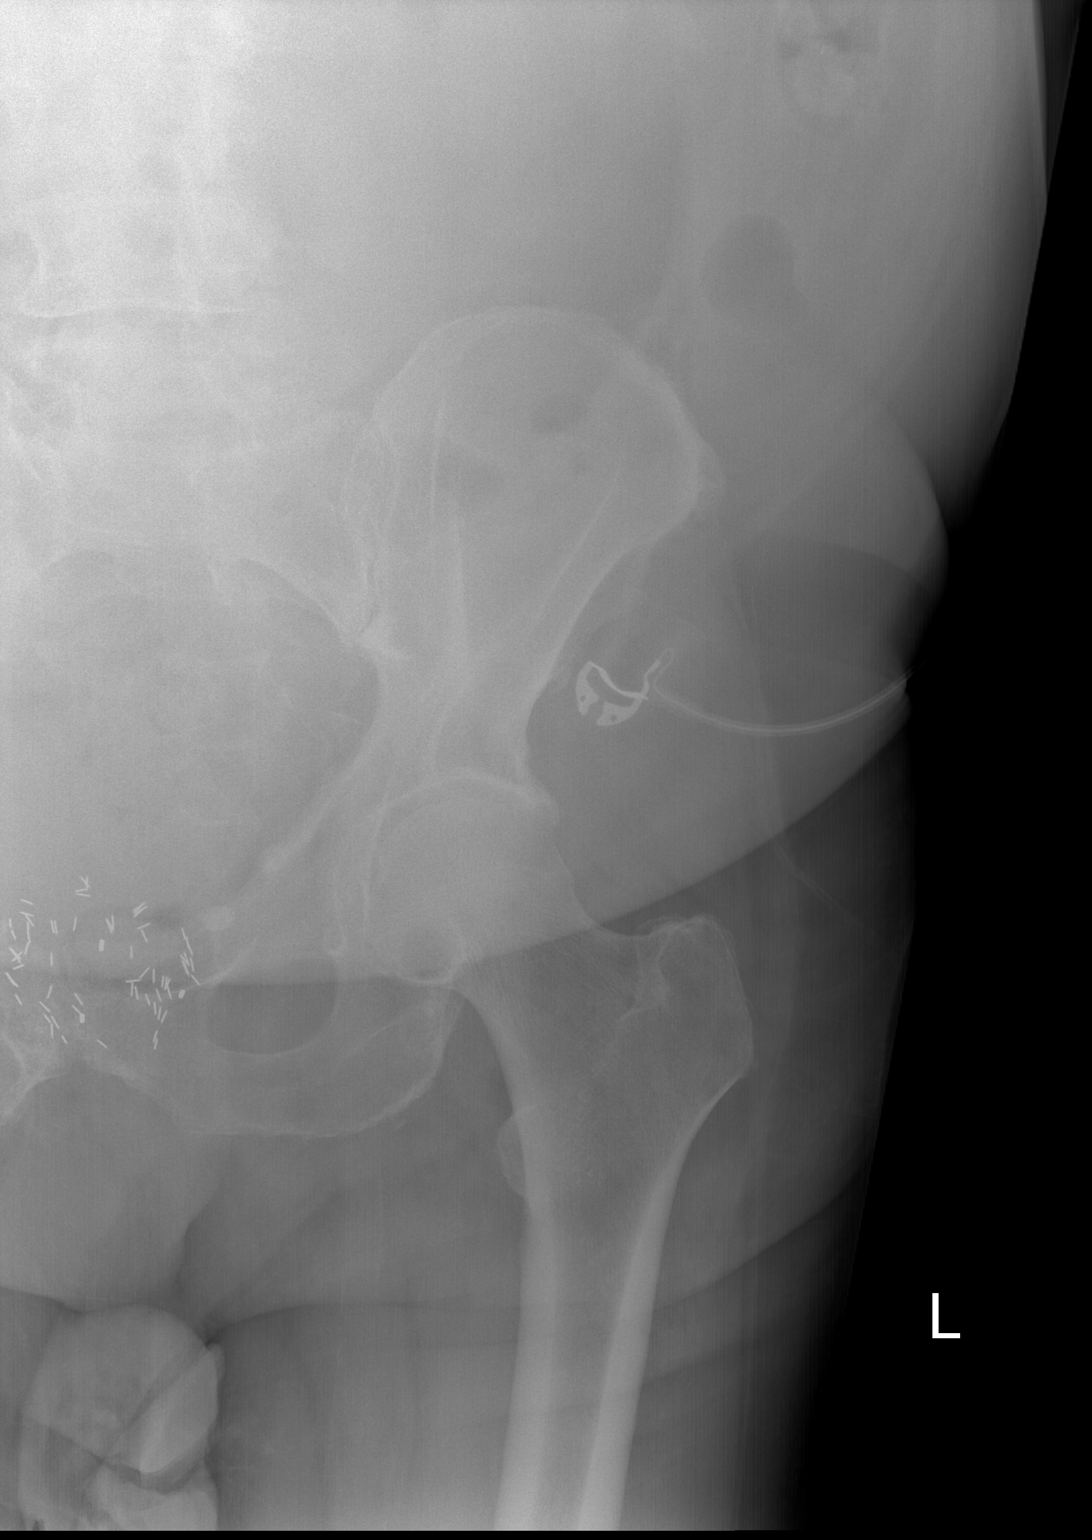

[x hip lat left]
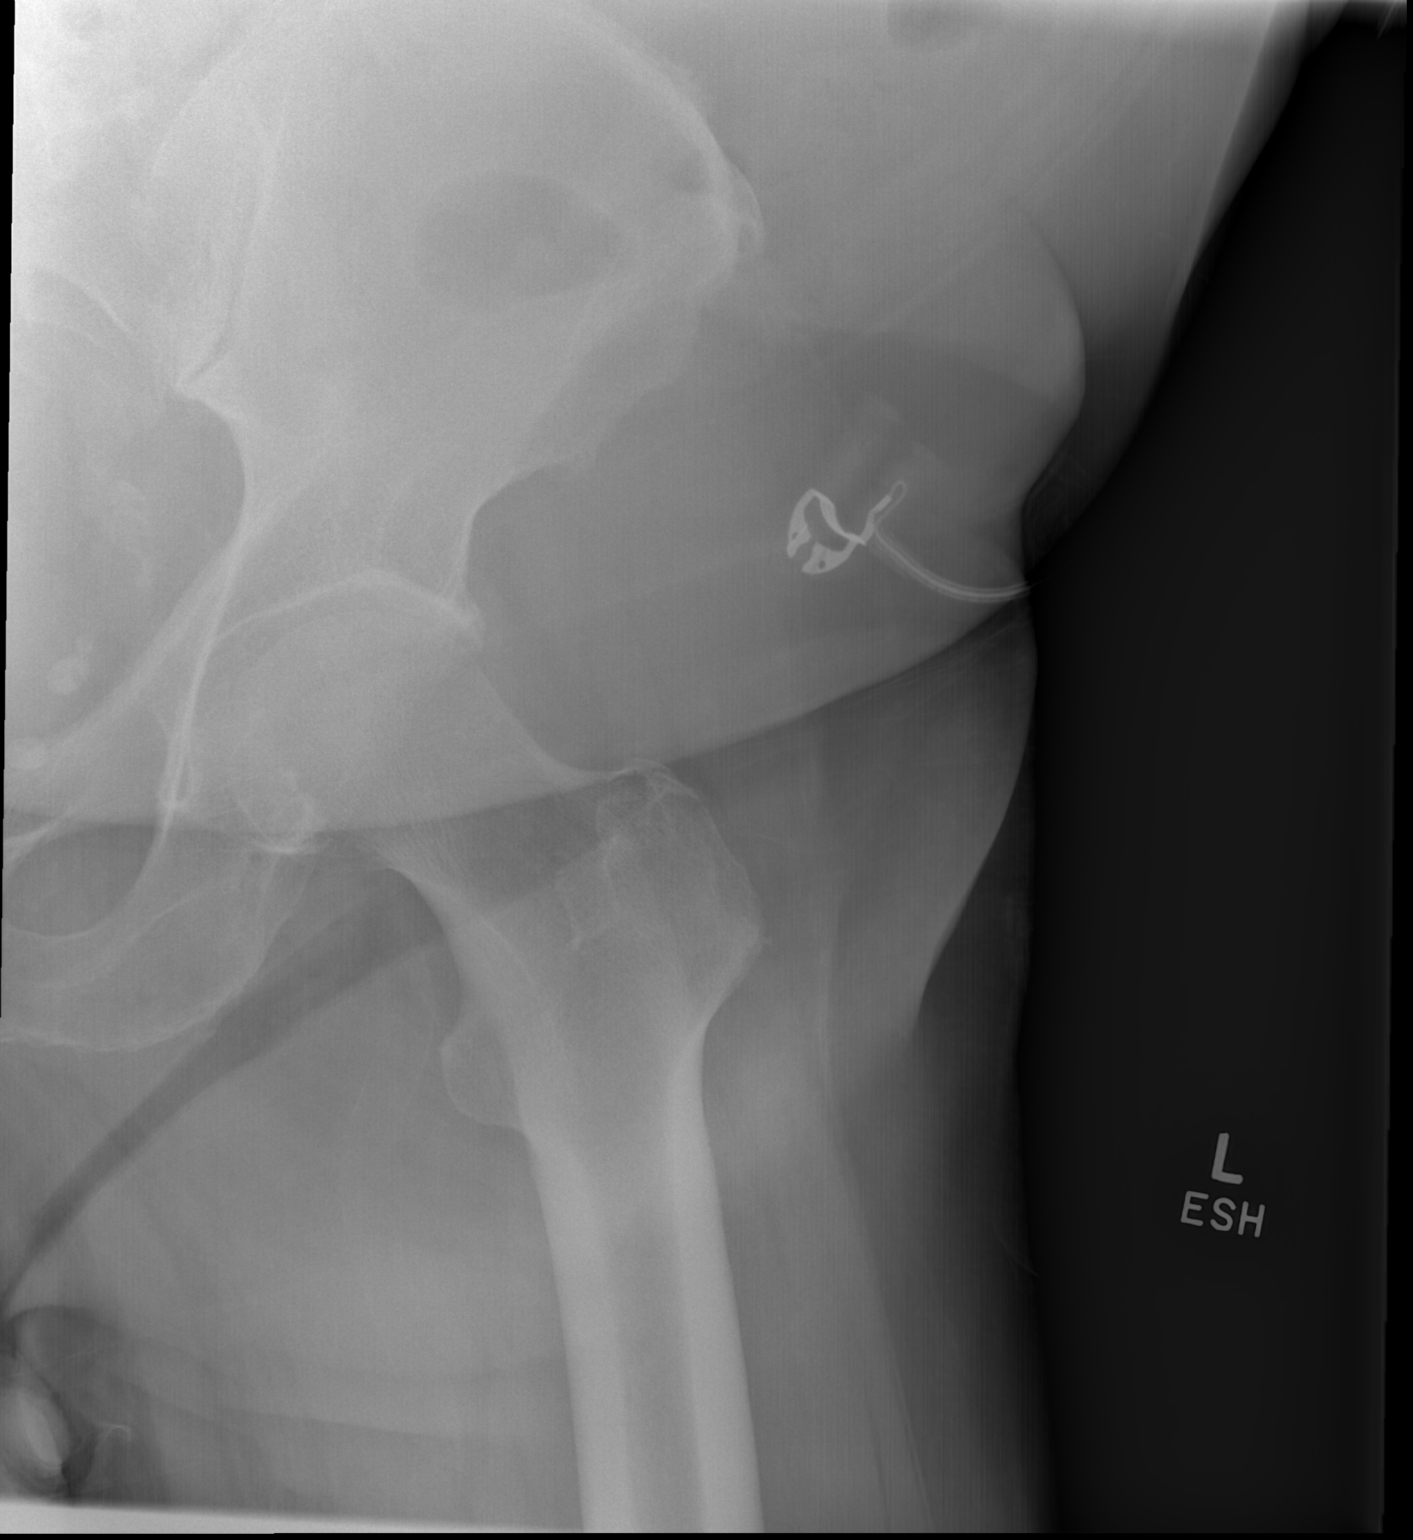

[3 of 3 positions shown; findings below may reference images not displayed]

FINDINGS: There is evidence of previous brachytherapy with numerous metallic
densities in the region of prostate. No recent fracture or
dislocation is seen. Small bony spurs seen in the lateral aspect of
left hip. There are no focal lytic or sclerotic lesions.
Degenerative changes are noted in the visualized lower lumbar spine.
IMPRESSION: No fracture or dislocation is seen. There are no focal lytic or
sclerotic lesions. Degenerative changes are noted with small bony
spurs in the lateral aspect of left hip. Degenerative changes are
noted in the visualized lower lumbar spine.

## 2021-09-22 IMAGING — MR MR LUMBAR SPINE WO/W CM
6 of 7 series · 30 of 48 positions shown · IV contrast (gadavist)
Comparison: CT abdomen [DATE]

CLINICAL DATA: Bone neoplasm suspected. History of prostate cancer.
Bilateral lower extremity weakness with difficulty walking.

EXAM:
MRI LUMBAR SPINE WITHOUT AND WITH CONTRAST
TECHNIQUE: Multiplanar and multiecho pulse sequences of the lumbar spine were
obtained without and with intravenous contrast.
CONTRAST:  10mL GADAVIST GADOBUTROL 1 MMOL/ML IV SOLN

[Series 5: T1 · sagittal · 4.0mm · 0.81mm/px · 5 of 17 slices shown (1 of 2)]
[im 1/17]
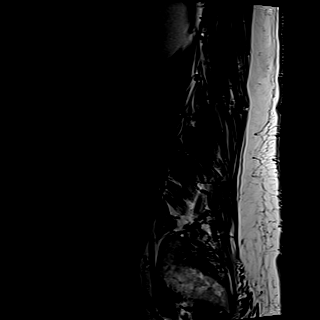
[im 5/17]
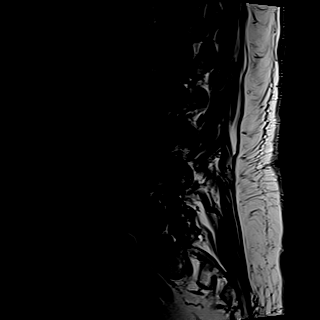
[im 9/17]
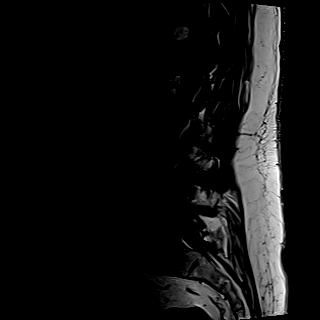
[im 13/17]
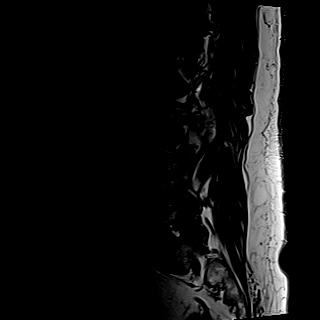
[im 17/17]
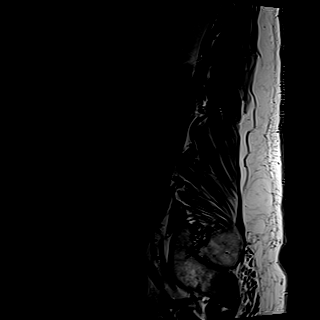

[Series 6: STIR · sagittal · 4.0mm · 0.51mm/px · 1 of 17 slices shown]
[im 1/17]
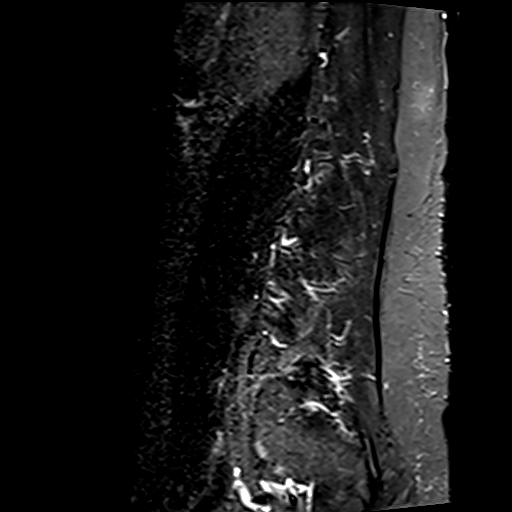

[Series 7: T2 · axial · 4.0mm · 0.62mm/px · z∈[-62,+142]mm · 8 of 41 slices shown]
[im 1/41]
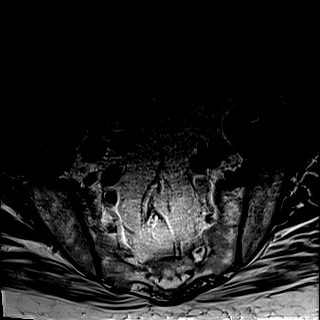
[im 5/41]
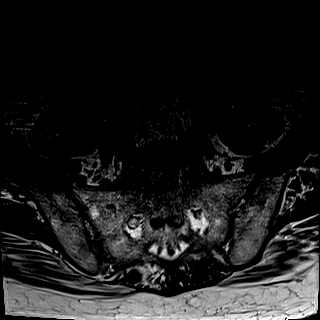
[im 14/41]
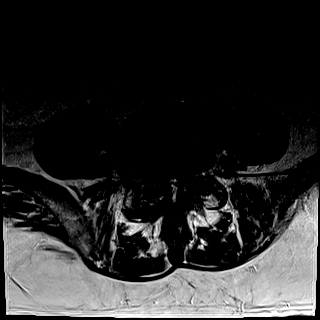
[im 18/41]
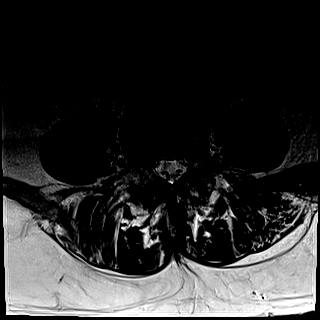
[im 23/41]
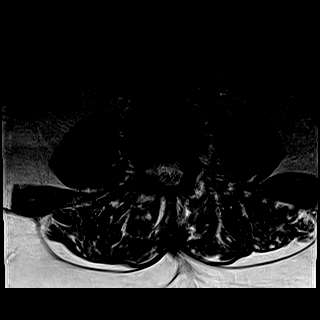
[im 27/41]
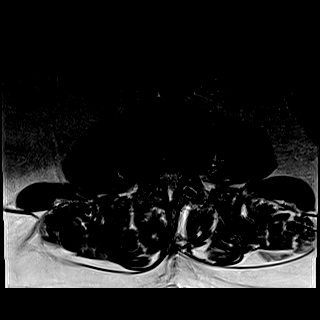
[im 36/41]
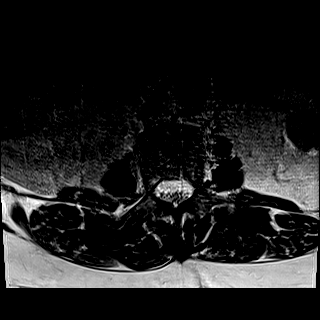
[im 41/41]
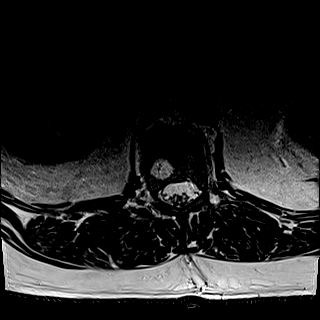

[Series 8: T1 · axial · 4.0mm · 0.39mm/px · z∈[-62,+142]mm · 8 of 42 slices shown (2 of 2)]
[im 1/42]
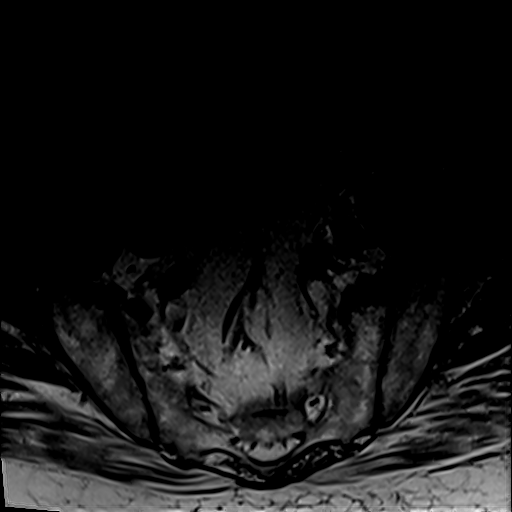
[im 5/42]
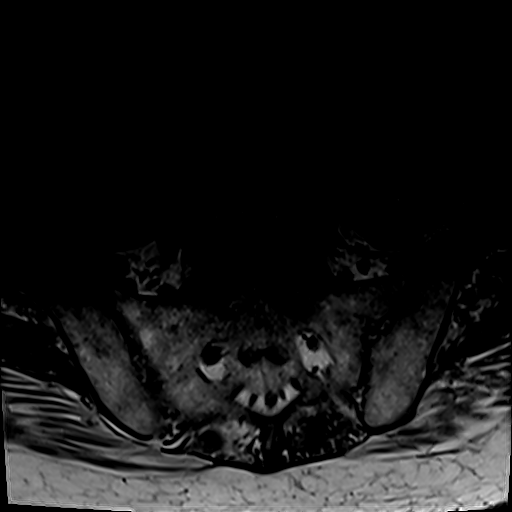
[im 14/42]
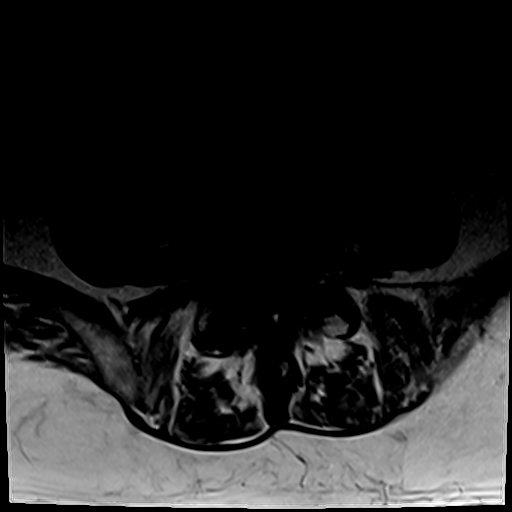
[im 19/42]
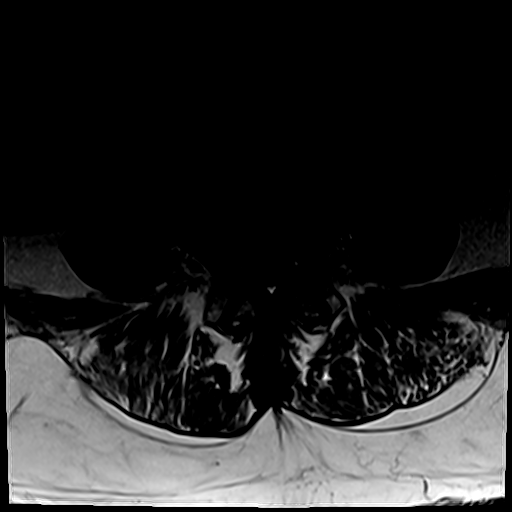
[im 23/42]
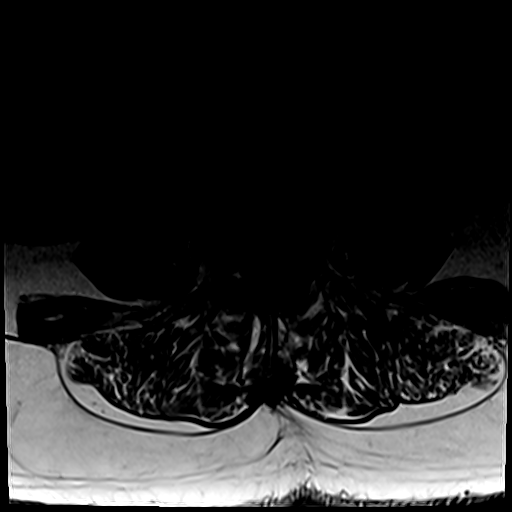
[im 28/42]
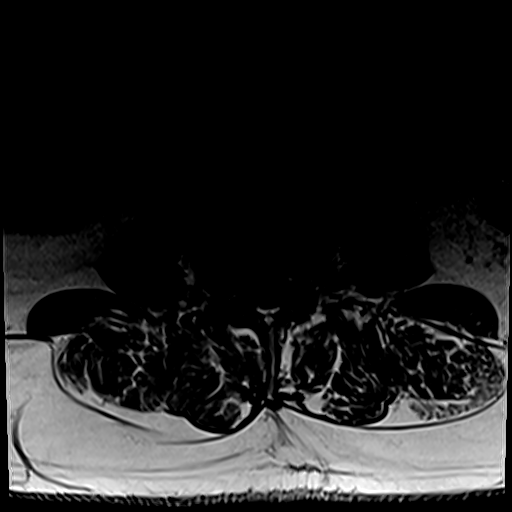
[im 37/42]
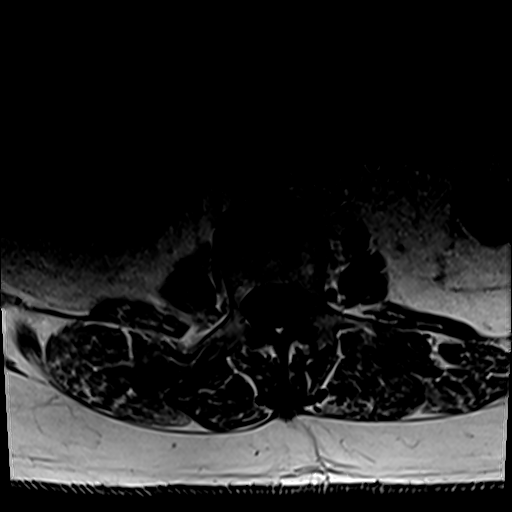
[im 42/42]
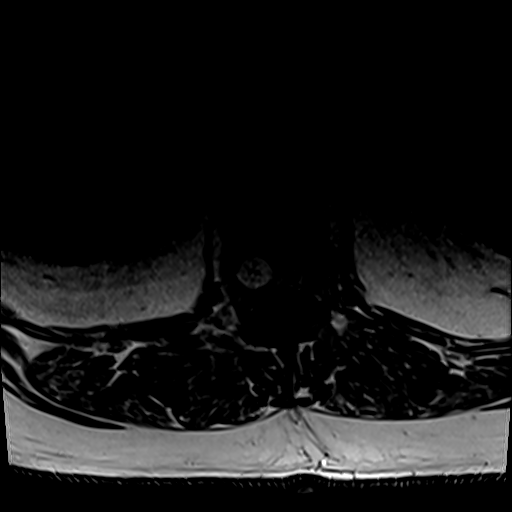

[Series 9: T2 post-contrast · sagittal · 4.0mm · 0.81mm/px · 4 of 17 slices shown]
[im 1/17]
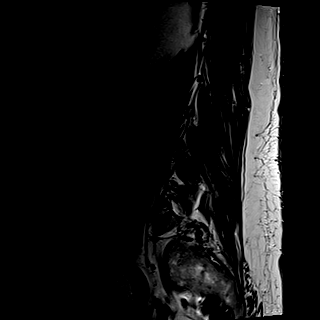
[im 6/17]
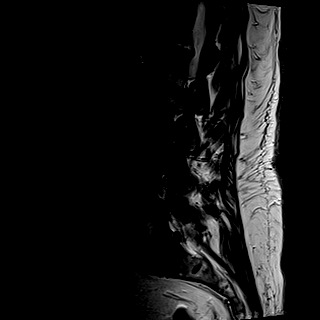
[im 11/17]
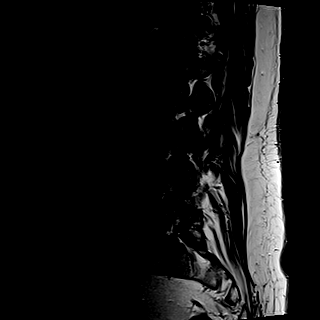
[im 17/17]
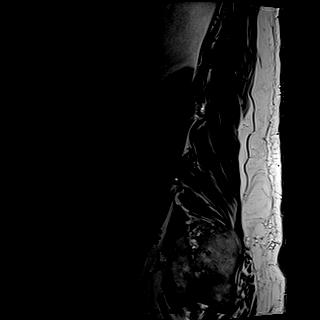

[Series 10: T1 fat-sat post-contrast · sagittal · 4.0mm · 0.81mm/px · 4 of 17 slices shown]
[im 1/17]
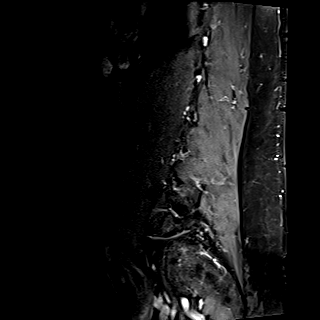
[im 6/17]
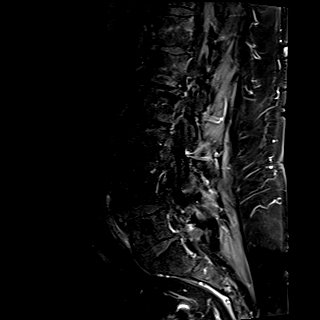
[im 11/17]
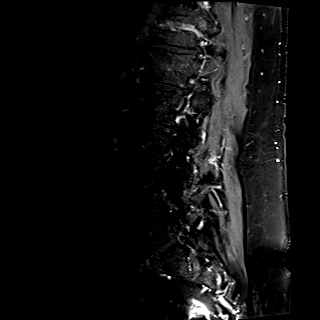
[im 17/17]
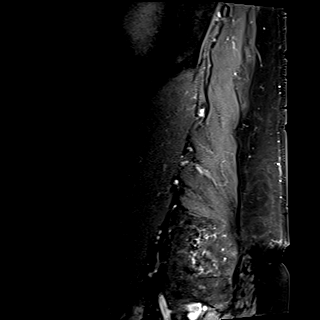

[30 of 48 positions shown; findings below may reference images not displayed]

FINDINGS: Segmentation:  5 lumbar type vertebral bodies.

Alignment: Mild scoliotic curvature convex to the right. One or 2 mm
of degenerative anterolisthesis at L4-5.

Vertebrae: No regional fracture. Benign appearing lipoma or
hemangioma within the T12 vertebral body. Second benign appearing
hemangioma within the L5 vertebral body. Discogenic endplate marrow
changes throughout the region, with mild edema and enhancement at
the L1-2 and L2-3 level.

Conus medullaris and cauda equina: Conus extends to the L1 level.
Conus and cauda equina appear normal.

Paraspinal and other soft tissues: Negative

Disc levels:

T12-L1: Mild noncompressive disc bulge.

L1-2: Endplate osteophytes and bulging of the disc. Mild facet and
ligamentous hypertrophy. Moderate multifactorial stenosis at this
level with crowding of the nerve roots.

L2-3: Endplate osteophytes and bulging of the disc. Left
posterolateral disc herniation with a 9 mm fragment migrated
caudally in the left lateral recess which could focally compress the
left L3 nerve. Mild stenosis of the right lateral recess.

L3-4: Left posterolateral to foraminal disc herniation. Stenosis of
both lateral recesses. Left foraminal stenosis on the left that
could compress the exiting L3 nerve.

L4-5: Chronic facet arthropathy with 1 or 2 mm of anterolisthesis.
Mild bulging of the disc. Moderate stenosis of the canal, most
severe in the lateral recesses. Neural foraminal stenosis right
worse than left. Neural compression could occur on either side at
this level.

L5-S1: Endplate osteophytes and mild bulging of the disc. Facet
degeneration and hypertrophy. Mild narrowing of the subarticular
lateral recesses and neural foramina but no likely neural
compression.
IMPRESSION: No sign of metastatic disease to bone. Benign appearing hemangiomas
within the T12 and L5 vertebral bodies. Discogenic endplate marrow
changes, including mild edema and enhancement at the L1-2 and L2-3
levels.

L1-2: Moderate multifactorial stenosis with crowding of the nerve
roots could be symptomatic.

L2-3: Stenosis of both lateral recesses could cause neural
compression. 9 mm caudally migrated disc herniation in the left
lateral recess could focally affect the left L3 nerve.

L3-4: Circumferential disc protrusion with a left foraminal to
extraforaminal herniation. Stenosis both lateral recesses could
cause neural compression. Left foraminal stenosis likely to compress
the L3 nerve.

L4-5: Chronic facet arthropathy with 1 or 2 mm of anterolisthesis.
Bulging of the disc. Moderate canal stenosis, most severe in the
lateral recesses, could cause neural compression on either or both
sides. Foraminal stenosis worse on the right could cause neural
compression.

L5-S1: Disc bulge. Facet degeneration and hypertrophy. No
compressive stenosis.

## 2021-09-22 MED ORDER — GADOBUTROL 1 MMOL/ML IV SOLN
10.0000 mL | Freq: Once | INTRAVENOUS | Status: AC | PRN
  Administered 2021-09-22: 10 mL via INTRAVENOUS

## 2021-09-22 MED ORDER — LACTATED RINGERS IV BOLUS
500.0000 mL | Freq: Once | INTRAVENOUS | Status: AC
  Administered 2021-09-22: 500 mL via INTRAVENOUS

## 2021-09-22 MED ORDER — DEXAMETHASONE SODIUM PHOSPHATE 10 MG/ML IJ SOLN
10.0000 mg | Freq: Once | INTRAMUSCULAR | Status: AC
  Administered 2021-09-22: 10 mg via INTRAVENOUS
  Filled 2021-09-22 (×2): qty 1

## 2021-09-22 MED ORDER — METOPROLOL SUCCINATE ER 50 MG PO TB24
75.0000 mg | ORAL_TABLET | Freq: Every morning | ORAL | Status: DC
  Administered 2021-09-23: 75 mg via ORAL
  Filled 2021-09-22: qty 2

## 2021-09-22 MED ORDER — HYDROMORPHONE HCL 1 MG/ML IJ SOLN: 1.0000 mg | INTRAMUSCULAR | Status: DC | PRN

## 2021-09-22 MED ORDER — ACETAMINOPHEN 650 MG RE SUPP: 650.0000 mg | Freq: Four times a day (QID) | RECTAL | Status: DC | PRN

## 2021-09-22 MED ORDER — IBUPROFEN 800 MG PO TABS
800.0000 mg | ORAL_TABLET | Freq: Four times a day (QID) | ORAL | Status: DC | PRN
  Administered 2021-09-22: 800 mg via ORAL
  Filled 2021-09-22: qty 1

## 2021-09-22 MED ORDER — ASCORBIC ACID 500 MG PO TABS
1000.0000 mg | ORAL_TABLET | Freq: Every day | ORAL | Status: DC
  Administered 2021-09-22 – 2021-09-28 (×7): 1000 mg via ORAL
  Filled 2021-09-22 (×7): qty 2

## 2021-09-22 MED ORDER — ATORVASTATIN CALCIUM 80 MG PO TABS
80.0000 mg | ORAL_TABLET | Freq: Every evening | ORAL | Status: DC
  Administered 2021-09-22 – 2021-09-28 (×7): 80 mg via ORAL
  Filled 2021-09-22: qty 1
  Filled 2021-09-22: qty 2
  Filled 2021-09-22 (×4): qty 1
  Filled 2021-09-22: qty 2

## 2021-09-22 MED ORDER — MAGNESIUM OXIDE -MG SUPPLEMENT 400 (240 MG) MG PO TABS
800.0000 mg | ORAL_TABLET | Freq: Once | ORAL | Status: AC
  Administered 2021-09-22: 800 mg via ORAL
  Filled 2021-09-22: qty 2

## 2021-09-22 MED ORDER — ASPIRIN 81 MG PO CHEW
81.0000 mg | CHEWABLE_TABLET | Freq: Every day | ORAL | Status: DC
  Administered 2021-09-22 – 2021-09-28 (×7): 81 mg via ORAL
  Filled 2021-09-22 (×7): qty 1

## 2021-09-22 MED ORDER — ACETAMINOPHEN 325 MG PO TABS: 650.0000 mg | ORAL_TABLET | Freq: Four times a day (QID) | ORAL | Status: DC | PRN

## 2021-09-22 MED ORDER — ALLOPURINOL 100 MG PO TABS
200.0000 mg | ORAL_TABLET | Freq: Every evening | ORAL | Status: DC
  Administered 2021-09-22 – 2021-09-28 (×7): 200 mg via ORAL
  Filled 2021-09-22 (×7): qty 2

## 2021-09-22 MED ORDER — AMLODIPINE BESYLATE 2.5 MG PO TABS
2.5000 mg | ORAL_TABLET | Freq: Every day | ORAL | Status: DC
  Administered 2021-09-22 – 2021-09-28 (×7): 2.5 mg via ORAL
  Filled 2021-09-22 (×7): qty 1

## 2021-09-22 MED ORDER — RAMIPRIL 5 MG PO CAPS
10.0000 mg | ORAL_CAPSULE | Freq: Every day | ORAL | Status: DC
  Administered 2021-09-22 – 2021-09-28 (×7): 10 mg via ORAL
  Filled 2021-09-22 (×7): qty 2

## 2021-09-22 MED ORDER — DEXAMETHASONE SODIUM PHOSPHATE 4 MG/ML IJ SOLN
4.0000 mg | Freq: Four times a day (QID) | INTRAMUSCULAR | Status: DC
  Administered 2021-09-23 – 2021-09-28 (×20): 4 mg via INTRAVENOUS
  Filled 2021-09-22 (×20): qty 1

## 2021-09-22 MED ORDER — ADULT MULTIVITAMIN W/MINERALS CH
1.0000 | ORAL_TABLET | Freq: Every day | ORAL | Status: DC
  Administered 2021-09-22 – 2021-09-28 (×7): 1 via ORAL
  Filled 2021-09-22 (×7): qty 1

## 2021-09-22 MED ORDER — KETOROLAC TROMETHAMINE 15 MG/ML IJ SOLN
15.0000 mg | Freq: Once | INTRAMUSCULAR | Status: AC
  Administered 2021-09-22: 15 mg via INTRAVENOUS
  Filled 2021-09-22: qty 1

## 2021-09-22 MED ORDER — OXYCODONE HCL 5 MG PO TABS
5.0000 mg | ORAL_TABLET | ORAL | Status: DC | PRN
  Administered 2021-09-23: 5 mg via ORAL
  Filled 2021-09-22: qty 1

## 2021-09-22 MED ORDER — ONDANSETRON HCL 4 MG/2ML IJ SOLN: 4.0000 mg | Freq: Four times a day (QID) | INTRAMUSCULAR | Status: DC | PRN

## 2021-09-22 MED ORDER — HYDRALAZINE HCL 20 MG/ML IJ SOLN: 2.0000 mg | INTRAMUSCULAR | Status: DC | PRN

## 2021-09-22 MED ORDER — METHOCARBAMOL 1000 MG/10ML IJ SOLN
500.0000 mg | Freq: Four times a day (QID) | INTRAVENOUS | Status: DC | PRN
  Filled 2021-09-22 (×2): qty 5

## 2021-09-22 MED ORDER — ONDANSETRON HCL 4 MG PO TABS: 4.0000 mg | ORAL_TABLET | Freq: Four times a day (QID) | ORAL | Status: DC | PRN

## 2021-09-22 MED ORDER — POTASSIUM CHLORIDE CRYS ER 20 MEQ PO TBCR
40.0000 meq | EXTENDED_RELEASE_TABLET | Freq: Once | ORAL | Status: AC
  Administered 2021-09-22: 40 meq via ORAL
  Filled 2021-09-22: qty 2

## 2021-09-22 MED ORDER — PANTOPRAZOLE 80MG IVPB - SIMPLE MED
80.0000 mg | Freq: Two times a day (BID) | INTRAVENOUS | Status: DC
Start: 2021-09-22 — End: 2021-09-24
  Administered 2021-09-23 (×3): 80 mg via INTRAVENOUS
  Filled 2021-09-22 (×2): qty 100
  Filled 2021-09-22 (×3): qty 80
  Filled 2021-09-22 (×2): qty 100

## 2021-09-22 MED ORDER — HYDRALAZINE HCL 20 MG/ML IJ SOLN: 10.0000 mg | INTRAMUSCULAR | Status: DC | PRN

## 2021-09-22 NOTE — ED Triage Notes (Signed)
Pt BIB EMS from home. Pt realized around 2000 that he could not walk. Pt reports with leg weakness and pain.   198/112 81 hr 97% room air

## 2021-09-22 NOTE — ED Notes (Signed)
Tried to call for report. Per staff in the floor they will call me back if they ready.

## 2021-09-22 NOTE — H&P (Signed)
Sean [name] is an 75 y.o. male.   Chief Complaint: Bilateral leg pain HPI: 75 year old gentleman with chronic back pain chronic knee pain and hip pain.  He has been working with orthopedics and has been told he needs knee replacements possible hip replacements he is also chronic back pain is gotten injections for.  In addition he also reports that he has been working with a chiropractor to help him with left leg pain from the front of his quad hip to his knee.  He has been having some increasing difficulty ambulating around his house secondary to pain and weakness he describes.  Last night he had a mild fall where really he sounds again slid off the bed to the floor and was not able to get up ultimately called EMS and brought to the emergency department.  Currently he says he is not not terrible amount of pain it all depends on which way he moves.  He has been voiding at home although he has not voided in the ER denies any other saddle anesthesia numbness in his groin difficulty with bowel or bladder.  Past Medical History:  Diagnosis Date   Arthritis    Carpal tunnel syndrome    bilat    Cataract    forming    Coronary artery disease CARDIOLOGIST- DR CRENSHAW   Depression    history of at age 56-64 years old; denies current issues    H/O simple renal cyst    Hematuria    History of concussion AS  CHILD-- NO RESIDUAL   History of kidney stones    History of prostate cancer    S/P RADIOACTIVE SEED IMPLANTS AND EXTERNAL RADIATION---  NO RECURRENCE   History of radiation therapy    25 treatments    History of urinary tract infection    History of vertigo    Hypertension    Nocturia    PONV (postoperative nausea and vomiting)    Prostate cancer (HCC)    radiation prior to seed implants    Shortness of breath dyspnea    with increased exertion    Urethral stricture    Wears glasses     Past Surgical History:  Procedure Laterality Date   CARDIAC CATHETERIZATION  12-18-2003    SEVERE TWO VESSEL CAD/  FIRST DIAGONAL  95% STENOSIS/ CIRCUMFLEX 95% STENOSIS IN THE OSTIUM OF LARGE FIRST OM THAT WAS OCCLUDED AFTER THE FIRST MARGINAL/  COLLATERAL FLOW FROM THE LAD TO THE OBTUSE MARGINALS/  EF 50%---- MEDICAL MANAGEMENT   CARDIOVASCULAR STRESS TEST  05-16-2011   DR CRENSHAW   PROBABLE NORMAL PERFUSION AND SOFT TISSUE ATTENUATION/ NO SIGNIFICANT ISCHEMIA OR SCAR /  EF  59%   COLONOSCOPY     COLONSCOPY WITH POLYPS REMOVED     CYSTOSCOPY N/A 06/06/2016   Procedure: CYSTOSCOPY FLEXIBLE;  Surgeon: Raynelle Bring, MD;  Location: [facility];  Service: Urology;  Laterality: N/A;   CYSTOSCOPY WITH URETHRAL DILATATION  04/25/2012   Procedure: CYSTOSCOPY WITH URETHRAL DILATATION;  Surgeon: Franchot Gallo, MD;  Location: [facility];  Service: Urology;  Laterality: N/A;   CYSTOSCOPY, BALLOON DILATION OF URETHRAL STRICTURE   CYSTOSCOPY WITH URETHRAL DILATATION N/A 04/12/2013   Procedure: CYSTOSCOPY WITH URETHRAL DILATATION;  Surgeon: Franchot Gallo, MD;  Location: [facility];  Service: Urology;  Laterality: N/A;  45 MIN    POLYPECTOMY     RADIOACTIVE SEED IMPLANTS, PROSTATE  11-26-2010   RIGHT URETEROSCOPIC STONE EXTRACTION  09-04-2003  ROBOTIC ASSITED PARTIAL NEPHRECTOMY Left 06/06/2016   Procedure: XI ROBOTIC ASSITED PARTIAL NEPHRECTOMY;  Surgeon: Raynelle Bring, MD;  Location: [facility];  Service: Urology;  Laterality: Left;   TRANSTHORACIC ECHOCARDIOGRAM  09-22-2003   LVSF NORMAL/  MILD TO MODERATE PULMONARY REGURG.    Family History  Problem Relation Age of Onset   Diabetes Father    Colon cancer Maternal Uncle    Esophageal cancer Neg Hx    Rectal cancer Neg Hx    Stomach cancer Neg Hx    Colon polyps Neg Hx    Social History:  reports that he has never smoked. He has never used smokeless tobacco. He reports that he does not drink alcohol and does not use drugs.  Allergies:  Allergies  Allergen Reactions   Morphine And Related Nausea And  Vomiting    Severe vomiting    Codeine Other (See Comments)    constipation   Lariam [Mefloquine Hcl] Other (See Comments)    Respiratory problems    (Not in a hospital admission)   Results for orders placed or performed during the hospital encounter of 09/22/21 (from the past 48 hour(s))  CBC with Differential     Status: Abnormal   Collection Time: 09/22/21  6:10 AM  Result Value Ref Range   WBC 11.7 (H) 4.0 - 10.5 K/uL   RBC 4.98 4.22 - 5.81 MIL/uL   Hemoglobin 16.3 13.0 - 17.0 g/dL   HCT 46.1 39.0 - 52.0 %   MCV 92.6 80.0 - 100.0 fL   MCH 32.7 26.0 - 34.0 pg   MCHC 35.4 30.0 - 36.0 g/dL   RDW 13.0 11.5 - 15.5 %   Platelets 166 150 - 400 K/uL   nRBC 0.0 0.0 - 0.2 %   Neutrophils Relative % 76 %   Neutro Abs 8.9 (H) 1.7 - 7.7 K/uL   Lymphocytes Relative 11 %   Lymphs Abs 1.3 0.7 - 4.0 K/uL   Monocytes Relative 11 %   Monocytes Absolute 1.3 (H) 0.1 - 1.0 K/uL   Eosinophils Relative 1 %   Eosinophils Absolute 0.1 0.0 - 0.5 K/uL   Basophils Relative 1 %   Basophils Absolute 0.1 0.0 - 0.1 K/uL   Immature Granulocytes 0 %   Abs Immature Granulocytes 0.05 0.00 - 0.07 K/uL    Comment: Performed at [facility], [place] [address] [place], [place] [address]  Comprehensive metabolic panel     Status: Abnormal   Collection Time: 09/22/21  6:10 AM  Result Value Ref Range   Sodium 139 135 - 145 mmol/L   Potassium 2.9 (L) 3.5 - 5.1 mmol/L   Chloride 107 98 - 111 mmol/L   CO2 21 (L) 22 - 32 mmol/L   Glucose, Bld 158 (H) 70 - 99 mg/dL    Comment: Glucose reference range applies only to samples taken after fasting for at least 8 hours.   BUN 23 8 - 23 mg/dL   Creatinine, Ser 1.15 0.61 - 1.24 mg/dL   Calcium 8.9 8.9 - 10.3 mg/dL   Total Protein 6.8 6.5 - 8.1 g/dL   Albumin 3.9 3.5 - 5.0 g/dL   AST 38 15 - 41 U/L   ALT 42 0 - 44 U/L   Alkaline Phosphatase 76 38 - 126 U/L   Total Bilirubin 1.3 (H) 0.3 - 1.2 mg/dL   GFR, Estimated >60 >60 mL/min    Comment:  (NOTE) Calculated using the CKD-EPI Creatinine Equation (2021)    Anion  gap 11 5 - 15    Comment: Performed at [facility], [place] [address], [place], [place] [address]   MR Lumbar Spine W Wo Contrast  Result Date: 09/22/2021 CLINICAL DATA:  Bone neoplasm suspected. History of prostate cancer. Bilateral lower extremity weakness with difficulty walking. EXAM: MRI LUMBAR SPINE WITHOUT AND WITH CONTRAST TECHNIQUE: Multiplanar and multiecho pulse sequences of the lumbar spine were obtained without and with intravenous contrast. CONTRAST:  71mL GADAVIST GADOBUTROL 1 MMOL/ML IV SOLN COMPARISON:  CT abdomen 07/16/2021 FINDINGS: Segmentation:  5 lumbar type vertebral bodies. Alignment: Mild scoliotic curvature convex to the right. One or 2 mm of degenerative anterolisthesis at L4-5. Vertebrae: No regional fracture. Benign appearing lipoma or hemangioma within the T12 vertebral body. Second benign appearing hemangioma within the L5 vertebral body. Discogenic endplate marrow changes throughout the region, with mild edema and enhancement at the L1-2 and L2-3 level. Conus medullaris and cauda equina: Conus extends to the L1 level. Conus and cauda equina appear normal. Paraspinal and other soft tissues: Negative Disc levels: T12-L1: Mild noncompressive disc bulge. L1-2: Endplate osteophytes and bulging of the disc. Mild facet and ligamentous hypertrophy. Moderate multifactorial stenosis at this level with crowding of the nerve roots. L2-3: Endplate osteophytes and bulging of the disc. Left posterolateral disc herniation with a 9 mm fragment migrated caudally in the left lateral recess which could focally compress the left L3 nerve. Mild stenosis of the right lateral recess. L3-4: Left posterolateral to foraminal disc herniation. Stenosis of both lateral recesses. Left foraminal stenosis on the left that could compress the exiting L3 nerve. L4-5: Chronic facet arthropathy with 1 or 2 mm of  anterolisthesis. Mild bulging of the disc. Moderate stenosis of the canal, most severe in the lateral recesses. Neural foraminal stenosis right worse than left. Neural compression could occur on either side at this level. L5-S1: Endplate osteophytes and mild bulging of the disc. Facet degeneration and hypertrophy. Mild narrowing of the subarticular lateral recesses and neural foramina but no likely neural compression. IMPRESSION: No sign of metastatic disease to bone. Benign appearing hemangiomas within the T12 and L5 vertebral bodies. Discogenic endplate marrow changes, including mild edema and enhancement at the L1-2 and L2-3 levels. L1-2: Moderate multifactorial stenosis with crowding of the nerve roots could be symptomatic. L2-3: Stenosis of both lateral recesses could cause neural compression. 9 mm caudally migrated disc herniation in the left lateral recess could focally affect the left L3 nerve. L3-4: Circumferential disc protrusion with a left foraminal to extraforaminal herniation. Stenosis both lateral recesses could cause neural compression. Left foraminal stenosis likely to compress the L3 nerve. L4-5: Chronic facet arthropathy with 1 or 2 mm of anterolisthesis. Bulging of the disc. Moderate canal stenosis, most severe in the lateral recesses, could cause neural compression on either or both sides. Foraminal stenosis worse on the right could cause neural compression. L5-S1: Disc bulge. Facet degeneration and hypertrophy. No compressive stenosis. Electronically Signed   By: Nelson Chimes M.D.   On: 09/22/2021 11:58   DG Hip Unilat W or Wo Pelvis 2-3 Views Left  Result Date: 09/22/2021 CLINICAL DATA:  Pain, difficulty walking EXAM: DG HIP (WITH OR WITHOUT PELVIS) 2-3V LEFT COMPARISON:  None. FINDINGS: There is evidence of previous brachytherapy with numerous metallic densities in the region of prostate. No recent fracture or dislocation is seen. Small bony spurs seen in the lateral aspect of left hip.  There are no focal lytic or sclerotic lesions. Degenerative changes are noted in the  visualized lower lumbar spine. IMPRESSION: No fracture or dislocation is seen. There are no focal lytic or sclerotic lesions. Degenerative changes are noted with small bony spurs in the lateral aspect of left hip. Degenerative changes are noted in the visualized lower lumbar spine. Electronically Signed   By: Elmer Picker M.D.   On: 09/22/2021 10:20    Review of Systems  Musculoskeletal:  Positive for back pain.  Neurological:  Positive for weakness and numbness.   Blood pressure (!) 156/86, pulse 88, temperature 99 F (37.2 C), temperature source Oral, resp. rate 16, SpO2 95 %. Physical Exam HENT:     Head: Normocephalic.     Nose: Nose normal.     Mouth/Throat:     Mouth: Mucous membranes are moist.  Eyes:     Pupils: Pupils are equal, round, and reactive to light.  Cardiovascular:     Rate and Rhythm: Normal rate.  Pulmonary:     Effort: Pulmonary effort is normal.  Abdominal:     General: Abdomen is flat.  Musculoskeletal:        General: Normal range of motion.  Skin:    General: Skin is warm.  Neurological:     Mental Status: He is alert.     Comments: Patient is awake and alert strength right lower extremity is 5 out of 5 iliopsoas, quads, hamstrings, gastrocs, into tibialis, EHL.  Left lower extremity has 5 out of 5 strength in his iliopsoas has 4-4+ weakness in his left quad although may be pain limited the pain prevented him from giving full effort I believe, 5 out of 5 anterior tibialis and gastrocs and EHL.  Sensation appears to be grossly intact but is having a lot of pain around the left knee joint     Assessment/Plan 75 year old gentleman with back and leg pain.  It is difficult to determine whether the pain is more in his anterior quad which would be consistent with his L2-3 disc herniation or its more related to his chronic spinal stenosis to which I assume that studies been  get a lot of injection treatments for.  He does have some pain in the back of his hamstrings on the left calf on the right that would be more consistent with the L4-5 stenosis.  In addition the works been doing with a chiropractor has been for the anterior quad pain so the left L3 radicular symptoms are also chronic.  It is also difficult to determine how much of his pain is just an acute exacerbation of a chronic problem and limiting his strength versus actually true weakness.  Certainly he has a free fragment disclamation L2-3 on the left which would explain an L3 radiculopathy he is got spinal stenosis at L45.  Both of which might need to be addressed.  He denies any significant medical history medical problems except for hypertension he is on antihypertensives and has been told he is prediabetic.  We will plan on admitting over at [facility] placed on IV steroids work with physical outpatient therapy and evaluate his progress if he improves we might build discharge him in follow-up if he does not we may have to consider addressing those 2 issues in his lumbar spine surgically.  Elaina Hoops, MD 09/22/2021, 1:19 PM

## 2021-09-22 NOTE — ED Provider Notes (Signed)
[facility] DEPT Provider Note   CSN: [id number] Arrival date & time: 09/22/21  0536     History Chief Complaint  Patient presents with   Leg Pain   Weakness    Sean [name] is a 75 y.o. male.   Leg Pain Associated symptoms: no back pain, no fever and no neck pain   Weakness Associated symptoms: no abdominal pain, no arthralgias, no chest pain, no cough, no dizziness, no dysuria, no fever, no myalgias, no nausea, no seizures, no shortness of breath and no vomiting   Patient presents for leg pain and weakness.  History is notable for prostate cancer, in remission.  He reports that he has had some mild bilateral proximal leg pain for several weeks.  Last night, he is feet slipped out from under him and he describes a fall in which his legs spread out to the sides.  He did not feel any immediate pain at that time.  He was able to walk after that fall.  Throughout the night, he experienced worsening pain and weakness to his left leg.  He has subsequently been unable to walk.  He describes himself scooting along the floor in his home due to his left leg weakness.  Patient has never experienced any symptoms like this before.  He did take ibuprofen last night.  He has not taken any thing for pain today.  He denies any difficulty with urination.  He denies any areas of numbness.  He denies any recent fevers or chills.    Past Medical History:  Diagnosis Date   Arthritis    Carpal tunnel syndrome    bilat    Cataract    forming    Coronary artery disease CARDIOLOGIST- DR CRENSHAW   Depression    history of at age 28-35 years old; denies current issues    H/O simple renal cyst    Hematuria    History of concussion AS  CHILD-- NO RESIDUAL   History of kidney stones    History of prostate cancer    S/P RADIOACTIVE SEED IMPLANTS AND EXTERNAL RADIATION---  NO RECURRENCE   History of radiation therapy    25 treatments    History of urinary tract  infection    History of vertigo    Hypertension    Nocturia    PONV (postoperative nausea and vomiting)    Prostate cancer (HCC)    radiation prior to seed implants    Shortness of breath dyspnea    with increased exertion    Urethral stricture    Wears glasses     Patient Active Problem List   Diagnosis Date Noted   Lumbar disc herniation 09/22/2021   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   HYPERLIPIDEMIA 11/26/2009   Essential hypertension 11/26/2009   Coronary atherosclerosis 11/26/2009    Past Surgical History:  Procedure Laterality Date   CARDIAC CATHETERIZATION  12-18-2003   SEVERE TWO VESSEL CAD/  FIRST DIAGONAL  95% STENOSIS/ CIRCUMFLEX 95% STENOSIS IN THE OSTIUM OF LARGE FIRST OM THAT WAS OCCLUDED AFTER THE FIRST MARGINAL/  COLLATERAL FLOW FROM THE LAD TO THE OBTUSE MARGINALS/  EF 50%---- MEDICAL MANAGEMENT   CARDIOVASCULAR STRESS TEST  05-16-2011   DR CRENSHAW   PROBABLE NORMAL PERFUSION AND SOFT TISSUE ATTENUATION/ NO SIGNIFICANT ISCHEMIA OR SCAR /  EF  59%   COLONOSCOPY     COLONSCOPY WITH POLYPS REMOVED     CYSTOSCOPY N/A 06/06/2016   Procedure: CYSTOSCOPY FLEXIBLE;  Surgeon: Raynelle Bring, MD;  Location: [facility];  Service: Urology;  Laterality: N/A;   CYSTOSCOPY WITH URETHRAL DILATATION  04/25/2012   Procedure: CYSTOSCOPY WITH URETHRAL DILATATION;  Surgeon: Franchot Gallo, MD;  Location: [facility];  Service: Urology;  Laterality: N/A;   CYSTOSCOPY, BALLOON DILATION OF URETHRAL STRICTURE   CYSTOSCOPY WITH URETHRAL DILATATION N/A 04/12/2013   Procedure: CYSTOSCOPY WITH URETHRAL DILATATION;  Surgeon: Franchot Gallo, MD;  Location: [facility];  Service: Urology;  Laterality: N/A;  45 MIN    POLYPECTOMY     RADIOACTIVE SEED IMPLANTS, PROSTATE  11-26-2010   RIGHT URETEROSCOPIC STONE EXTRACTION  09-04-2003   ROBOTIC ASSITED PARTIAL NEPHRECTOMY Left 06/06/2016   Procedure: XI ROBOTIC ASSITED PARTIAL NEPHRECTOMY;  Surgeon: Raynelle Bring,  MD;  Location: [facility];  Service: Urology;  Laterality: Left;   TRANSTHORACIC ECHOCARDIOGRAM  09-22-2003   LVSF NORMAL/  MILD TO MODERATE PULMONARY REGURG.       Family History  Problem Relation Age of Onset   Diabetes Father    Colon cancer Maternal Uncle    Esophageal cancer Neg Hx    Rectal cancer Neg Hx    Stomach cancer Neg Hx    Colon polyps Neg Hx     Social History   Tobacco Use   Smoking status: Never   Smokeless tobacco: Never  Substance Use Topics   Alcohol use: No   Drug use: No    Home Medications Prior to Admission medications   Medication Sig Start Date End Date Taking? Authorizing Provider  allopurinol (ZYLOPRIM) 100 MG tablet Take 200 mg by mouth every evening.    Yes [provider]  amLODipine (NORVASC) 2.5 MG tablet Take 2.5 mg by mouth daily.   Yes [provider]  APPLE CIDER VINEGAR PO Take 450 mg by mouth daily.   Yes [provider]  Ascorbic Acid (VITAMIN C) 1000 MG tablet Take 1,000 mg by mouth daily.   Yes [provider]  aspirin 81 MG chewable tablet Chew 81 mg by mouth daily.   Yes [provider]  atorvastatin (LIPITOR) 80 MG tablet Take 80 mg by mouth every evening.    Yes [provider]  Cinnamon 500 MG TABS Take 1,000 mg by mouth daily.   Yes [provider]  Ginger, Zingiber officinalis, (GINGER ROOT) 550 MG CAPS Take 550 mg by mouth daily.   Yes [provider]  ibuprofen (ADVIL) 200 MG tablet Take 800 mg by mouth every 6 (six) hours as needed for mild pain.   Yes [provider]  metoprolol succinate (TOPROL-XL) 50 MG 24 hr tablet Take 75 mg by mouth every morning. Take with or immediately following a meal.   Yes [provider]  Misc Natural Products (TART CHERRY ADVANCED PO) Take 1,200 mg by mouth daily.   Yes [provider]  Multiple Vitamin (MULTIVITAMIN) tablet Take 1 tablet by mouth daily.   Yes [provider]  ramipril  (ALTACE) 10 MG capsule Take 10 mg by mouth daily.    Yes [provider]  Turmeric 500 MG CAPS Take 500 mg by mouth daily. 12/29/15  Yes [provider]    Allergies    Morphine and related, Codeine, and Lariam [mefloquine hcl]  Review of Systems   Review of Systems  Constitutional:  Negative for chills and fever.  HENT:  Negative for ear pain and sore throat.   Eyes:  Negative for pain and visual disturbance.  Respiratory:  Negative for cough, chest tightness and shortness of breath.   Cardiovascular:  Negative for chest pain, palpitations and leg swelling.  Gastrointestinal:  Negative for abdominal pain, nausea and vomiting.  Genitourinary:  Negative for dysuria, flank pain and hematuria.  Musculoskeletal:  Positive for gait problem. Negative for arthralgias, back pain, joint swelling, myalgias and neck pain.  Skin:  Negative for color change, rash and wound.  Neurological:  Positive for weakness. Negative for dizziness, seizures, syncope, light-headedness and numbness.  Hematological:  Does not bruise/bleed easily.  Psychiatric/Behavioral:  Negative for confusion and decreased concentration.   All other systems reviewed and are negative.  Physical Exam Updated Vital Signs BP (!) 155/96   Pulse 86   Temp 99 F (37.2 C) (Oral)   Resp 16   SpO2 93%   Physical Exam Vitals and nursing note reviewed.  Constitutional:      General: He is not in acute distress.    Appearance: Normal appearance. He is well-developed. He is not ill-appearing, toxic-appearing or diaphoretic.  HENT:     Head: Normocephalic and atraumatic.     Right Ear: External ear normal.     Left Ear: External ear normal.     Nose: Nose normal.  Eyes:     Extraocular Movements: Extraocular movements intact.     Conjunctiva/sclera: Conjunctivae normal.  Cardiovascular:     Rate and Rhythm: Normal rate and regular rhythm.     Heart sounds: No murmur heard. Pulmonary:     Effort: Pulmonary  effort is normal. No respiratory distress.     Breath sounds: Normal breath sounds.  Chest:     Chest wall: No tenderness.  Abdominal:     Palpations: Abdomen is soft.     Tenderness: There is no abdominal tenderness.  Musculoskeletal:        General: No swelling, tenderness or deformity.     Cervical back: Normal range of motion and neck supple. No rigidity.     Right lower leg: No edema.     Left lower leg: No edema.  Skin:    General: Skin is warm and dry.     Capillary Refill: Capillary refill takes less than 2 seconds.     Coloration: Skin is not jaundiced or pale.  Neurological:     Mental Status: He is alert and oriented to person, place, and time.     Cranial Nerves: No cranial nerve deficit.     Sensory: No sensory deficit.     Motor: Weakness (3/5 strength with left hip flexion, 4/5 strength with left knee extension, 5/5 strength in distal LLE) present.  Psychiatric:        Mood and Affect: Mood normal.        Behavior: Behavior normal.        Thought Content: Thought content normal.        Judgment: Judgment normal.    ED Results / Procedures / Treatments   Labs (all labs ordered are listed, but only abnormal results are displayed) Labs Reviewed  CBC WITH DIFFERENTIAL/PLATELET - Abnormal; Notable for the following components:      Result Value   WBC 11.7 (*)    Neutro Abs 8.9 (*)    Monocytes Absolute 1.3 (*)    All other components within normal limits  COMPREHENSIVE METABOLIC PANEL - Abnormal; Notable for the following components:   Potassium 2.9 (*)    CO2 21 (*)    Glucose, Bld 158 (*)    Total Bilirubin 1.3 (*)  All other components within normal limits  RESP PANEL BY RT-PCR (FLU A&B, COVID) ARPGX2  URINALYSIS, ROUTINE W REFLEX MICROSCOPIC    EKG None  Radiology MR Lumbar Spine W Wo Contrast  Result Date: 09/22/2021 CLINICAL DATA:  Bone neoplasm suspected. History of prostate cancer. Bilateral lower extremity weakness with difficulty walking.  EXAM: MRI LUMBAR SPINE WITHOUT AND WITH CONTRAST TECHNIQUE: Multiplanar and multiecho pulse sequences of the lumbar spine were obtained without and with intravenous contrast. CONTRAST:  70mL GADAVIST GADOBUTROL 1 MMOL/ML IV SOLN COMPARISON:  CT abdomen 07/16/2021 FINDINGS: Segmentation:  5 lumbar type vertebral bodies. Alignment: Mild scoliotic curvature convex to the right. One or 2 mm of degenerative anterolisthesis at L4-5. Vertebrae: No regional fracture. Benign appearing lipoma or hemangioma within the T12 vertebral body. Second benign appearing hemangioma within the L5 vertebral body. Discogenic endplate marrow changes throughout the region, with mild edema and enhancement at the L1-2 and L2-3 level. Conus medullaris and cauda equina: Conus extends to the L1 level. Conus and cauda equina appear normal. Paraspinal and other soft tissues: Negative Disc levels: T12-L1: Mild noncompressive disc bulge. L1-2: Endplate osteophytes and bulging of the disc. Mild facet and ligamentous hypertrophy. Moderate multifactorial stenosis at this level with crowding of the nerve roots. L2-3: Endplate osteophytes and bulging of the disc. Left posterolateral disc herniation with a 9 mm fragment migrated caudally in the left lateral recess which could focally compress the left L3 nerve. Mild stenosis of the right lateral recess. L3-4: Left posterolateral to foraminal disc herniation. Stenosis of both lateral recesses. Left foraminal stenosis on the left that could compress the exiting L3 nerve. L4-5: Chronic facet arthropathy with 1 or 2 mm of anterolisthesis. Mild bulging of the disc. Moderate stenosis of the canal, most severe in the lateral recesses. Neural foraminal stenosis right worse than left. Neural compression could occur on either side at this level. L5-S1: Endplate osteophytes and mild bulging of the disc. Facet degeneration and hypertrophy. Mild narrowing of the subarticular lateral recesses and neural foramina but no  likely neural compression. IMPRESSION: No sign of metastatic disease to bone. Benign appearing hemangiomas within the T12 and L5 vertebral bodies. Discogenic endplate marrow changes, including mild edema and enhancement at the L1-2 and L2-3 levels. L1-2: Moderate multifactorial stenosis with crowding of the nerve roots could be symptomatic. L2-3: Stenosis of both lateral recesses could cause neural compression. 9 mm caudally migrated disc herniation in the left lateral recess could focally affect the left L3 nerve. L3-4: Circumferential disc protrusion with a left foraminal to extraforaminal herniation. Stenosis both lateral recesses could cause neural compression. Left foraminal stenosis likely to compress the L3 nerve. L4-5: Chronic facet arthropathy with 1 or 2 mm of anterolisthesis. Bulging of the disc. Moderate canal stenosis, most severe in the lateral recesses, could cause neural compression on either or both sides. Foraminal stenosis worse on the right could cause neural compression. L5-S1: Disc bulge. Facet degeneration and hypertrophy. No compressive stenosis. Electronically Signed   By: Nelson Chimes M.D.   On: 09/22/2021 11:58   DG Hip Unilat W or Wo Pelvis 2-3 Views Left  Result Date: 09/22/2021 CLINICAL DATA:  Pain, difficulty walking EXAM: DG HIP (WITH OR WITHOUT PELVIS) 2-3V LEFT COMPARISON:  None. FINDINGS: There is evidence of previous brachytherapy with numerous metallic densities in the region of prostate. No recent fracture or dislocation is seen. Small bony spurs seen in the lateral aspect of left hip. There are no focal lytic or sclerotic lesions. Degenerative changes  are noted in the visualized lower lumbar spine. IMPRESSION: No fracture or dislocation is seen. There are no focal lytic or sclerotic lesions. Degenerative changes are noted with small bony spurs in the lateral aspect of left hip. Degenerative changes are noted in the visualized lower lumbar spine. Electronically Signed   By:  Elmer Picker M.D.   On: 09/22/2021 10:20    Procedures Procedures   Medications Ordered in ED Medications  ramipril (ALTACE) capsule 10 mg (has no administration in time range)  atorvastatin (LIPITOR) tablet 80 mg (has no administration in time range)  allopurinol (ZYLOPRIM) tablet 200 mg (has no administration in time range)  metoprolol succinate (TOPROL-XL) 24 hr tablet 75 mg (has no administration in time range)  aspirin chewable tablet 81 mg (has no administration in time range)  amLODipine (NORVASC) tablet 2.5 mg (has no administration in time range)  multivitamin tablet 1 tablet (has no administration in time range)  ascorbic acid (VITAMIN C) tablet 1,000 mg (has no administration in time range)  acetaminophen (TYLENOL) tablet 650 mg (has no administration in time range)    Or  acetaminophen (TYLENOL) suppository 650 mg (has no administration in time range)  oxyCODONE (Oxy IR/ROXICODONE) immediate release tablet 5 mg (has no administration in time range)  methocarbamol (ROBAXIN) 500 mg in dextrose 5 % 50 mL IVPB (has no administration in time range)  hydrALAZINE (APRESOLINE) injection 2 mg (has no administration in time range)  dexamethasone (DECADRON) injection 4 mg (has no administration in time range)  ondansetron (ZOFRAN) tablet 4 mg (has no administration in time range)    Or  ondansetron (ZOFRAN) injection 4 mg (has no administration in time range)  HYDROmorphone (DILAUDID) injection 1 mg (has no administration in time range)  pantoprazole (PROTONIX) 80 mg /NS 100 mL IVPB (has no administration in time range)  dexamethasone (DECADRON) injection 10 mg (has no administration in time range)  lactated ringers bolus 500 mL (500 mLs Intravenous Bolus 09/22/21 0936)  ketorolac (TORADOL) 15 MG/ML injection 15 mg (15 mg Intravenous Given 09/22/21 0936)  potassium chloride SA (KLOR-CON) CR tablet 40 mEq (40 mEq Oral Given 09/22/21 1246)  magnesium oxide (MAG-OX) tablet 800 mg  (800 mg Oral Given 09/22/21 1247)  gadobutrol (GADAVIST) 1 MMOL/ML injection 10 mL (10 mLs Intravenous Contrast Given 09/22/21 1135)    ED Course  I have reviewed the triage vital signs and the nursing notes.  Pertinent labs & imaging results that were available during my care of the patient were reviewed by me and considered in my medical decision making (see chart for details).    MDM Rules/Calculators/A&P                          Patient is a 75 year old male presenting for left leg pain and weakness.  These symptoms following a mechanical fall at home that occurred last night at around 8 PM.  On arrival, vital signs are notable for moderate hypertension.  On exam, patient is well-appearing.  When sitting up, he states that his pain is minimal.  He declined any narcotic pain medication.  Dose of Toradol was ordered. He describes the area of his pain as his proximal, medial left lower extremity.  This area is without induration or tenderness.  He has no sensory loss.  He does appear to have weakness in the area of his left hip and knee.  X-ray imaging of left hip did not reveal any fractures or joint  abnormalities.  Given his acute weakness, MRI of lumbar spine was ordered.  Findings of MRI were concerning for left-sided L3 nerve compression.  Given that this does correlate with his symptoms, neurosurgery was consulted.  Patient was admitted to neurosurgery for further management.  Final Clinical Impression(s) / ED Diagnoses Final diagnoses:  Left leg weakness    Rx / DC Orders ED Discharge Orders     None        Godfrey Pick, MD 09/22/21 Johnnye Lana

## 2021-09-22 NOTE — ED Notes (Signed)
Pt provided some heat packs for his left leg.

## 2021-09-22 NOTE — Telephone Encounter (Signed)
Cathlyn Parsons, brother of the pt, would like for you to get a DPR to the pt.  Pt lives alone and no other family members can get to the hospital to check on him at this time.  Clair Gulling lives in [place] and it is 1 1/2 hour drive if he needs to come.  Cb [id number]. [id number]

## 2021-09-22 NOTE — ED Notes (Signed)
Pt transported to MRI 

## 2021-09-23 DIAGNOSIS — M1000 Idiopathic gout, unspecified site: Secondary | ICD-10-CM | POA: Diagnosis not present

## 2021-09-23 DIAGNOSIS — E669 Obesity, unspecified: Secondary | ICD-10-CM | POA: Diagnosis present

## 2021-09-23 DIAGNOSIS — G8929 Other chronic pain: Secondary | ICD-10-CM | POA: Diagnosis present

## 2021-09-23 DIAGNOSIS — M6259 Muscle wasting and atrophy, not elsewhere classified, multiple sites: Secondary | ICD-10-CM | POA: Diagnosis not present

## 2021-09-23 DIAGNOSIS — Z888 Allergy status to other drugs, medicaments and biological substances status: Secondary | ICD-10-CM | POA: Diagnosis not present

## 2021-09-23 DIAGNOSIS — M5116 Intervertebral disc disorders with radiculopathy, lumbar region: Secondary | ICD-10-CM | POA: Diagnosis present

## 2021-09-23 DIAGNOSIS — M1990 Unspecified osteoarthritis, unspecified site: Secondary | ICD-10-CM | POA: Diagnosis present

## 2021-09-23 DIAGNOSIS — E569 Vitamin deficiency, unspecified: Secondary | ICD-10-CM | POA: Diagnosis not present

## 2021-09-23 DIAGNOSIS — I10 Essential (primary) hypertension: Secondary | ICD-10-CM | POA: Diagnosis present

## 2021-09-23 DIAGNOSIS — Z8546 Personal history of malignant neoplasm of prostate: Secondary | ICD-10-CM | POA: Diagnosis not present

## 2021-09-23 DIAGNOSIS — R41841 Cognitive communication deficit: Secondary | ICD-10-CM | POA: Diagnosis not present

## 2021-09-23 DIAGNOSIS — R7303 Prediabetes: Secondary | ICD-10-CM | POA: Diagnosis present

## 2021-09-23 DIAGNOSIS — Z800 Family history of malignant neoplasm of digestive organs: Secondary | ICD-10-CM | POA: Diagnosis not present

## 2021-09-23 DIAGNOSIS — Z87820 Personal history of traumatic brain injury: Secondary | ICD-10-CM | POA: Diagnosis not present

## 2021-09-23 DIAGNOSIS — Z20822 Contact with and (suspected) exposure to covid-19: Secondary | ICD-10-CM | POA: Diagnosis present

## 2021-09-23 DIAGNOSIS — Z923 Personal history of irradiation: Secondary | ICD-10-CM | POA: Diagnosis not present

## 2021-09-23 DIAGNOSIS — M5126 Other intervertebral disc displacement, lumbar region: Secondary | ICD-10-CM | POA: Diagnosis not present

## 2021-09-23 DIAGNOSIS — E785 Hyperlipidemia, unspecified: Secondary | ICD-10-CM | POA: Diagnosis not present

## 2021-09-23 DIAGNOSIS — M1380 Other specified arthritis, unspecified site: Secondary | ICD-10-CM | POA: Diagnosis not present

## 2021-09-23 DIAGNOSIS — Z885 Allergy status to narcotic agent status: Secondary | ICD-10-CM | POA: Diagnosis not present

## 2021-09-23 DIAGNOSIS — I2510 Atherosclerotic heart disease of native coronary artery without angina pectoris: Secondary | ICD-10-CM | POA: Diagnosis present

## 2021-09-23 DIAGNOSIS — M545 Low back pain, unspecified: Secondary | ICD-10-CM | POA: Diagnosis not present

## 2021-09-23 DIAGNOSIS — Z7401 Bed confinement status: Secondary | ICD-10-CM | POA: Diagnosis not present

## 2021-09-23 DIAGNOSIS — Z833 Family history of diabetes mellitus: Secondary | ICD-10-CM | POA: Diagnosis not present

## 2021-09-23 DIAGNOSIS — Z7982 Long term (current) use of aspirin: Secondary | ICD-10-CM | POA: Diagnosis not present

## 2021-09-23 DIAGNOSIS — Z741 Need for assistance with personal care: Secondary | ICD-10-CM | POA: Diagnosis not present

## 2021-09-23 DIAGNOSIS — Z6836 Body mass index (BMI) 36.0-36.9, adult: Secondary | ICD-10-CM | POA: Diagnosis not present

## 2021-09-23 DIAGNOSIS — Z87440 Personal history of urinary (tract) infections: Secondary | ICD-10-CM | POA: Diagnosis not present

## 2021-09-23 DIAGNOSIS — M4986 Spondylopathy in diseases classified elsewhere, lumbar region: Secondary | ICD-10-CM | POA: Diagnosis not present

## 2021-09-23 DIAGNOSIS — M48061 Spinal stenosis, lumbar region without neurogenic claudication: Secondary | ICD-10-CM | POA: Diagnosis present

## 2021-09-23 DIAGNOSIS — Z751 Person awaiting admission to adequate facility elsewhere: Secondary | ICD-10-CM | POA: Diagnosis not present

## 2021-09-23 DIAGNOSIS — R0902 Hypoxemia: Secondary | ICD-10-CM | POA: Diagnosis not present

## 2021-09-23 DIAGNOSIS — R5383 Other fatigue: Secondary | ICD-10-CM | POA: Diagnosis not present

## 2021-09-23 DIAGNOSIS — Z79899 Other long term (current) drug therapy: Secondary | ICD-10-CM | POA: Diagnosis not present

## 2021-09-23 DIAGNOSIS — R29898 Other symptoms and signs involving the musculoskeletal system: Secondary | ICD-10-CM | POA: Diagnosis not present

## 2021-09-23 LAB — URINALYSIS, ROUTINE W REFLEX MICROSCOPIC
Bacteria, UA: NONE SEEN
Bilirubin Urine: NEGATIVE
Glucose, UA: NEGATIVE mg/dL
Hgb urine dipstick: NEGATIVE
Ketones, ur: NEGATIVE mg/dL
Leukocytes,Ua: NEGATIVE
Nitrite: NEGATIVE
Protein, ur: NEGATIVE mg/dL
Specific Gravity, Urine: 1.033 — ABNORMAL HIGH (ref 1.005–1.030)
pH: 5 (ref 5.0–8.0)

## 2021-09-23 LAB — PROTIME-INR
INR: 1.1 (ref 0.8–1.2)
Prothrombin Time: 14.4 seconds (ref 11.4–15.2)

## 2021-09-23 NOTE — Progress Notes (Signed)
Subjective: Patient reports  patient with increased pain this afternoon primarily left quad denies any significant right leg pain or pain below his knees.  Objective: Vital signs in last 24 hours: Temp:  [98.1 F (36.7 C)-99.1 F (37.3 C)] 98.6 F (37 C) (11/03 1545) Pulse Rate:  [71-97] 71 (11/03 1545) Resp:  [16-20] 16 (11/03 1545) BP: (121-182)/(65-107) 121/65 (11/03 1545) SpO2:  [90 %-96 %] 94 % (11/03 1545) Weight:  [678.9 kg] 116.2 kg (11/02 1928)  Intake/Output from previous day: 11/02 0701 - 11/03 0700 In: 324.1 [P.O.:240; IV Piggyback:84.1] Out: 125 [Urine:125] Intake/Output this shift: Total I/O In: 240 [P.O.:240] Out: -   More pain limited exam with left quad weakness.  Lab Results: Recent Labs    09/22/21 0610  WBC 11.7*  HGB 16.3  HCT 46.1  PLT 166   BMET Recent Labs    09/22/21 0610  NA 139  K 2.9*  CL 107  CO2 21*  GLUCOSE 158*  BUN 23  CREATININE 1.15  CALCIUM 8.9    Studies/Results: MR Lumbar Spine W Wo Contrast  Result Date: 09/22/2021 CLINICAL DATA:  Bone neoplasm suspected. History of prostate cancer. Bilateral lower extremity weakness with difficulty walking. EXAM: MRI LUMBAR SPINE WITHOUT AND WITH CONTRAST TECHNIQUE: Multiplanar and multiecho pulse sequences of the lumbar spine were obtained without and with intravenous contrast. CONTRAST:  22mL GADAVIST GADOBUTROL 1 MMOL/ML IV SOLN COMPARISON:  CT abdomen 07/16/2021 FINDINGS: Segmentation:  5 lumbar type vertebral bodies. Alignment: Mild scoliotic curvature convex to the right. One or 2 mm of degenerative anterolisthesis at L4-5. Vertebrae: No regional fracture. Benign appearing lipoma or hemangioma within the T12 vertebral body. Second benign appearing hemangioma within the L5 vertebral body. Discogenic endplate marrow changes throughout the region, with mild edema and enhancement at the L1-2 and L2-3 level. Conus medullaris and cauda equina: Conus extends to the L1 level. Conus and cauda  equina appear normal. Paraspinal and other soft tissues: Negative Disc levels: T12-L1: Mild noncompressive disc bulge. L1-2: Endplate osteophytes and bulging of the disc. Mild facet and ligamentous hypertrophy. Moderate multifactorial stenosis at this level with crowding of the nerve roots. L2-3: Endplate osteophytes and bulging of the disc. Left posterolateral disc herniation with a 9 mm fragment migrated caudally in the left lateral recess which could focally compress the left L3 nerve. Mild stenosis of the right lateral recess. L3-4: Left posterolateral to foraminal disc herniation. Stenosis of both lateral recesses. Left foraminal stenosis on the left that could compress the exiting L3 nerve. L4-5: Chronic facet arthropathy with 1 or 2 mm of anterolisthesis. Mild bulging of the disc. Moderate stenosis of the canal, most severe in the lateral recesses. Neural foraminal stenosis right worse than left. Neural compression could occur on either side at this level. L5-S1: Endplate osteophytes and mild bulging of the disc. Facet degeneration and hypertrophy. Mild narrowing of the subarticular lateral recesses and neural foramina but no likely neural compression. IMPRESSION: No sign of metastatic disease to bone. Benign appearing hemangiomas within the T12 and L5 vertebral bodies. Discogenic endplate marrow changes, including mild edema and enhancement at the L1-2 and L2-3 levels. L1-2: Moderate multifactorial stenosis with crowding of the nerve roots could be symptomatic. L2-3: Stenosis of both lateral recesses could cause neural compression. 9 mm caudally migrated disc herniation in the left lateral recess could focally affect the left L3 nerve. L3-4: Circumferential disc protrusion with a left foraminal to extraforaminal herniation. Stenosis both lateral recesses could cause neural compression. Left foraminal stenosis  likely to compress the L3 nerve. L4-5: Chronic facet arthropathy with 1 or 2 mm of anterolisthesis.  Bulging of the disc. Moderate canal stenosis, most severe in the lateral recesses, could cause neural compression on either or both sides. Foraminal stenosis worse on the right could cause neural compression. L5-S1: Disc bulge. Facet degeneration and hypertrophy. No compressive stenosis. Electronically Signed   By: Nelson Chimes M.D.   On: 09/22/2021 11:58   DG Hip Unilat W or Wo Pelvis 2-3 Views Left  Result Date: 09/22/2021 CLINICAL DATA:  Pain, difficulty walking EXAM: DG HIP (WITH OR WITHOUT PELVIS) 2-3V LEFT COMPARISON:  None. FINDINGS: There is evidence of previous brachytherapy with numerous metallic densities in the region of prostate. No recent fracture or dislocation is seen. Small bony spurs seen in the lateral aspect of left hip. There are no focal lytic or sclerotic lesions. Degenerative changes are noted in the visualized lower lumbar spine. IMPRESSION: No fracture or dislocation is seen. There are no focal lytic or sclerotic lesions. Degenerative changes are noted with small bony spurs in the lateral aspect of left hip. Degenerative changes are noted in the visualized lower lumbar spine. Electronically Signed   By: Elmer Picker M.D.   On: 09/22/2021 10:20    Assessment/Plan: 75 year old gentleman now primarily left L3 radicular symptoms does not appear to have any pain referable to his stenosis.  So we are planning on proceeding forward with a left L2-3 microdiscectomy.  I extensively went over the risks and benefits of that operation with him as well as perioperative course expectations of outcome and alternatives of surgery and he understands and agrees to proceed forward.  I do not think we need to address his stenosis at this point in part I do not think it symptomatic #2 dressing stenosis would probably necessitate a lumbar fusion he does not want to have screws and rods put in his back at this time so extensively gone over the risks and benefits of leaving that alone the fact that  it might have to take care of in the future but at this point I do not think it symptomatic and will continue to proceed forward with L2-3 microdiscectomy and monitor his recovery.  LOS: 0 days     Elaina Hoops 09/23/2021, 4:15 PM

## 2021-09-23 NOTE — Evaluation (Signed)
Physical Therapy Evaluation Patient Details Name: Sean [name] MRN: [id number] DOB: 1946/02/13 Today's Date: 09/23/2021  History of Present Illness  75 y.o. male admitted on 09/22/21 for back and left leg pain.  Neurosurgery following.  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  Attempting conservative management.  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  Clinical Impression  Pt only able to stand and pivot to the recliner due to left leg pain and weakness.  He would like to try again this afternoon.  He is hopeful to get well enough to go home with his brother, but his brother can only provide supervision as his brother is an amputee.   PT to follow acutely for deficits listed below.        Recommendations for follow up therapy are one component of a multi-disciplinary discharge planning process, led by the attending physician.  Recommendations may be updated based on patient status, additional functional criteria and insurance authorization.  Follow Up Recommendations Skilled nursing-short term rehab (<3 hours/day)    Assistance Recommended at Discharge Intermittent Supervision/Assistance  Functional Status Assessment Patient has had a recent decline in their functional status and demonstrates the ability to make significant improvements in function in a reasonable and predictable amount of time.  Equipment Recommendations  Rolling walker (2 wheels);3in1 (PT)    Recommendations for Other Services OT consult     Precautions / Restrictions Precautions Precautions: Fall Precaution Comments: left leg weak and painful, recent slide down to floor from bed      Mobility  Bed Mobility Overal bed mobility: Needs Assistance Bed Mobility: Rolling;Sidelying to Sit Rolling: Supervision Sidelying to sit: Supervision       General bed  mobility comments: supervision for safety, extra time needed to complete.  Pt came out of left side of bed with bed flat simulating the side he goes to at home.    Transfers Overall transfer level: Needs assistance Equipment used: Rolling walker (2 wheels) Transfers: Stand Pivot Transfers;Sit to/from Stand Sit to Stand: Min assist;From elevated surface Stand pivot transfers: Min assist;From elevated surface         General transfer comment: Min assist to stand and pivot from bed to recliner char.  Pt did not feel he could walk at this time given the radicular pain into his left leg and weakness and would like to try again after he takes pain meds (he wants to wait to take pain meds after he eats lunch).    Ambulation/Gait                Stairs            Wheelchair Mobility    Modified Rankin (Stroke Patients Only)       Balance Overall balance assessment: Needs assistance Sitting-balance support: Feet supported;Bilateral upper extremity supported Sitting balance-Leahy Scale: Fair     Standing balance support: Bilateral upper extremity supported Standing balance-Leahy Scale: Poor Standing balance comment: needed external support from RW and therapist                             Pertinent Vitals/Pain Pain Assessment: Faces Faces Pain Scale: Hurts even more Pain Location: back and left leg Pain Descriptors / Indicators: Grimacing;Guarding Pain Intervention(s): Limited activity within patient's tolerance;Monitored during session;Repositioned;Other (comment) (per RN he has take no  pain meds, pt wants to eat lunch first, then take pain meds and try again with PT)    Home Living Family/patient expects to be discharged to:: Private residence Living Arrangements: Alone Available Help at Discharge: Family;Other (Comment) (brother-plans to go to stay with him as he has a more accessible home, brother is an amputee and can only provide supervision) Type  of Home: House (brother's) Home Access: Ramped entrance       Home Layout: One level Home Equipment: [place] - single point      Prior Function Prior Level of Function : Independent/Modified Independent             Mobility Comments: pt was ambulatory with a cane, does drive, cooks, does very little cleaning over and above laundry       Hand Dominance   Dominant Hand: Right    Extremity/Trunk Assessment   Upper Extremity Assessment Upper Extremity Assessment: Defer to OT evaluation    Lower Extremity Assessment Lower Extremity Assessment: RLE deficits/detail;LLE deficits/detail RLE Deficits / Details: right leg with 3-/5 strength intact LT sensation LLE Deficits / Details: left leg with weaker 3-/5 strength, radicular pain in standing down left leg/thigh and functional weakness/buckling.    Cervical / Trunk Assessment Cervical / Trunk Assessment: Other exceptions Cervical / Trunk Exceptions: large body habitus in his abdominal region, pt reports he has lost 50 lbs (on purpose) in the last 6 mo  Communication   Communication: No difficulties  Cognition Arousal/Alertness: Awake/alert Behavior During Therapy: WFL for tasks assessed/performed Overall Cognitive Status: Within Functional Limits for tasks assessed                                          General Comments      Exercises     Assessment/Plan    PT Assessment Patient needs continued PT services  PT Problem List Decreased strength;Decreased activity tolerance;Decreased balance;Decreased mobility;Decreased knowledge of use of DME;Decreased knowledge of precautions;Impaired sensation;Pain;Obesity       PT Treatment Interventions DME instruction;Gait training;Stair training;Functional mobility training;Therapeutic activities;Therapeutic exercise;Balance training;Neuromuscular re-education;Patient/family education;Modalities    PT Goals (Current goals can be found in the Care Plan section)   Acute Rehab PT Goals Patient Stated Goal: to get well enough to go home with his brother for a few weeks PT Goal Formulation: With patient Time For Goal Achievement: 10/07/21 Potential to Achieve Goals: Good    Frequency Min 3X/week   Barriers to discharge        Co-evaluation               AM-PAC PT "6 Clicks" Mobility  Outcome Measure Help needed turning from your back to your side while in a flat bed without using bedrails?: A Little Help needed moving from lying on your back to sitting on the side of a flat bed without using bedrails?: A Little Help needed moving to and from a bed to a chair (including a wheelchair)?: A Little Help needed standing up from a chair using your arms (e.g., wheelchair or bedside chair)?: A Little Help needed to walk in hospital room?: A Lot Help needed climbing 3-5 steps with a railing? : Total 6 Click Score: 15    End of Session Equipment Utilized During Treatment: Gait belt Activity Tolerance: Patient limited by pain Patient left: in chair Nurse Communication: Mobility status PT Visit Diagnosis: Muscle weakness (generalized) (M62.81);Difficulty in  walking, not elsewhere classified (R26.2);Pain Pain - Right/Left: Left Pain - part of body: Leg    Time: 0254-2706 PT Time Calculation (min) (ACUTE ONLY): 21 min   Charges:   PT Evaluation $PT Eval Moderate Complexity: 1 Mod         Verdene Lennert, PT, DPT  Acute Rehabilitation Ortho Tech Supervisor [phone] pager [phone]) [phone] office

## 2021-09-23 NOTE — Progress Notes (Signed)
Subjective: Patient reports  significant provement in pain numbness tingling and strength.  He was able to ambulate in the room back and forth to the bathroom last night when he got here to put a condom cath on secondary to some of his difficulties getting in and out of bed overnight patient does feel like he can control his bladder.  No postvoid residual has been checked as of yet  Objective: Vital signs in last 24 hours: Temp:  [98.1 F (36.7 C)-99.1 F (37.3 C)] 98.1 F (36.7 C) (11/03 0342) Pulse Rate:  [64-91] 72 (11/03 0342) Resp:  [14-20] 19 (11/03 0342) BP: (102-172)/(72-110) 128/87 (11/03 0342) SpO2:  [90 %-96 %] 90 % (11/03 0342) Weight:  [315.1 kg] 116.2 kg (11/02 1928)  Intake/Output from previous day: 11/02 0701 - 11/03 0700 In: 324.1 [P.O.:240; IV Piggyback:84.1] Out: 125 [Urine:125] Intake/Output this shift: No intake/output data recorded.  Strength significantly proved 5-5 iliopsoas 4+ out of 5 quadricep strength with much less pain today  Lab Results: Recent Labs    09/22/21 0610  WBC 11.7*  HGB 16.3  HCT 46.1  PLT 166   BMET Recent Labs    09/22/21 0610  NA 139  K 2.9*  CL 107  CO2 21*  GLUCOSE 158*  BUN 23  CREATININE 1.15  CALCIUM 8.9    Studies/Results: MR Lumbar Spine W Wo Contrast  Result Date: 09/22/2021 CLINICAL DATA:  Bone neoplasm suspected. History of prostate cancer. Bilateral lower extremity weakness with difficulty walking. EXAM: MRI LUMBAR SPINE WITHOUT AND WITH CONTRAST TECHNIQUE: Multiplanar and multiecho pulse sequences of the lumbar spine were obtained without and with intravenous contrast. CONTRAST:  62mL GADAVIST GADOBUTROL 1 MMOL/ML IV SOLN COMPARISON:  CT abdomen 07/16/2021 FINDINGS: Segmentation:  5 lumbar type vertebral bodies. Alignment: Mild scoliotic curvature convex to the right. One or 2 mm of degenerative anterolisthesis at L4-5. Vertebrae: No regional fracture. Benign appearing lipoma or hemangioma within the T12  vertebral body. Second benign appearing hemangioma within the L5 vertebral body. Discogenic endplate marrow changes throughout the region, with mild edema and enhancement at the L1-2 and L2-3 level. Conus medullaris and cauda equina: Conus extends to the L1 level. Conus and cauda equina appear normal. Paraspinal and other soft tissues: Negative Disc levels: T12-L1: Mild noncompressive disc bulge. L1-2: Endplate osteophytes and bulging of the disc. Mild facet and ligamentous hypertrophy. Moderate multifactorial stenosis at this level with crowding of the nerve roots. L2-3: Endplate osteophytes and bulging of the disc. Left posterolateral disc herniation with a 9 mm fragment migrated caudally in the left lateral recess which could focally compress the left L3 nerve. Mild stenosis of the right lateral recess. L3-4: Left posterolateral to foraminal disc herniation. Stenosis of both lateral recesses. Left foraminal stenosis on the left that could compress the exiting L3 nerve. L4-5: Chronic facet arthropathy with 1 or 2 mm of anterolisthesis. Mild bulging of the disc. Moderate stenosis of the canal, most severe in the lateral recesses. Neural foraminal stenosis right worse than left. Neural compression could occur on either side at this level. L5-S1: Endplate osteophytes and mild bulging of the disc. Facet degeneration and hypertrophy. Mild narrowing of the subarticular lateral recesses and neural foramina but no likely neural compression. IMPRESSION: No sign of metastatic disease to bone. Benign appearing hemangiomas within the T12 and L5 vertebral bodies. Discogenic endplate marrow changes, including mild edema and enhancement at the L1-2 and L2-3 levels. L1-2: Moderate multifactorial stenosis with crowding of the nerve roots could be  symptomatic. L2-3: Stenosis of both lateral recesses could cause neural compression. 9 mm caudally migrated disc herniation in the left lateral recess could focally affect the left L3  nerve. L3-4: Circumferential disc protrusion with a left foraminal to extraforaminal herniation. Stenosis both lateral recesses could cause neural compression. Left foraminal stenosis likely to compress the L3 nerve. L4-5: Chronic facet arthropathy with 1 or 2 mm of anterolisthesis. Bulging of the disc. Moderate canal stenosis, most severe in the lateral recesses, could cause neural compression on either or both sides. Foraminal stenosis worse on the right could cause neural compression. L5-S1: Disc bulge. Facet degeneration and hypertrophy. No compressive stenosis. Electronically Signed   By: Nelson Chimes M.D.   On: 09/22/2021 11:58   DG Hip Unilat W or Wo Pelvis 2-3 Views Left  Result Date: 09/22/2021 CLINICAL DATA:  Pain, difficulty walking EXAM: DG HIP (WITH OR WITHOUT PELVIS) 2-3V LEFT COMPARISON:  None. FINDINGS: There is evidence of previous brachytherapy with numerous metallic densities in the region of prostate. No recent fracture or dislocation is seen. Small bony spurs seen in the lateral aspect of left hip. There are no focal lytic or sclerotic lesions. Degenerative changes are noted in the visualized lower lumbar spine. IMPRESSION: No fracture or dislocation is seen. There are no focal lytic or sclerotic lesions. Degenerative changes are noted with small bony spurs in the lateral aspect of left hip. Degenerative changes are noted in the visualized lower lumbar spine. Electronically Signed   By: Elmer Picker M.D.   On: 09/22/2021 10:20    Assessment/Plan: Postop day 1 patient significantly improved this morning appears to have intact bladder function although we will check a postvoid residual later today we will work with physical therapy continues IV steroids and mobilize it is possible we might be able to defer surgery as the patient showing significant clinical signs of improvement.  LOS: 0 days     Elaina Hoops 09/23/2021, 8:03 AM

## 2021-09-23 NOTE — Progress Notes (Signed)
09/23/2021 saw pt again this PM and he was able to walk to the door of his room and back, but just barely before collapsing down on the bed reporting significant L leg pain (radicular) and weakness.  PT will continue to follow acutely for safe mobility progression  Verdene Lennert, PT, DPT  Acute Rehabilitation Ortho Tech Supervisor [phone] pager [phone] office       09/23/21 1454  PT Visit Information  Last PT Received On 09/23/21  Assistance Needed +1  History of Present Illness 75 y.o. male admitted on 09/22/21 for back and left leg pain.  Neurosurgery following.  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  Attempting conservative management.  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  Subjective Data  Patient Stated Goal to get well enough to go home with his brother for a few weeks and has decided he should likely have the surgery  Precautions  Precautions Back;Fall  Precaution Booklet Issued No  Precaution Comments back for comfort, L leg weak and painful  Pain Assessment  Pain Assessment Faces  Faces Pain Scale 6  Pain Location back and left leg  Pain Descriptors / Indicators Guarding  Pain Intervention(s) Limited activity within patient's tolerance;Monitored during session;Premedicated before session;Repositioned  Cognition  Arousal/Alertness Awake/alert  Behavior During Therapy WFL for tasks assessed/performed  Overall Cognitive Status Within Functional Limits for tasks assessed  Bed Mobility  Overal bed mobility Needs Assistance  Bed Mobility Sit to Sidelying  Sit to sidelying Mod assist  General bed mobility comments Mod assist to help lift both legs back into bed from sitting.  Transfers  Overall transfer level Needs assistance  Equipment used Rolling walker (2 wheels)  Transfers Sit to/from Stand  Sit to Stand Min  guard  General transfer comment Min guard for safety, multiple rock attempts before successful.  Ambulation/Gait  Ambulation/Gait assistance Min assist  Gait Distance (Feet) 20 Feet  Assistive device Rolling walker (2 wheels)  Gait Pattern/deviations Step-through pattern;Shuffle;Antalgic  General Gait Details Pt favoring his left leg, reporting pain with WB on it, weakness in stance and limited tolerance of gait (made it to the door in his room and back to the bed) and barely made it back before he collapsed onto the bed.  Gait velocity decreased  Gait velocity interpretation <1.8 ft/sec, indicate of risk for recurrent falls  Balance  Overall balance assessment Needs assistance  Sitting-balance support Feet supported;Bilateral upper extremity supported  Sitting balance-Leahy Scale Good  Standing balance support Bilateral upper extremity supported  Standing balance-Leahy Scale Poor  Standing balance comment needed external support from RW and therapist  General Comments  General comments (skin integrity, edema, etc.) positioned in side lying for comfort with pillows for support.  PT - End of Session  Equipment Utilized During Treatment Gait belt  Activity Tolerance Patient limited by pain  Patient left in bed;with call bell/phone within reach   PT - Assessment/Plan  PT Plan Current plan remains appropriate  PT Visit Diagnosis Muscle weakness (generalized) (M62.81);Difficulty in walking, not elsewhere classified (R26.2);Pain  Pain - Right/Left Left  Pain - part of body Leg  PT Frequency (ACUTE ONLY) Min 3X/week  Recommendations for Other Services OT consult  Follow Up Recommendations Skilled nursing-short term rehab (<3 hours/day)  Assistance recommended at discharge Intermittent Supervision/Assistance  PT equipment Rolling walker (2 wheels);3in1 (PT)  AM-PAC PT "6  Clicks" Mobility Outcome Measure (Version 2)  Help needed turning from your back to your side while in a flat bed without  using bedrails? 3  Help needed moving from lying on your back to sitting on the side of a flat bed without using bedrails? 3  Help needed moving to and from a bed to a chair (including a wheelchair)? 3  Help needed standing up from a chair using your arms (e.g., wheelchair or bedside chair)? 3  Help needed to walk in hospital room? 2  Help needed climbing 3-5 steps with a railing?  1  6 Click Score 15  Consider Recommendation of Discharge To: CIR/SNF/LTACH  Progressive Mobility  What is the highest level of mobility based on the progressive mobility assessment? Level 4 (Walks with assist in room) - Balance while marching in place and cannot step forward and back - Complete  Mobility Ambulated with assistance in room;Out of bed to chair with meals;Out of bed for toileting  PT Goal Progression  Progress towards PT goals Progressing toward goals  PT Time Calculation  PT Start Time (ACUTE ONLY) 1416  PT Stop Time (ACUTE ONLY) 1450  PT Time Calculation (min) (ACUTE ONLY) 34 min  PT General Charges  $$ ACUTE PT VISIT 1 Visit  PT Treatments  $Gait Training 8-22 mins  $Therapeutic Activity 8-22 mins

## 2021-09-23 NOTE — Evaluation (Signed)
Occupational Therapy Evaluation Patient Details Name: Sean [name] MRN: [id number] DOB: [birth year] Today's Date: 09/23/2021   History of Present Illness 75 y.o. male admitted on 09/22/21 for back and left leg pain.  Neurosurgery following.  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  Attempting conservative management.  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   Clinical Impression   Patient admitted for the above diagnosis.  PTA he lives at home, alone, and was generally Mod I with mobility and ADL/IADL completion.  The patient admits to a recent fall, and increasing back pain impacting independence.  Deficits are listed below.  Currently he is needing up to [place] for lower body ADL from a seated position, and was able to walk to the toilet with supervision, but he has limited activity tolerance, and increasing radicular pain to the left leg reducing activity tolerance.  OT will follow in the acute setting, and if the patient proceeds with the lumbar surgery, OT will reassess in order to help with discharge planning.       Recommendations for follow up therapy are one component of a multi-disciplinary discharge planning process, led by the attending physician.  Recommendations may be updated based on patient status, additional functional criteria and insurance authorization.   Follow Up Recommendations  Follow physician's recommendations for discharge plan and follow up therapies    Assistance Recommended at Discharge Intermittent Supervision/Assistance  Functional Status Assessment  Patient has had a recent decline in their functional status and demonstrates the ability to make significant improvements in function in a reasonable and predictable amount of time.  Equipment Recommendations  BSC;Tub/shower bench    Recommendations for Other  Services       Precautions / Restrictions Precautions Precautions: Fall Precaution Comments: left leg weak and painful Restrictions Weight Bearing Restrictions: No      Mobility Bed Mobility Overal bed mobility: Needs Assistance Bed Mobility: Rolling;Sidelying to Sit Rolling: Supervision Sidelying to sit: Supervision       General bed mobility comments: up in recliner    Transfers Overall transfer level: Needs assistance Equipment used: Rolling walker (2 wheels) Transfers: Sit to/from Stand Sit to Stand: Supervision Stand pivot transfers: Min assist;From elevated surface         General transfer comment: Min assist to stand and pivot from bed to recliner char.  Pt did not feel he could walk at this time given the radicular pain into his left leg and weakness and would like to try again after he takes pain meds (he wants to wait to take pain meds after he eats lunch).      Balance Overall balance assessment: Needs assistance Sitting-balance support: Feet supported Sitting balance-Leahy Scale: Good     Standing balance support: Bilateral upper extremity supported;Reliant on assistive device for balance Standing balance-Leahy Scale: Poor Standing balance comment: needed external support from RW and therapist                           ADL either performed or assessed with clinical judgement   ADL       Grooming: Wash/dry hands;Min guard;Standing Grooming Details (indicate cue type and reason): leaning onto counter     Lower Body Bathing: Min guard;Sit to/from stand       Lower Body Dressing: Min guard;Sit to/from stand Lower Body Dressing  Details (indicate cue type and reason): increase effort and SOB with minimal acitivity Toilet Transfer: Supervision/safety;Comfort height toilet;Ambulation   Toileting- Clothing Manipulation and Hygiene: Min guard;Sit to/from stand Toileting - Clothing Manipulation Details (indicate cue type and reason):  initially setup seted, but Min guard with standing due to SOB             Vision Patient Visual Report: No change from baseline       Perception Perception Perception: Not tested   Praxis Praxis Praxis: Not tested    Pertinent Vitals/Pain Pain Assessment: Faces Faces Pain Scale: Hurts little more Pain Location: back and left leg Pain Descriptors / Indicators: Guarding Pain Intervention(s): Monitored during session;Premedicated before session     Hand Dominance Right   Extremity/Trunk Assessment Upper Extremity Assessment Upper Extremity Assessment: Overall WFL for tasks assessed   Lower Extremity Assessment Lower Extremity Assessment: Defer to PT evaluation    Cervical / Trunk Assessment Cervical / Trunk Assessment: Other exceptions Cervical / Trunk Exceptions: body habitus   Communication Communication Communication: No difficulties   Cognition Arousal/Alertness: Awake/alert Behavior During Therapy: WFL for tasks assessed/performed Overall Cognitive Status: Within Functional Limits for tasks assessed                                       General Comments   none    Exercises     Shoulder Instructions      Home Living Family/patient expects to be discharged to:: Private residence Living Arrangements: Alone Available Help at Discharge: Family;Other (Comment) Type of Home: House Home Access: Ramped entrance     Home Layout: One level     Bathroom Shower/Tub: Tub/shower unit;Walk-in shower   Bathroom Toilet: Standard Bathroom Accessibility: Yes How Accessible: Accessible via walker Home Equipment: [place] - single point   Additional Comments: Above information is for his brother's home.  The patient lives in a split level - his plan is to d/c to his brother's home for a short period of time.      Prior Functioning/Environment Prior Level of Function : Independent/Modified Independent             Mobility Comments: pt was  ambulatory with a cane, does drive, cooks, does very little cleaning over and above laundry ADLs Comments: Mod I with ADL completion with increased time and effort        OT Problem List: Decreased activity tolerance;Impaired balance (sitting and/or standing);Decreased knowledge of precautions;Pain      OT Treatment/Interventions: Self-care/ADL training;Therapeutic activities;Energy conservation;DME and/or AE instruction;Patient/family education;Balance training    OT Goals(Current goals can be found in the care plan section) Acute Rehab OT Goals Patient Stated Goal: I think I'm going to do the surgery OT Goal Formulation: With patient Time For Goal Achievement: 10/07/21 Potential to Achieve Goals: Good ADL Goals Pt Will Perform Grooming: with modified independence;standing Pt Will Perform Lower Body Bathing: with modified independence;sit to/from stand Pt Will Perform Lower Body Dressing: with modified independence;sit to/from stand Pt Will Transfer to Toilet: with modified independence;ambulating;regular height toilet Pt Will Perform Toileting - Clothing Manipulation and hygiene: with modified independence;sit to/from stand Pt Will Perform Tub/Shower Transfer: with modified independence;Stand pivot transfer;tub bench  OT Frequency: Min 2X/week   Barriers to D/C:    none noted       Co-evaluation              AM-PAC OT "6  Clicks" Daily Activity     Outcome Measure Help from another person eating meals?: None Help from another person taking care of personal grooming?: A Little Help from another person toileting, which includes using toliet, bedpan, or urinal?: A Little Help from another person bathing (including washing, rinsing, drying)?: A Little Help from another person to put on and taking off regular upper body clothing?: None Help from another person to put on and taking off regular lower body clothing?: A Little 6 Click Score: 20   End of Session Equipment  Utilized During Treatment: Rolling walker (2 wheels)  Activity Tolerance: Patient limited by fatigue;Patient limited by pain Patient left: in chair;with call bell/phone within reach  OT Visit Diagnosis: Unsteadiness on feet (R26.81);Pain Pain - Right/Left: Left Pain - part of body: Leg                Time: 9234-1443 OT Time Calculation (min): 24 min Charges:  OT General Charges $OT Visit: 1 Visit OT Evaluation $OT Eval Moderate Complexity: 1 Mod OT Treatments $Self Care/Home Management : 8-22 mins  09/23/2021  RP, OTR/L  Acute Rehabilitation Services  Office:  [phone]   Metta Clines 09/23/2021, 2:53 PM

## 2021-09-24 ENCOUNTER — Inpatient Hospital Stay (HOSPITAL_COMMUNITY): Payer: Medicare Other

## 2021-09-24 ENCOUNTER — Inpatient Hospital Stay (HOSPITAL_COMMUNITY): Payer: Medicare Other | Admitting: Anesthesiology

## 2021-09-24 ENCOUNTER — Encounter (HOSPITAL_COMMUNITY)
Admission: EM | Disposition: A | Discharge status: Skilled Nursing Facility | Payer: Self-pay | Source: Home / Self Care | Attending: Neurosurgery

## 2021-09-24 DIAGNOSIS — M5126 Other intervertebral disc displacement, lumbar region: Secondary | ICD-10-CM | POA: Diagnosis present

## 2021-09-24 HISTORY — PX: LUMBAR LAMINECTOMY/DECOMPRESSION MICRODISCECTOMY: SHX5026

## 2021-09-24 IMAGING — CR DG LUMBAR SPINE 2-3V
2 series · 2 of 2 positions shown · non-contrast
Comparison: None.

CLINICAL DATA: Back pain, localization for surgery

EXAM:
LUMBAR SPINE - 2-3 VIEW

[lateral (1 of 2)]
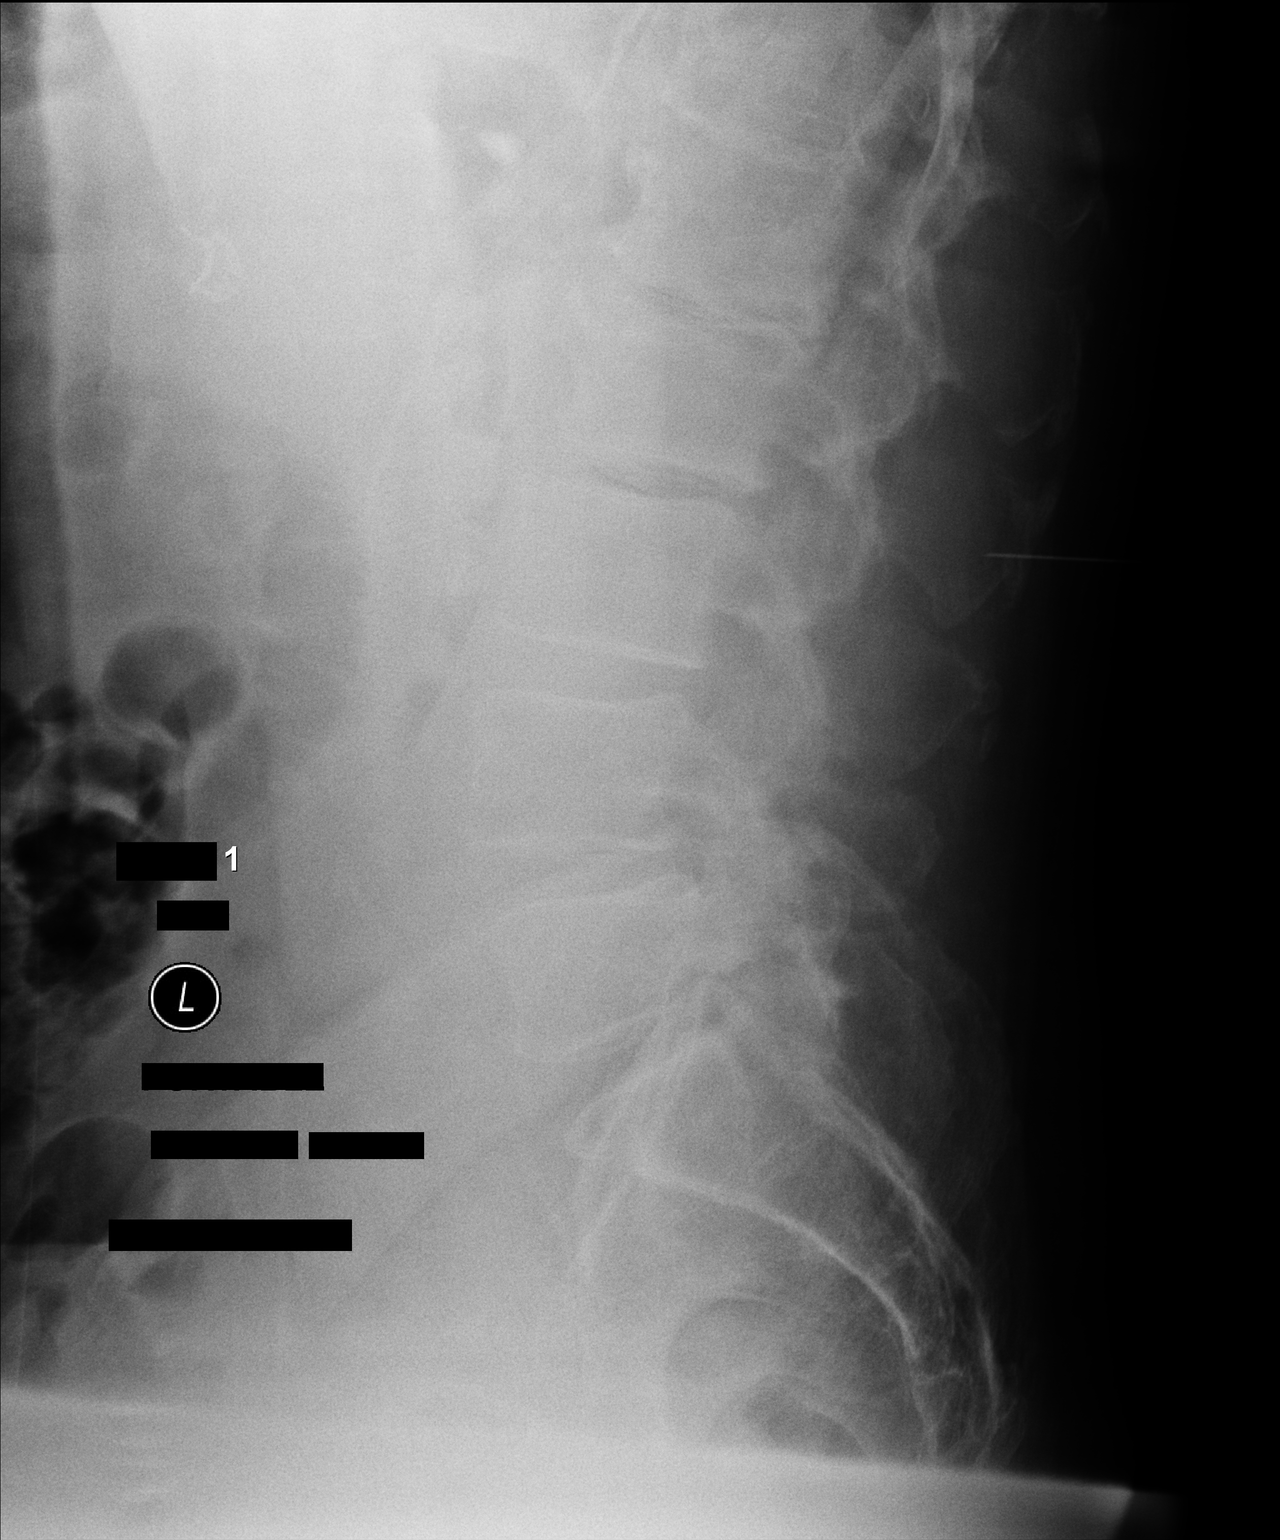

[lateral (2 of 2)]
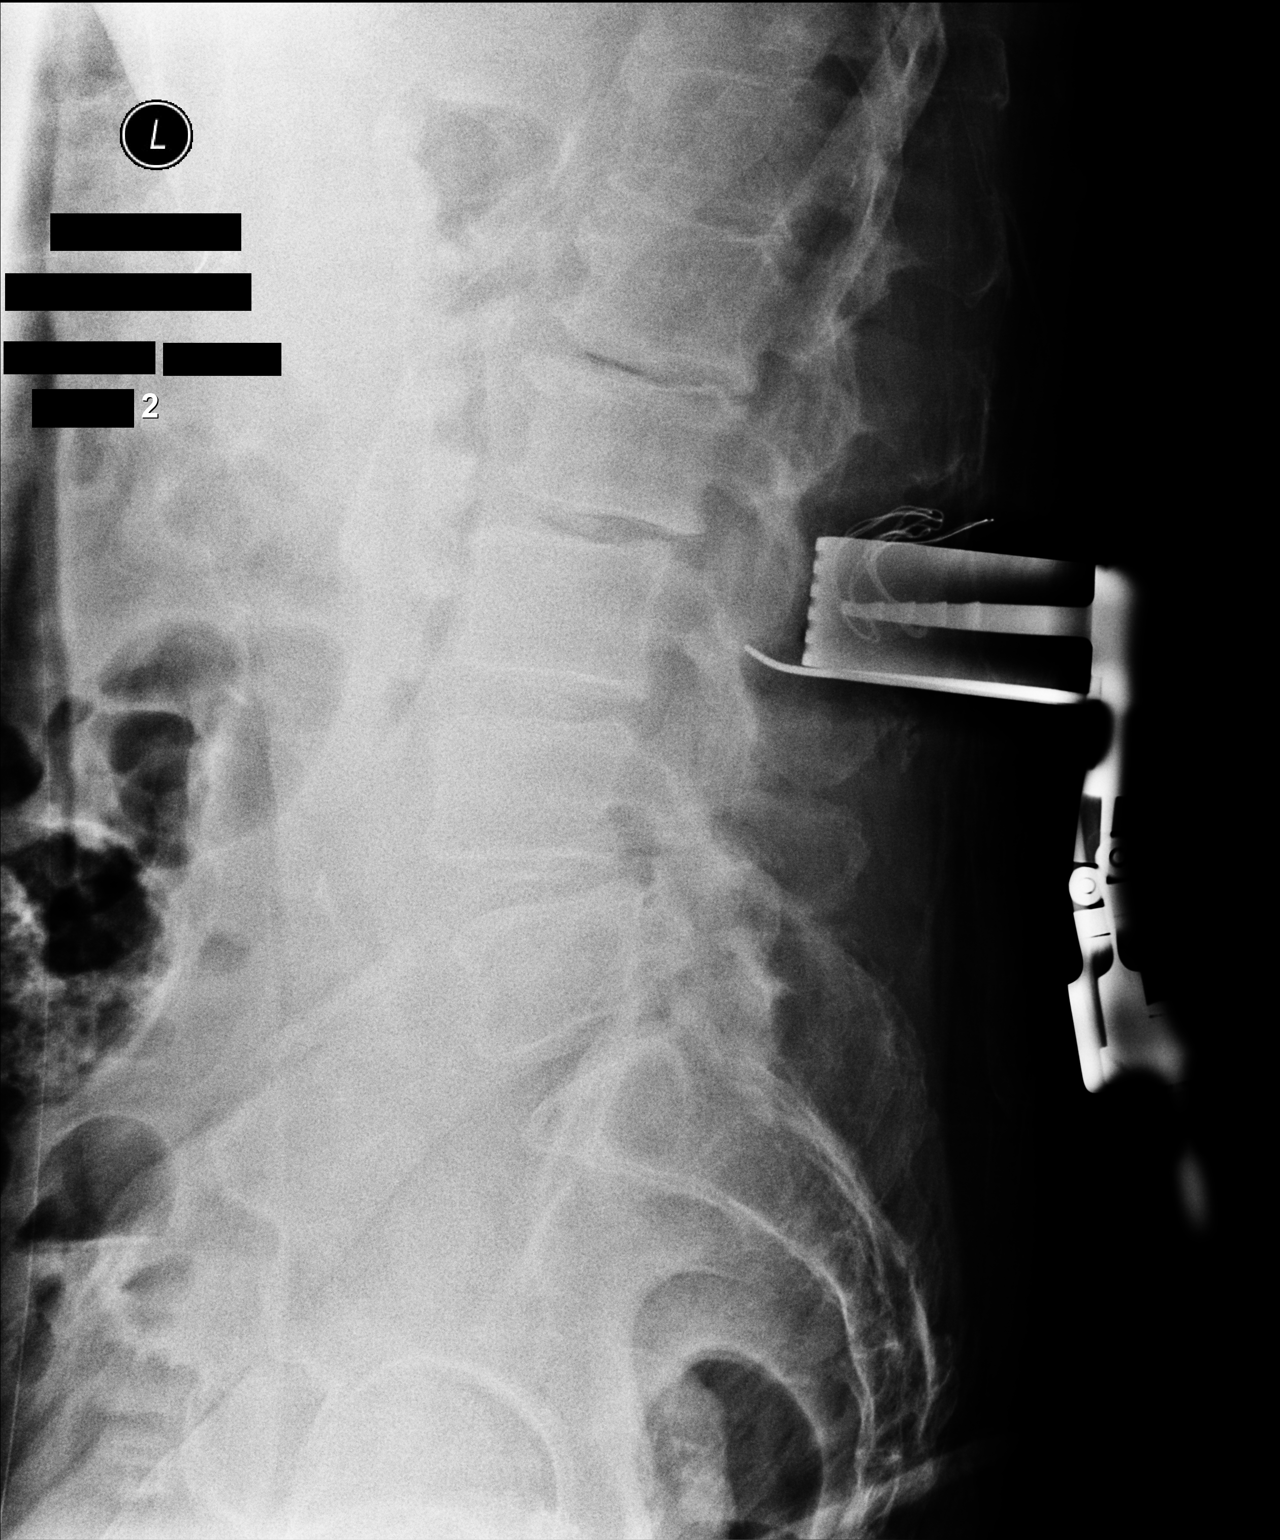

[2 of 2 positions shown; findings below may reference images not displayed]

FINDINGS: Portable lateral radiographs show localizing probe at L2-L3 level.
There is minimal retrolisthesis at L2-L3 level along with disc space
narrowing. Disc space narrowing and bony spurs are noted at other
levels in lumbar spine, more so at L1-L2 level.
IMPRESSION: Portable cross-table lateral views were done for localization during
surgery at L2-L3 level.

## 2021-09-24 SURGERY — LUMBAR LAMINECTOMY/DECOMPRESSION MICRODISCECTOMY 1 LEVEL
Anesthesia: General | Site: Spine Lumbar | Laterality: Left

## 2021-09-24 MED ORDER — ACETAMINOPHEN 650 MG RE SUPP: 650.0000 mg | RECTAL | Status: DC | PRN

## 2021-09-24 MED ORDER — HYDROMORPHONE HCL 1 MG/ML IJ SOLN: 0.5000 mg | INTRAMUSCULAR | Status: DC | PRN

## 2021-09-24 MED ORDER — MIDAZOLAM HCL 5 MG/5ML IJ SOLN
INTRAMUSCULAR | Status: DC | PRN
  Administered 2021-09-24 (×2): 1 mg via INTRAVENOUS

## 2021-09-24 MED ORDER — 0.9 % SODIUM CHLORIDE (POUR BTL) OPTIME
TOPICAL | Status: DC | PRN
  Administered 2021-09-24: 1000 mL

## 2021-09-24 MED ORDER — LIDOCAINE-EPINEPHRINE 1 %-1:100000 IJ SOLN
INTRAMUSCULAR | Status: DC | PRN
  Administered 2021-09-24: 9 mL

## 2021-09-24 MED ORDER — BUPIVACAINE HCL (PF) 0.25 % IJ SOLN
INTRAMUSCULAR | Status: DC | PRN
  Administered 2021-09-24: 10 mL

## 2021-09-24 MED ORDER — AMISULPRIDE (ANTIEMETIC) 5 MG/2ML IV SOLN: 10.0000 mg | Freq: Once | INTRAVENOUS | Status: DC | PRN

## 2021-09-24 MED ORDER — THROMBIN 5000 UNITS EX SOLR
CUTANEOUS | Status: AC
  Filled 2021-09-24: qty 10000

## 2021-09-24 MED ORDER — DEXAMETHASONE SODIUM PHOSPHATE 10 MG/ML IJ SOLN
INTRAMUSCULAR | Status: AC
  Filled 2021-09-24: qty 1

## 2021-09-24 MED ORDER — THROMBIN 5000 UNITS EX SOLR
CUTANEOUS | Status: DC | PRN
  Administered 2021-09-24 (×2): 5000 [IU] via TOPICAL

## 2021-09-24 MED ORDER — MIDAZOLAM HCL 2 MG/2ML IJ SOLN
INTRAMUSCULAR | Status: AC
  Filled 2021-09-24: qty 2

## 2021-09-24 MED ORDER — OXYCODONE HCL 5 MG PO TABS: 5.0000 mg | ORAL_TABLET | Freq: Once | ORAL | Status: AC | PRN

## 2021-09-24 MED ORDER — PREDNISONE 5 MG (21) PO TBPK
5.0000 mg | ORAL_TABLET | Freq: Three times a day (TID) | ORAL | Status: AC
  Administered 2021-09-25 (×3): 5 mg via ORAL

## 2021-09-24 MED ORDER — CYCLOBENZAPRINE HCL 10 MG PO TABS
10.0000 mg | ORAL_TABLET | Freq: Three times a day (TID) | ORAL | Status: DC | PRN
  Administered 2021-09-27: 10 mg via ORAL
  Filled 2021-09-24 (×2): qty 1

## 2021-09-24 MED ORDER — OXYCODONE HCL 5 MG PO TABS
5.0000 mg | ORAL_TABLET | ORAL | Status: DC | PRN
  Administered 2021-09-24 – 2021-09-27 (×3): 5 mg via ORAL
  Filled 2021-09-24 (×3): qty 1

## 2021-09-24 MED ORDER — HEMOSTATIC AGENTS (NO CHARGE) OPTIME
TOPICAL | Status: DC | PRN
  Administered 2021-09-24: 1 via TOPICAL

## 2021-09-24 MED ORDER — PHENOL 1.4 % MT LIQD: 1.0000 | OROMUCOSAL | Status: DC | PRN

## 2021-09-24 MED ORDER — SUGAMMADEX SODIUM 500 MG/5ML IV SOLN
INTRAVENOUS | Status: AC
  Filled 2021-09-24: qty 5

## 2021-09-24 MED ORDER — DEXAMETHASONE SODIUM PHOSPHATE 10 MG/ML IJ SOLN
INTRAMUSCULAR | Status: DC | PRN
  Administered 2021-09-24: 4 mg via INTRAVENOUS

## 2021-09-24 MED ORDER — BUPIVACAINE HCL (PF) 0.25 % IJ SOLN
INTRAMUSCULAR | Status: AC
  Filled 2021-09-24: qty 30

## 2021-09-24 MED ORDER — OXYCODONE HCL 5 MG/5ML PO SOLN: 5.0000 mg | Freq: Once | ORAL | Status: AC | PRN

## 2021-09-24 MED ORDER — PROPOFOL 10 MG/ML IV BOLUS
INTRAVENOUS | Status: AC
  Filled 2021-09-24: qty 40

## 2021-09-24 MED ORDER — ONDANSETRON HCL 4 MG/2ML IJ SOLN: 4.0000 mg | Freq: Four times a day (QID) | INTRAMUSCULAR | Status: DC | PRN

## 2021-09-24 MED ORDER — FENTANYL CITRATE (PF) 250 MCG/5ML IJ SOLN
INTRAMUSCULAR | Status: AC
  Filled 2021-09-24: qty 5

## 2021-09-24 MED ORDER — POTASSIUM CHLORIDE 10 MEQ/100ML IV SOLN
INTRAVENOUS | Status: DC | PRN
  Administered 2021-09-24: 10 meq via INTRAVENOUS

## 2021-09-24 MED ORDER — ONDANSETRON HCL 4 MG/2ML IJ SOLN
INTRAMUSCULAR | Status: DC | PRN
  Administered 2021-09-24: 4 mg via INTRAVENOUS

## 2021-09-24 MED ORDER — PREDNISONE 5 MG (21) PO TBPK
5.0000 mg | ORAL_TABLET | Freq: Four times a day (QID) | ORAL | Status: DC
  Administered 2021-09-26 – 2021-09-28 (×8): 5 mg via ORAL

## 2021-09-24 MED ORDER — PHENYLEPHRINE 40 MCG/ML (10ML) SYRINGE FOR IV PUSH (FOR BLOOD PRESSURE SUPPORT)
PREFILLED_SYRINGE | INTRAVENOUS | Status: DC | PRN
  Administered 2021-09-24: 80 ug via INTRAVENOUS

## 2021-09-24 MED ORDER — PREDNISONE 5 MG (21) PO TBPK
10.0000 mg | ORAL_TABLET | Freq: Every morning | ORAL | Status: AC
  Administered 2021-09-24: 10 mg via ORAL
  Filled 2021-09-24: qty 21

## 2021-09-24 MED ORDER — METOPROLOL TARTRATE 5 MG/5ML IV SOLN
INTRAVENOUS | Status: DC | PRN
  Administered 2021-09-24: 1 mg via INTRAVENOUS

## 2021-09-24 MED ORDER — SODIUM CHLORIDE 0.9% FLUSH
3.0000 mL | Freq: Two times a day (BID) | INTRAVENOUS | Status: DC
  Administered 2021-09-24 – 2021-09-28 (×8): 3 mL via INTRAVENOUS

## 2021-09-24 MED ORDER — PROPOFOL 10 MG/ML IV BOLUS
INTRAVENOUS | Status: DC | PRN
  Administered 2021-09-24: 150 mg via INTRAVENOUS

## 2021-09-24 MED ORDER — LIDOCAINE 2% (20 MG/ML) 5 ML SYRINGE
INTRAMUSCULAR | Status: AC
  Filled 2021-09-24: qty 5

## 2021-09-24 MED ORDER — FENTANYL CITRATE (PF) 100 MCG/2ML IJ SOLN
INTRAMUSCULAR | Status: AC
  Administered 2021-09-24: 50 ug via INTRAVENOUS
  Filled 2021-09-24: qty 2

## 2021-09-24 MED ORDER — ROCURONIUM BROMIDE 10 MG/ML (PF) SYRINGE
PREFILLED_SYRINGE | INTRAVENOUS | Status: AC
  Filled 2021-09-24: qty 10

## 2021-09-24 MED ORDER — ONDANSETRON HCL 4 MG/2ML IJ SOLN: 4.0000 mg | Freq: Once | INTRAMUSCULAR | Status: DC | PRN

## 2021-09-24 MED ORDER — MENTHOL 3 MG MT LOZG: 1.0000 | LOZENGE | OROMUCOSAL | Status: DC | PRN

## 2021-09-24 MED ORDER — ROCURONIUM BROMIDE 10 MG/ML (PF) SYRINGE
PREFILLED_SYRINGE | INTRAVENOUS | Status: DC | PRN
  Administered 2021-09-24: 80 mg via INTRAVENOUS
  Administered 2021-09-24: 20 mg via INTRAVENOUS

## 2021-09-24 MED ORDER — DEXTROSE 5 % IV SOLN
INTRAVENOUS | Status: DC | PRN
  Administered 2021-09-24: 3 g via INTRAVENOUS

## 2021-09-24 MED ORDER — SODIUM CHLORIDE 0.9% FLUSH: 3.0000 mL | INTRAVENOUS | Status: DC | PRN

## 2021-09-24 MED ORDER — ACETAMINOPHEN 325 MG PO TABS: 650.0000 mg | ORAL_TABLET | ORAL | Status: DC | PRN

## 2021-09-24 MED ORDER — LIDOCAINE 2% (20 MG/ML) 5 ML SYRINGE
INTRAMUSCULAR | Status: DC | PRN
  Administered 2021-09-24: 100 mg via INTRAVENOUS

## 2021-09-24 MED ORDER — PREDNISONE 5 MG (21) PO TBPK
5.0000 mg | ORAL_TABLET | ORAL | Status: AC
  Administered 2021-09-24: 5 mg via ORAL
  Filled 2021-09-24: qty 21

## 2021-09-24 MED ORDER — PREDNISONE 5 MG (21) PO TBPK
10.0000 mg | ORAL_TABLET | Freq: Every evening | ORAL | Status: AC
  Administered 2021-09-24: 10 mg via ORAL

## 2021-09-24 MED ORDER — CEFAZOLIN SODIUM 1 G IJ SOLR
INTRAMUSCULAR | Status: AC
  Filled 2021-09-24: qty 30

## 2021-09-24 MED ORDER — SODIUM CHLORIDE 0.9 % IV SOLN: 250.0000 mL | INTRAVENOUS | Status: DC

## 2021-09-24 MED ORDER — OXYCODONE HCL 5 MG PO TABS
ORAL_TABLET | ORAL | Status: AC
  Administered 2021-09-24: 5 mg via ORAL
  Filled 2021-09-24: qty 1

## 2021-09-24 MED ORDER — FENTANYL CITRATE (PF) 250 MCG/5ML IJ SOLN
INTRAMUSCULAR | Status: DC | PRN
  Administered 2021-09-24 (×2): 25 ug via INTRAVENOUS

## 2021-09-24 MED ORDER — CEFAZOLIN SODIUM-DEXTROSE 2-4 GM/100ML-% IV SOLN
2.0000 g | Freq: Three times a day (TID) | INTRAVENOUS | Status: AC
  Administered 2021-09-24 (×2): 2 g via INTRAVENOUS
  Filled 2021-09-24 (×2): qty 100

## 2021-09-24 MED ORDER — LIDOCAINE-EPINEPHRINE 1 %-1:100000 IJ SOLN
INTRAMUSCULAR | Status: AC
  Filled 2021-09-24: qty 1

## 2021-09-24 MED ORDER — FENTANYL CITRATE (PF) 100 MCG/2ML IJ SOLN
25.0000 ug | INTRAMUSCULAR | Status: DC | PRN
  Administered 2021-09-24: 50 ug via INTRAVENOUS

## 2021-09-24 MED ORDER — ONDANSETRON HCL 4 MG PO TABS: 4.0000 mg | ORAL_TABLET | Freq: Four times a day (QID) | ORAL | Status: DC | PRN

## 2021-09-24 MED ORDER — PREDNISONE 5 MG (21) PO TBPK
10.0000 mg | ORAL_TABLET | Freq: Every evening | ORAL | Status: AC
  Administered 2021-09-25: 10 mg via ORAL

## 2021-09-24 MED ORDER — LACTATED RINGERS IV SOLN: INTRAVENOUS | Status: DC | PRN

## 2021-09-24 MED ORDER — ALUM & MAG HYDROXIDE-SIMETH 200-200-20 MG/5ML PO SUSP: 30.0000 mL | Freq: Four times a day (QID) | ORAL | Status: DC | PRN

## 2021-09-24 MED ORDER — ONDANSETRON HCL 4 MG/2ML IJ SOLN
INTRAMUSCULAR | Status: AC
  Filled 2021-09-24: qty 2

## 2021-09-24 MED ORDER — METOPROLOL TARTRATE 5 MG/5ML IV SOLN
INTRAVENOUS | Status: AC
  Filled 2021-09-24: qty 5

## 2021-09-24 MED ORDER — METOPROLOL SUCCINATE ER 50 MG PO TB24
75.0000 mg | ORAL_TABLET | Freq: Every morning | ORAL | Status: DC
  Administered 2021-09-24 – 2021-09-28 (×5): 75 mg via ORAL
  Filled 2021-09-24 (×5): qty 1

## 2021-09-24 MED ORDER — PREDNISONE 5 MG (21) PO TBPK: 5.0000 mg | ORAL_TABLET | ORAL | Status: AC

## 2021-09-24 MED ORDER — PANTOPRAZOLE SODIUM 40 MG IV SOLR
40.0000 mg | Freq: Every day | INTRAVENOUS | Status: DC
  Administered 2021-09-24: 40 mg via INTRAVENOUS
  Filled 2021-09-24: qty 40

## 2021-09-24 SURGICAL SUPPLY — 59 items
ADH SKN CLS APL DERMABOND .7 (GAUZE/BANDAGES/DRESSINGS) ×1
APL SKNCLS STERI-STRIP NONHPOA (GAUZE/BANDAGES/DRESSINGS) ×1
BAG COUNTER SPONGE SURGICOUNT (BAG) ×2 IMPLANT
BAG SPNG CNTER NS LX DISP (BAG) ×1
BAND INSRT 18 STRL LF DISP RB (MISCELLANEOUS) ×2
BAND RUBBER #18 3X1/16 STRL (MISCELLANEOUS) ×4 IMPLANT
BENZOIN TINCTURE PRP APPL 2/3 (GAUZE/BANDAGES/DRESSINGS) ×2 IMPLANT
BLADE CLIPPER SURG (BLADE) IMPLANT
BLADE SURG 11 STRL SS (BLADE) ×2 IMPLANT
BUR CUTTER 7.0 ROUND (BURR) ×2 IMPLANT
BUR MATCHSTICK NEURO 3.0 LAGG (BURR) ×2 IMPLANT
CANISTER SUCT 3000ML PPV (MISCELLANEOUS) ×2 IMPLANT
CARTRIDGE OIL MAESTRO DRILL (MISCELLANEOUS) ×1 IMPLANT
CLSR STERI-STRIP ANTIMIC 1/2X4 (GAUZE/BANDAGES/DRESSINGS) ×1 IMPLANT
DECANTER SPIKE VIAL GLASS SM (MISCELLANEOUS) ×1 IMPLANT
DERMABOND ADVANCED (GAUZE/BANDAGES/DRESSINGS) ×1
DERMABOND ADVANCED .7 DNX12 (GAUZE/BANDAGES/DRESSINGS) ×1 IMPLANT
DIFFUSER DRILL AIR PNEUMATIC (MISCELLANEOUS) ×2 IMPLANT
DRAPE HALF SHEET 40X57 (DRAPES) IMPLANT
DRAPE LAPAROTOMY 100X72X124 (DRAPES) ×2 IMPLANT
DRAPE MICROSCOPE LEICA (MISCELLANEOUS) ×2 IMPLANT
DRAPE SURG 17X23 STRL (DRAPES) ×2 IMPLANT
DRSG OPSITE POSTOP 4X6 (GAUZE/BANDAGES/DRESSINGS) ×1 IMPLANT
DURAPREP 26ML APPLICATOR (WOUND CARE) ×2 IMPLANT
ELECT BLADE 4.0 EZ CLEAN MEGAD (MISCELLANEOUS) ×2
ELECT REM PT RETURN 9FT ADLT (ELECTROSURGICAL) ×2
ELECTRODE BLDE 4.0 EZ CLN MEGD (MISCELLANEOUS) IMPLANT
ELECTRODE REM PT RTRN 9FT ADLT (ELECTROSURGICAL) ×1 IMPLANT
GAUZE 4X4 16PLY ~~LOC~~+RFID DBL (SPONGE) IMPLANT
GAUZE SPONGE 4X4 12PLY STRL (GAUZE/BANDAGES/DRESSINGS) ×2 IMPLANT
GLOVE EXAM NITRILE XL STR (GLOVE) IMPLANT
GLOVE SURG ENC MOIS LTX SZ7 (GLOVE) IMPLANT
GLOVE SURG ENC MOIS LTX SZ8 (GLOVE) ×2 IMPLANT
GLOVE SURG POLYISO LF SZ7.5 (GLOVE) ×1 IMPLANT
GLOVE SURG UNDER LTX SZ7.5 (GLOVE) ×3 IMPLANT
GLOVE SURG UNDER LTX SZ8.5 (GLOVE) ×2 IMPLANT
GLOVE SURG UNDER POLY LF SZ7 (GLOVE) IMPLANT
GOWN STRL REUS W/ TWL LRG LVL3 (GOWN DISPOSABLE) ×1 IMPLANT
GOWN STRL REUS W/ TWL XL LVL3 (GOWN DISPOSABLE) ×2 IMPLANT
GOWN STRL REUS W/TWL 2XL LVL3 (GOWN DISPOSABLE) IMPLANT
GOWN STRL REUS W/TWL LRG LVL3 (GOWN DISPOSABLE) ×2
GOWN STRL REUS W/TWL XL LVL3 (GOWN DISPOSABLE) ×4
KIT BASIN OR (CUSTOM PROCEDURE TRAY) ×2 IMPLANT
KIT TURNOVER KIT B (KITS) ×2 IMPLANT
NDL SPNL 22GX3.5 QUINCKE BK (NEEDLE) ×1 IMPLANT
NEEDLE HYPO 22GX1.5 SAFETY (NEEDLE) ×2 IMPLANT
NEEDLE SPNL 22GX3.5 QUINCKE BK (NEEDLE) ×2 IMPLANT
NS IRRIG 1000ML POUR BTL (IV SOLUTION) ×2 IMPLANT
OIL CARTRIDGE MAESTRO DRILL (MISCELLANEOUS) ×2
PACK LAMINECTOMY NEURO (CUSTOM PROCEDURE TRAY) ×2 IMPLANT
SPONGE SURGIFOAM ABS GEL SZ50 (HEMOSTASIS) ×2 IMPLANT
STRIP CLOSURE SKIN 1/2X4 (GAUZE/BANDAGES/DRESSINGS) ×2 IMPLANT
SUT VIC AB 0 CT1 18XCR BRD8 (SUTURE) ×1 IMPLANT
SUT VIC AB 0 CT1 8-18 (SUTURE) ×2
SUT VIC AB 2-0 CT1 18 (SUTURE) ×2 IMPLANT
SUT VICRYL 4-0 PS2 18IN ABS (SUTURE) ×2 IMPLANT
TOWEL GREEN STERILE (TOWEL DISPOSABLE) ×2 IMPLANT
TOWEL GREEN STERILE FF (TOWEL DISPOSABLE) ×2 IMPLANT
WATER STERILE IRR 1000ML POUR (IV SOLUTION) ×2 IMPLANT

## 2021-09-24 NOTE — Transfer of Care (Signed)
Immediate Anesthesia Transfer of Care Note  Patient: Sean [name]  Procedure(s) Performed: Left Sided Lumbar Two- Three Microdisectomy (Left: Spine Lumbar)  Patient Location: PACU  Anesthesia Type:General  Level of Consciousness: awake  Airway & Oxygen Therapy: Patient Spontanous Breathing  Post-op Assessment: Report given to RN  Post vital signs: stable  Last Vitals:  Vitals Value Taken Time  BP 142/100 09/24/21 0922  Temp    Pulse 76 09/24/21 0926  Resp 15 09/24/21 0926  SpO2 94 % 09/24/21 0926  Vitals shown include unvalidated device data.  Last Pain:  Vitals:   09/24/21 0029  TempSrc: Oral  PainSc:       Patients Stated Pain Goal: 4 (67/34/19 3790)  Complications: No notable events documented.

## 2021-09-24 NOTE — Progress Notes (Signed)
Physical Therapy Treatment Patient Details Name: Sean [name] MRN: [id number] DOB: 02/26/1946 Today's Date: 09/24/2021   History of Present Illness 75 y.o. male admitted on 09/22/21 for back and left leg pain.  Neurosurgery following.  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  Attempting conservative management.  s/p Lumbar laminectomy microdiscectomy L2-3 on the left with microdissection of the left L3 nerve root microscopic discectomy on 11/4. PMH of bil carpal tunnel ( no surgery), CAD, HTN, prostate CA s/p seed implant and radidation, urethral stricture, partial nephrectomy.    PT Comments    Patient seen following back surgery. Patient continues to complain of pain in L LE. Patient requires minA for sit to stand and short ambulation distance with RW. Patient continues to be limited by weakness, impaired balance, and decreased activity tolerance. Educated patient on log roll technique for protecting back, patient verbalized understanding. Continue to recommend SNF for ongoing Physical Therapy.       Recommendations for follow up therapy are one component of a multi-disciplinary discharge planning process, led by the attending physician.  Recommendations may be updated based on patient status, additional functional criteria and insurance authorization.  Follow Up Recommendations  Skilled nursing-short term rehab (<3 hours/day)     Assistance Recommended at Discharge Intermittent Supervision/Assistance  Equipment Recommendations  Rolling Sabrine Patchen (2 wheels);3in1 (PT)    Recommendations for Other Services       Precautions / Restrictions Precautions Precautions: Back;Fall Precaution Booklet Issued: No Precaution Comments: back for comfort, L leg weak and painful Required Braces or Orthoses:  (no brace needed per orders)     Mobility  Bed Mobility Overal bed mobility: Needs Assistance Bed Mobility: Sidelying to Sit;Rolling Rolling:  Supervision Sidelying to sit: Min assist       General bed mobility comments: minA for trunk elevation    Transfers Overall transfer level: Needs assistance Equipment used: Rolling Asar Evilsizer (2 wheels) Transfers: Sit to/from Stand Sit to Stand: Min assist           General transfer comment: minA for power up and steady from low bed surface.    Ambulation/Gait Ambulation/Gait assistance: Min assist Gait Distance (Feet): 6 Feet Assistive device: Rolling Miho Monda (2 wheels) Gait Pattern/deviations: Step-through pattern;Shuffle;Antalgic Gait velocity: decreased   General Gait Details: minA for balance for short ambulation to recliner. Cues for RW proximity.   Stairs             Wheelchair Mobility    Modified Rankin (Stroke Patients Only)       Balance Overall balance assessment: Needs assistance Sitting-balance support: Feet supported;Bilateral upper extremity supported Sitting balance-Leahy Scale: Good     Standing balance support: Bilateral upper extremity supported Standing balance-Leahy Scale: Poor Standing balance comment: needed external support from RW and therapist                            Cognition Arousal/Alertness: Awake/alert Behavior During Therapy: WFL for tasks assessed/performed Overall Cognitive Status: Within Functional Limits for tasks assessed                                          Exercises      General Comments        Pertinent Vitals/Pain Pain Assessment: Faces Faces Pain Scale: Hurts even more Pain Location: back and left leg Pain Descriptors /  Indicators: Guarding Pain Intervention(s): Monitored during session;Repositioned;Limited activity within patient's tolerance    Home Living                          Prior Function            PT Goals (current goals can now be found in the care plan section) Acute Rehab PT Goals Patient Stated Goal: to get well enough to go home with  his brother for a few weeks and has decided he should likely have the surgery PT Goal Formulation: With patient Time For Goal Achievement: 10/07/21 Potential to Achieve Goals: Good Progress towards PT goals: Progressing toward goals    Frequency    Min 3X/week      PT Plan Current plan remains appropriate    Co-evaluation              AM-PAC PT "6 Clicks" Mobility   Outcome Measure  Help needed turning from your back to your side while in a flat bed without using bedrails?: A Little Help needed moving from lying on your back to sitting on the side of a flat bed without using bedrails?: A Little Help needed moving to and from a bed to a chair (including a wheelchair)?: A Little Help needed standing up from a chair using your arms (e.g., wheelchair or bedside chair)?: A Little Help needed to walk in hospital room?: A Lot Help needed climbing 3-5 steps with a railing? : Total 6 Click Score: 15    End of Session Equipment Utilized During Treatment: Gait belt Activity Tolerance: Patient limited by pain Patient left: in chair;with call bell/phone within reach Nurse Communication: Mobility status PT Visit Diagnosis: Muscle weakness (generalized) (M62.81);Difficulty in walking, not elsewhere classified (R26.2);Pain Pain - Right/Left: Left Pain - part of body: Leg     Time: 7741-2878 PT Time Calculation (min) (ACUTE ONLY): 24 min  Charges:  $Gait Training: 23-37 mins                     Harsimran Westman A. Gilford Rile PT, DPT Acute Rehabilitation Services Pager [phone] Office [phone]    Sean [name] 09/24/2021, 5:43 PM

## 2021-09-24 NOTE — Anesthesia Postprocedure Evaluation (Signed)
Anesthesia Post Note  Patient: Sean [name]  Procedure(s) Performed: Left Sided Lumbar Two- Three Microdisectomy (Left: Spine Lumbar)     Patient location during evaluation: PACU Anesthesia Type: General Level of consciousness: awake and alert Pain management: pain level controlled Vital Signs Assessment: post-procedure vital signs reviewed and stable Respiratory status: spontaneous breathing, nonlabored ventilation and respiratory function stable Cardiovascular status: blood pressure returned to baseline and stable Postop Assessment: no apparent nausea or vomiting Anesthetic complications: no   No notable events documented.  Last Vitals:  Vitals:   09/24/21 1050 09/24/21 1055  BP:  (!) 184/89  Pulse: 87 62  Resp: 18 (!) 24  Temp:    SpO2: 93% 96%    Last Pain:  Vitals:   09/24/21 1050  TempSrc:   PainSc: 4                  Lidia Collum

## 2021-09-24 NOTE — Progress Notes (Signed)
PT Cancellation Note  Patient Details Name: Sean [name] MRN: [id number] DOB: 07/16/1946   Cancelled Treatment:    Reason Eval/Treat Not Completed: Patient at procedure or test/unavailable.  Pt in surgery.  PT to check back tomorrow to resume therapies.  Thanks,  Verdene Lennert, PT, DPT  Acute Rehabilitation Ortho Tech Supervisor [phone] pager #[phone] office      Wells Guiles B Tomeeka Plaugher 09/24/2021, 9:49 AM

## 2021-09-24 NOTE — Anesthesia Procedure Notes (Signed)
Procedure Name: Intubation Date/Time: 09/24/2021 7:39 AM Performed by: Geraldine Contras, CRNA Pre-anesthesia Checklist: Patient identified, Patient being monitored, Timeout performed, Emergency Drugs available and Suction available Patient Re-evaluated:Patient Re-evaluated prior to induction Oxygen Delivery Method: Circle system utilized Preoxygenation: Pre-oxygenation with 100% oxygen Induction Type: IV induction Ventilation: Mask ventilation without difficulty Laryngoscope Size: Mac and 3 Grade View: Grade I Tube type: Oral Tube size: 7.5 mm Number of attempts: 1 Airway Equipment and Method: Stylet Placement Confirmation: ETT inserted through vocal cords under direct vision, positive ETCO2 and breath sounds checked- equal and bilateral Secured at: 21 cm Tube secured with: Tape Dental Injury: Teeth and Oropharynx as per pre-operative assessment

## 2021-09-24 NOTE — Progress Notes (Signed)
Subjective: Patient reports  overall stable last night  Objective: Vital signs in last 24 hours: Temp:  [98.1 F (36.7 C)-98.6 F (37 C)] 98.6 F (37 C) (11/04 0029) Pulse Rate:  [69-97] 76 (11/04 0029) Resp:  [16-19] 17 (11/04 0029) BP: (121-182)/(65-107) 142/89 (11/04 0029) SpO2:  [93 %-94 %] 93 % (11/04 0029)  Intake/Output from previous day: 11/03 0701 - 11/04 0700 In: 720 [P.O.:720] Out: 400 [Urine:400] Intake/Output this shift: No intake/output data recorded.  Exam unchanged with some pain limited weakness left quad 4 to 4+ out of 5  Lab Results: Recent Labs    09/22/21 0610  WBC 11.7*  HGB 16.3  HCT 46.1  PLT 166   BMET Recent Labs    09/22/21 0610  NA 139  K 2.9*  CL 107  CO2 21*  GLUCOSE 158*  BUN 23  CREATININE 1.15  CALCIUM 8.9    Studies/Results: MR Lumbar Spine W Wo Contrast  Result Date: 09/22/2021 CLINICAL DATA:  Bone neoplasm suspected. History of prostate cancer. Bilateral lower extremity weakness with difficulty walking. EXAM: MRI LUMBAR SPINE WITHOUT AND WITH CONTRAST TECHNIQUE: Multiplanar and multiecho pulse sequences of the lumbar spine were obtained without and with intravenous contrast. CONTRAST:  56mL GADAVIST GADOBUTROL 1 MMOL/ML IV SOLN COMPARISON:  CT abdomen 07/16/2021 FINDINGS: Segmentation:  5 lumbar type vertebral bodies. Alignment: Mild scoliotic curvature convex to the right. One or 2 mm of degenerative anterolisthesis at L4-5. Vertebrae: No regional fracture. Benign appearing lipoma or hemangioma within the T12 vertebral body. Second benign appearing hemangioma within the L5 vertebral body. Discogenic endplate marrow changes throughout the region, with mild edema and enhancement at the L1-2 and L2-3 level. Conus medullaris and cauda equina: Conus extends to the L1 level. Conus and cauda equina appear normal. Paraspinal and other soft tissues: Negative Disc levels: T12-L1: Mild noncompressive disc bulge. L1-2: Endplate osteophytes and  bulging of the disc. Mild facet and ligamentous hypertrophy. Moderate multifactorial stenosis at this level with crowding of the nerve roots. L2-3: Endplate osteophytes and bulging of the disc. Left posterolateral disc herniation with a 9 mm fragment migrated caudally in the left lateral recess which could focally compress the left L3 nerve. Mild stenosis of the right lateral recess. L3-4: Left posterolateral to foraminal disc herniation. Stenosis of both lateral recesses. Left foraminal stenosis on the left that could compress the exiting L3 nerve. L4-5: Chronic facet arthropathy with 1 or 2 mm of anterolisthesis. Mild bulging of the disc. Moderate stenosis of the canal, most severe in the lateral recesses. Neural foraminal stenosis right worse than left. Neural compression could occur on either side at this level. L5-S1: Endplate osteophytes and mild bulging of the disc. Facet degeneration and hypertrophy. Mild narrowing of the subarticular lateral recesses and neural foramina but no likely neural compression. IMPRESSION: No sign of metastatic disease to bone. Benign appearing hemangiomas within the T12 and L5 vertebral bodies. Discogenic endplate marrow changes, including mild edema and enhancement at the L1-2 and L2-3 levels. L1-2: Moderate multifactorial stenosis with crowding of the nerve roots could be symptomatic. L2-3: Stenosis of both lateral recesses could cause neural compression. 9 mm caudally migrated disc herniation in the left lateral recess could focally affect the left L3 nerve. L3-4: Circumferential disc protrusion with a left foraminal to extraforaminal herniation. Stenosis both lateral recesses could cause neural compression. Left foraminal stenosis likely to compress the L3 nerve. L4-5: Chronic facet arthropathy with 1 or 2 mm of anterolisthesis. Bulging of the disc. Moderate canal  stenosis, most severe in the lateral recesses, could cause neural compression on either or both sides. Foraminal  stenosis worse on the right could cause neural compression. L5-S1: Disc bulge. Facet degeneration and hypertrophy. No compressive stenosis. Electronically Signed   By: Nelson Chimes M.D.   On: 09/22/2021 11:58   DG Hip Unilat W or Wo Pelvis 2-3 Views Left  Result Date: 09/22/2021 CLINICAL DATA:  Pain, difficulty walking EXAM: DG HIP (WITH OR WITHOUT PELVIS) 2-3V LEFT COMPARISON:  None. FINDINGS: There is evidence of previous brachytherapy with numerous metallic densities in the region of prostate. No recent fracture or dislocation is seen. Small bony spurs seen in the lateral aspect of left hip. There are no focal lytic or sclerotic lesions. Degenerative changes are noted in the visualized lower lumbar spine. IMPRESSION: No fracture or dislocation is seen. There are no focal lytic or sclerotic lesions. Degenerative changes are noted with small bony spurs in the lateral aspect of left hip. Degenerative changes are noted in the visualized lower lumbar spine. Electronically Signed   By: Elmer Picker M.D.   On: 09/22/2021 10:20    Assessment/Plan: 74 year old presents for left L2-3 microdiscectomy we have extensively gone over the risks and benefits of the operation with the patient as well as perioperative course expectations of outcome and alternatives of surgery and he understands and agrees to proceed forward.  LOS: 1 day     Elaina Hoops 09/24/2021, 7:14 AM

## 2021-09-24 NOTE — Op Note (Signed)
Preoperative diagnosis: Herniated nucleus pulposus L2-3 left  Postoperative diagnosis: Same  Procedure: Lumbar laminectomy microdiscectomy L2-3 on the left with microdissection of the left L3 nerve root microscopic discectomy  Surgeon: Dominica Severin Mirai Greenwood  Assistant: Nash Shearer  Anesthesia: General  EBL: Minimal  HPI: 75 year old gentleman progressive worsening back and left hip and leg pain rating down anterior quad consistent with an L3 nerve root pattern.  Patient presented to the ER in severe pain initially got better on IV steroids but then started to regress again due to his progression of clinical syndrome failure of conservative treatment and imaging findings I recommended a lumbar microdiscectomy at L2-3 on the left.  I extensively went over the risks and benefits of that operation with him as well as perioperative course expectations of outcome and alternatives of surgery and he understood and agreed to proceed forward.  Operative procedure: Patient was brought into the OR was [place] general anesthesia positioned prone on the Wilson frame his back was prepped and draped in routine sterile fashion.  Preoperative x-ray localized the appropriate level.  After infiltration of 10 cc lidocaine with epi midline incision was made and Bovie electrocautery was used to take down the subcutaneous tissue and subperiosteal dissection was carried lamina of L2 and L3 on the left confirmed by intraoperative x-ray.  So then the inferior aspect of lamina L2 medial facet complex and superior aspect of L3 was drilled down with a high-speed drill laminotomy was begun with a 2 and 3 Miller Kerrison punch.  Ligament was identified and removed in piecemeal fashion exposing thecal sac.  Under microscopic lamination under by the medial facet allowed identification of the L3 pedicle and the L2-3 disc base.  The L3 nerve was dissected off of a very large disc herniation still partially contained within the ligament ligament  was incised and several large free fragments were removed from underneath the L3 nerve root medial to the pedicle and inferior to the disc base.  After all the free fragments removed there is no further inferior fragments the disc base was evaluated was bulging I cut into it removed the bulging disc and decompress the central canal.  At the end of the discectomy there is no further stenosis either centrally or foraminally the wound was copiously irrigated to Kassim states was maintained Gelfoam was ON top of dura and the muscle fascia approximate layers of Vicryl skin was closed running 4 subcuticular.  Dermabond benzoin Steri-Strips and a sterile dressing was applied patient recovery in stable condition.  At the end the case all needle counts and sponge counts were correct.

## 2021-09-24 NOTE — Anesthesia Preprocedure Evaluation (Addendum)
Anesthesia Evaluation  Patient identified by MRN, date of birth, ID band Patient awake    Reviewed: Allergy & Precautions, NPO status , Patient's Chart, lab work & pertinent test results, reviewed documented beta blocker date and time   History of Anesthesia Complications Negative for: history of anesthetic complications  Airway Mallampati: II  TM Distance: >3 FB Neck ROM: Full    Dental  (+) Dental Advisory Given, Teeth Intact   Pulmonary neg pulmonary ROS,    Pulmonary exam normal        Cardiovascular hypertension, Pt. on medications and Pt. 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)   H/o prostate cancer    Musculoskeletal  (+) Arthritis ,   Abdominal   Peds  Hematology negative hematology ROS (+)   Anesthesia Other Findings   Reproductive/Obstetrics                            Anesthesia Physical Anesthesia Plan  ASA: 3  Anesthesia Plan: General   Post-op Pain Management:    Induction: Intravenous  PONV Risk Score and Plan: 2 and Ondansetron, Dexamethasone, Treatment may vary due to age or medical condition and Midazolam  Airway Management Planned: Oral ETT  Additional Equipment: None  Intra-op Plan:   Post-operative Plan: Extubation in OR  Informed Consent: I have reviewed the patients History and Physical, chart, labs and discussed the procedure including the risks, benefits and alternatives for the proposed anesthesia with the patient or authorized representative who has indicated his/her understanding and acceptance.      Dental advisory given  Plan Discussed with:   Anesthesia Plan Comments:         Anesthesia Quick Evaluation

## 2021-09-25 ENCOUNTER — Encounter (HOSPITAL_COMMUNITY): Payer: Self-pay | Admitting: Neurosurgery

## 2021-09-25 LAB — BASIC METABOLIC PANEL
Anion gap: 7 (ref 5–15)
BUN: 27 mg/dL — ABNORMAL HIGH (ref 8–23)
CO2: 25 mmol/L (ref 22–32)
Calcium: 8.3 mg/dL — ABNORMAL LOW (ref 8.9–10.3)
Chloride: 106 mmol/L (ref 98–111)
Creatinine, Ser: 0.93 mg/dL (ref 0.61–1.24)
GFR, Estimated: >60 mL/min (ref >60)
Glucose, Bld: 160 mg/dL — ABNORMAL HIGH (ref 70–99)
Potassium: 3.8 mmol/L (ref 3.5–5.1)
Sodium: 138 mmol/L (ref 135–145)

## 2021-09-25 LAB — MAGNESIUM: Magnesium: 2.2 mg/dL (ref 1.7–2.4)

## 2021-09-25 MED ORDER — PANTOPRAZOLE SODIUM 40 MG PO TBEC
40.0000 mg | DELAYED_RELEASE_TABLET | Freq: Every day | ORAL | Status: DC
  Administered 2021-09-25 – 2021-09-27 (×3): 40 mg via ORAL
  Filled 2021-09-25 (×3): qty 1

## 2021-09-25 NOTE — Progress Notes (Signed)
Occupational Therapy Treatment Patient Details Name: Sean [name] MRN: [id number] DOB: Jan 10, 1946 Today's Date: 09/25/2021   History of present illness 75 y.o. male admitted on 09/22/21 for back and left leg pain.  Neurosurgery following.  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  Attempting conservative management.  s/p Lumbar laminectomy microdiscectomy L2-3 on the left with microdissection of the left L3 nerve root microscopic discectomy on 11/4. PMH of bil carpal tunnel ( no surgery), CAD, HTN, prostate CA s/p seed implant and radidation, urethral stricture, partial nephrectomy.   OT comments  Patient with good progress toward all patient focused goals.  Patient was shown, and able to use pieces of the hip kit for lower body dressing.  He is moving with general supervision for toileting, occasionally needing Min Guard.  The deficit is, that he cannot walk anything more than short distances without pain and discomfort.  OT continues to defer to the surgeon for discharge recommendations, but essentially if the patient believes he can manage at his brother's home with the appropriate DME and [place] OT, home with [facility] would be the recommendation.  If not, OT would recommend short term rehab at a local SNF to maximize mobility and function prior to home.  OT will follow in the acute setting.     Recommendations for follow up therapy are one component of a multi-disciplinary discharge planning process, led by the attending physician.  Recommendations may be updated based on patient status, additional functional criteria and insurance authorization.    Follow Up Recommendations  Follow physician's recommendations for discharge plan and follow up therapies    Assistance Recommended at Discharge Intermittent Supervision/Assistance  Equipment Recommendations  BSC;Tub/shower bench    Recommendations for Other Services      Precautions / Restrictions Precautions Precautions:  Back;Fall Precaution Booklet Issued: Yes (comment) Precaution Comments: reviewed Restrictions Weight Bearing Restrictions: No       Mobility Bed Mobility   Bed Mobility: Sit to Supine       Sit to supine: Supervision        Transfers Overall transfer level: Needs assistance Equipment used: Rolling walker (2 wheels) Transfers: Sit to/from Stand;Stand Pivot Transfers Sit to Stand: Supervision Stand pivot transfers: Supervision               Balance Overall balance assessment: Needs assistance Sitting-balance support: Feet supported;Bilateral upper extremity supported Sitting balance-Leahy Scale: Good     Standing balance support: Bilateral upper extremity supported Standing balance-Leahy Scale: Fair                             ADL either performed or assessed with clinical judgement   ADL       Grooming: Wash/dry hands;Min guard;Standing               Lower Body Dressing: Min guard;Sit to/from stand;Cueing for compensatory techniques   Toilet Transfer: Supervision/safety;Comfort height toilet;Ambulation   Toileting- Clothing Manipulation and Hygiene: Min guard;Sit to/from stand         General ADL Comments: Reviewed and used pieces of the hip kit for dressing                       Cognition Arousal/Alertness: Awake/alert Behavior During Therapy: WFL for tasks assessed/performed Overall Cognitive Status: Within Functional Limits for tasks assessed  General Comments: Verbose          Exercises     Shoulder Instructions       General Comments      Pertinent Vitals/ Pain       Pain Assessment: 0-10 Pain Score: 4  Pain Location: back and left leg Pain Descriptors / Indicators: Guarding Pain Intervention(s): Monitored during session                                                          Frequency  Min 2X/week        Progress Toward  Goals  OT Goals(current goals can now be found in the care plan section)  Progress towards OT goals: Progressing toward goals  Acute Rehab OT Goals Patient Stated Goal: Not sure if he is going home, or ST Rehab OT Goal Formulation: With patient Time For Goal Achievement: 10/07/21 Potential to Achieve Goals: Good  Plan Discharge plan remains appropriate    Co-evaluation                 AM-PAC OT "6 Clicks" Daily Activity     Outcome Measure   Help from another person eating meals?: None Help from another person taking care of personal grooming?: A Little Help from another person toileting, which includes using toliet, bedpan, or urinal?: A Little Help from another person bathing (including washing, rinsing, drying)?: A Little Help from another person to put on and taking off regular upper body clothing?: None Help from another person to put on and taking off regular lower body clothing?: A Little 6 Click Score: 20    End of Session Equipment Utilized During Treatment: Rolling walker (2 wheels)  OT Visit Diagnosis: Unsteadiness on feet (R26.81);Pain Pain - Right/Left: Left Pain - part of body: Leg   Activity Tolerance Patient tolerated treatment well   Patient Left in bed;with call bell/phone within reach   Nurse Communication          Time: 1405-1430 OT Time Calculation (min): 25 min  Charges: OT General Charges $OT Visit: 1 Visit OT Treatments $Self Care/Home Management : 23-37 mins  09/25/2021  RP, OTR/L  Acute Rehabilitation Services  Office:  [phone]   Metta Clines 09/25/2021, 2:50 PM

## 2021-09-25 NOTE — Progress Notes (Signed)
Physical Therapy Treatment Patient Details Name: Sean [name] MRN: [id number] DOB: 02/17/1946 Today's Date: 09/25/2021   History of Present Illness 75 y.o. male admitted on 09/22/21 for back and left leg pain.  Neurosurgery following.  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  Attempting conservative management.  s/p Lumbar laminectomy microdiscectomy L2-3 on the left with microdissection of the left L3 nerve root microscopic discectomy on 11/4. PMH of bil carpal tunnel ( no surgery), CAD, HTN, prostate CA s/p seed implant and radidation, urethral stricture, partial nephrectomy.    PT Comments    Pt admitted with above diagnosis. Pt was able to ambulate with RW in room and incr distance with need for cues for upright posture as he fatigues quickly. Pt states that he plans to go home with his brother after the SNF Rehab and that he will need to go by his house to get some papers. His house has 6 steps so he wants to work toward practicing this.   Pt currently with functional limitations due to balance and endurance deficits. Pt will benefit from skilled PT to increase their independence and safety with mobility to allow discharge to the venue listed below.      Recommendations for follow up therapy are one component of a multi-disciplinary discharge planning process, led by the attending physician.  Recommendations may be updated based on patient status, additional functional criteria and insurance authorization.  Follow Up Recommendations  Skilled nursing-short term rehab (<3 hours/day)     Assistance Recommended at Discharge Intermittent Supervision/Assistance  Equipment Recommendations  Rolling walker (2 wheels);3in1 (PT)    Recommendations for Other Services OT consult     Precautions / Restrictions Precautions Precautions: Back;Fall Precaution Booklet Issued: Yes (comment) Precaution Comments: reviewed Required Braces or Orthoses:  (no brace needed per  orders) Restrictions Weight Bearing Restrictions: No     Mobility  Bed Mobility               General bed mobility comments: Pt in chair on arrival    Transfers Overall transfer level: Needs assistance Equipment used: Rolling walker (2 wheels) Transfers: Sit to/from Stand;Stand Pivot Transfers Sit to Stand: Supervision Stand pivot transfers: Supervision              Ambulation/Gait Ambulation/Gait assistance: Min assist;Min guard Gait Distance (Feet): 45 Feet Assistive device: Rolling walker (2 wheels) Gait Pattern/deviations: Step-through pattern;Shuffle;Antalgic Gait velocity: decreased Gait velocity interpretation: <1.8 ft/sec, indicate of risk for recurrent falls General Gait Details: minto min gaurd A for balance for short ambulation in room. Cues for RW proximity. Also needed cues for upright posture as he tends to flex as he ambulates.   Stairs             Wheelchair Mobility    Modified Rankin (Stroke Patients Only)       Balance Overall balance assessment: Needs assistance         Standing balance support: Bilateral upper extremity supported Standing balance-Leahy Scale: Poor Standing balance comment: needed UE support from RW                            Cognition Arousal/Alertness: Awake/alert Behavior During Therapy: WFL for tasks assessed/performed Overall Cognitive Status: Within Functional Limits for tasks assessed  General Comments: Verbose        Exercises General Exercises - Lower Extremity Ankle Circles/Pumps: AROM;Both;5 reps Long Arc Quad: AROM;Both;5 reps    General Comments General comments (skin integrity, edema, etc.): Pt reports he will need to practice 6 steps as he will need to go by his house to get some items even though he will stay with brother.      Pertinent Vitals/Pain Pain Assessment: Faces Faces Pain Scale: Hurts even more Pain Location:  back and left leg Pain Descriptors / Indicators: Guarding Pain Intervention(s): Limited activity within patient's tolerance;Monitored during session;Repositioned    Home Living                          Prior Function            PT Goals (current goals can now be found in the care plan section) Acute Rehab PT Goals Patient Stated Goal: to get well enough to go home with his brother for a few weeks and has decided he should likely have the surgery PT Goal Formulation: With patient Time For Goal Achievement: 10/07/21 Potential to Achieve Goals: Good Progress towards PT goals: Progressing toward goals    Frequency    Min 3X/week      PT Plan Current plan remains appropriate    Co-evaluation              AM-PAC PT "6 Clicks" Mobility   Outcome Measure  Help needed turning from your back to your side while in a flat bed without using bedrails?: A Little Help needed moving from lying on your back to sitting on the side of a flat bed without using bedrails?: A Little Help needed moving to and from a bed to a chair (including a wheelchair)?: A Little Help needed standing up from a chair using your arms (e.g., wheelchair or bedside chair)?: A Little Help needed to walk in hospital room?: A Lot Help needed climbing 3-5 steps with a railing? : Total 6 Click Score: 15    End of Session Equipment Utilized During Treatment: Gait belt Activity Tolerance: Patient limited by pain Patient left: in chair;with call bell/phone within reach Nurse Communication: Mobility status PT Visit Diagnosis: Muscle weakness (generalized) (M62.81);Difficulty in walking, not elsewhere classified (R26.2);Pain Pain - Right/Left: Left Pain - part of body: Leg     Time:  -     Charges:    Gait training                   [place] M,PT Acute Rehab Services [phone] (pager)    Alvira Philips 09/25/2021, 8:30 PM

## 2021-09-25 NOTE — Progress Notes (Signed)
PHARMACIST - PHYSICIAN COMMUNICATION  DR:   Saintclair Halsted  CONCERNING: IV to Oral Route Change Policy  RECOMMENDATION: This patient is receiving Protonix by the intravenous route.  Based on criteria approved by the Pharmacy and Therapeutics Committee, the intravenous medication(s) is/are being converted to the equivalent oral dose form(s).   DESCRIPTION: These criteria include: The patient is eating (either orally or via tube) and/or has been taking other orally administered medications for a least 24 hours The patient has no evidence of active gastrointestinal bleeding or impaired GI absorption (gastrectomy, short bowel, patient on TNA or NPO).  If you have questions about this conversion, please contact the Pharmacy Department  []   [phone] )  [facility] []   [phone] )  [facility] []   [phone] )  [facility] []   [phone] )  [facility] []   [phone] )  [place], [facility] 09/25/2021 10:33 AM

## 2021-09-25 NOTE — Progress Notes (Signed)
Postop day 1.  Overall patient doing reasonably well.  Moderate back pain.  Leg pain much improved.  Vital signs are stable.  He is afebrile.  Wound clean and dry.  Motor and sensory function stable.  Progress reasonably well following lumbar surgery.  Continue efforts at mobilization and therapy.  Likely discharge Monday.

## 2021-09-26 NOTE — Progress Notes (Signed)
Postop day 2.  Patient continues to make good progress.  His back pain is well controlled.  His lower extremity symptoms are much better.  He is slowly increasing his activity.  He is afebrile.  His vitals are stable.  His wound is clean and dry.  Chest and abdomen are benign.  Motor and sensory function stable.  Progressing well following lumbar decompressive surgery.  Continue efforts at mobilization.  Working towards discharge plan which will likely involve skilled nursing facility placement.

## 2021-09-26 NOTE — TOC Initial Note (Signed)
Transition of Care [facility]) - Initial/Assessment Note    Patient Details  Name: Sean [name] MRN: [id number] Date of Birth: September 23, 1946  Transition of [facility]) CM/SW Contact:    Alfredia Ferguson, LCSW Phone Number: 09/26/2021, 10:51 AM  Clinical Narrative:                 CSW met with patient to follow-up on PT recommendation for SNF for inpatient rehab. CSW introduced herself and role to patient. CSW noted patient presented in an engaged and sociable manner and reported he was doing well today. CSW informed of recommendation and provided psychoeducation regarding SNF type of care. CSW discussed about geographic location and helped process with patient his family resources and hobbies he does and wants to continue to do. CSW noted patient consented for referrals to SNF facilities and expressed preference for facilities in [place]. CSW will start SNF referrals and follow-up with bed offers. Patient provided Medicare.gov SNF providers list.   Expected Discharge Plan: Skilled Nursing Facility Barriers to Discharge: Continued Medical Work up   Patient Goals and CMS Choice Patient states their goals for this hospitalization and ongoing recovery are:: "I'm trying to move into a single floor home closer to my brother and I want to be able to keep doing what I like doing." CMS Medicare.gov Compare Post Acute Care list provided to:: Patient Choice offered to / list presented to : Patient  Expected Discharge Plan and Services Expected Discharge Plan: [place] Choice: [place] arrangements for the past 2 months: Single Family Home                                      Prior Living Arrangements/Services Living arrangements for the past 2 months: Single Family Home Lives with:: Self Patient language and need for interpreter reviewed:: Yes Do you feel safe going back to the place where you live?: Yes      Need for  Family Participation in Patient Care: No (Comment) Care giver support system in place?: No (comment) Current home services: DME Criminal Activity/Legal Involvement Pertinent to Current Situation/Hospitalization: No - Comment as needed  Activities of Daily Living Home Assistive Devices/Equipment: Eyeglasses, Cane (specify quad or straight), Other (Comment) (walk-in shower, tub/shower unit, single point cane) ADL Screening (condition at time of admission) Patient's cognitive ability adequate to safely complete daily activities?: Yes Is the patient deaf or have difficulty hearing?: No Does the patient have difficulty seeing, even when wearing glasses/contacts?: No Does the patient have difficulty concentrating, remembering, or making decisions?: No Patient able to express need for assistance with ADLs?: Yes Does the patient have difficulty dressing or bathing?: Yes Independently performs ADLs?: No Communication: Independent Dressing (OT): Needs assistance Is this a change from baseline?: Change from baseline, expected to last >3 days Grooming: Independent Feeding: Independent Bathing: Needs assistance Is this a change from baseline?: Change from baseline, expected to last >3 days Toileting: Dependent Is this a change from baseline?: Change from baseline, expected to last >3days In/Out Bed: Dependent Is this a change from baseline?: Change from baseline, expected to last >3 days Walks in Home: Dependent Is this a change from baseline?: Change from baseline, expected to last >3 days Does the patient have difficulty walking or climbing stairs?: Yes Weakness of Legs: Both Weakness of Arms/Hands: None  Permission Sought/Granted Permission sought to  share information with : Facility Art therapist granted to share information with : Yes, Verbal Permission Granted  Share Information with NAME: Facilities for SNF referral           Emotional Assessment Appearance::  Appears stated age Attitude/Demeanor/Rapport: Charismatic, Engaged, Self-Confident Affect (typically observed): Accepting, Calm Orientation: : Oriented to Place, Oriented to Self, Oriented to  Time, Oriented to Situation Alcohol / Substance Use: Not Applicable Psych Involvement: No (comment)  Admission diagnosis:  Left leg weakness [R29.898] Lumbar disc herniation [M51.26] HNP (herniated nucleus pulposus), lumbar [M51.26] Patient Active Problem List   Diagnosis Date Noted   HNP (herniated nucleus pulposus), lumbar 09/24/2021   Lumbar disc herniation 09/22/2021   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

## 2021-09-26 NOTE — NC FL2 (Signed)
[place] LEVEL OF CARE SCREENING TOOL     IDENTIFICATION  Patient Name: Sean [name] Birthdate: Jun 23, 1946 Sex: male Admission Date (Current Location): 09/22/2021  [facility] and [place] Number:  Herbalist and Address:  [facility] [place], [place] [address] [place], [place] [address]      Provider Number: [id number]  Attending Physician Name and Address:  Kary Kos, MD  Relative Name and Phone Number:  Breion Novacek, [phone]    Current Level of Care: SNF Recommended Level of Care: [place] Prior Approval Number:    Date Approved/Denied: 09/26/21 PASRR Number: [id number]  Discharge Plan: SNF    Current Diagnoses: Patient Active Problem List   Diagnosis Date Noted   HNP (herniated nucleus pulposus), lumbar 09/24/2021   Lumbar disc herniation 09/22/2021   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   HYPERLIPIDEMIA 11/26/2009   Essential hypertension 11/26/2009   Coronary atherosclerosis 11/26/2009    Orientation RESPIRATION BLADDER Height & Weight     Self, Time, Situation, Place  Normal Continent Weight: 256 lb 2.8 oz (116.2 kg) Height:  5\' 10"  (177.8 cm)  BEHAVIORAL SYMPTOMS/MOOD NEUROLOGICAL BOWEL NUTRITION STATUS      Continent Diet (see DC summary)  AMBULATORY STATUS COMMUNICATION OF NEEDS Skin   Limited Assist Verbally Normal                       Personal Care Assistance Level of Assistance  Bathing, Feeding, Dressing Bathing Assistance: Limited assistance   Dressing Assistance: Limited assistance     Functional Limitations Info  Hearing   Hearing Info: Impaired      SPECIAL CARE FACTORS FREQUENCY  PT (By licensed PT), OT (By licensed OT)     PT Frequency: 5x weekly OT Frequency: 5x weekly            Contractures Contractures Info: Not present    Additional Factors Info  Code Status, Allergies Code Status Info: Full Allergies Info: Morphine, codein, Lariam.            Current Medications (09/26/2021):  This is the current hospital active medication list Current Facility-Administered Medications  Medication Dose Route Frequency Provider Last Rate Last Admin   0.9 %  sodium chloride infusion  250 mL Intravenous Continuous Kary Kos, MD       acetaminophen (TYLENOL) tablet 650 mg  650 mg Oral Q4H PRN Kary Kos, MD       Or   acetaminophen (TYLENOL) suppository 650 mg  650 mg Rectal Q4H PRN Kary Kos, MD       allopurinol (ZYLOPRIM) tablet 200 mg  200 mg Oral QPM Kary Kos, MD   200 mg at 09/25/21 1702   alum & mag hydroxide-simeth (MAALOX/MYLANTA) 200-200-20 MG/5ML suspension 30 mL  30 mL Oral Q6H PRN Kary Kos, MD       amLODipine (NORVASC) tablet 2.5 mg  2.5 mg Oral Daily Kary Kos, MD   2.5 mg at 09/26/21 0831   ascorbic acid (VITAMIN C) tablet 1,000 mg  1,000 mg Oral Daily Kary Kos, MD   1,000 mg at 09/26/21 0831   aspirin chewable tablet 81 mg  81 mg Oral Daily Kary Kos, MD   81 mg at 09/26/21 0831   atorvastatin (LIPITOR) tablet 80 mg  80 mg Oral QPM Kary Kos, MD   80 mg at 09/25/21 1702   cyclobenzaprine (FLEXERIL) tablet 10 mg  10 mg Oral TID PRN Kary Kos, MD  dexamethasone (DECADRON) injection 4 mg  4 mg Intravenous Q6H Meyran, Ocie Cornfield, NP   4 mg at 09/26/21 0831   hydrALAZINE (APRESOLINE) injection 10 mg  10 mg Intravenous Q4H PRN Kary Kos, MD       HYDROmorphone (DILAUDID) injection 0.5 mg  0.5 mg Intravenous Q2H PRN Kary Kos, MD       menthol-cetylpyridinium (CEPACOL) lozenge 3 mg  1 lozenge Oral PRN Kary Kos, MD       Or   phenol (CHLORASEPTIC) mouth spray 1 spray  1 spray Mouth/Throat PRN Kary Kos, MD       methocarbamol (ROBAXIN) 500 mg in dextrose 5 % 50 mL IVPB  500 mg Intravenous Q6H PRN Kary Kos, MD       metoprolol succinate (TOPROL-XL) 24 hr tablet 75 mg  75 mg Oral q morning Kary Kos, MD   75 mg at 09/26/21 0846   multivitamin with minerals tablet 1 tablet  1 tablet Oral Daily Kary Kos, MD   1  tablet at 09/26/21 0831   ondansetron (ZOFRAN) tablet 4 mg  4 mg Oral Q6H PRN Kary Kos, MD       Or   ondansetron [facility]) injection 4 mg  4 mg Intravenous Q6H PRN Kary Kos, MD       oxyCODONE (Oxy IR/ROXICODONE) immediate release tablet 5 mg  5 mg Oral Q3H PRN Kary Kos, MD   5 mg at 09/24/21 2254   pantoprazole (PROTONIX) EC tablet 40 mg  40 mg Oral QHS Kary Kos, MD   40 mg at 09/25/21 2100   predniSONE (STERAPRED UNI-PAK 21 TAB) tablet 5 mg  5 mg Oral 4X daily taper Kary Kos, MD   5 mg at 09/26/21 0831   ramipril (ALTACE) capsule 10 mg  10 mg Oral Daily Kary Kos, MD   10 mg at 09/26/21 0831   sodium chloride flush (NS) 0.9 % injection 3 mL  3 mL Intravenous Q12H Kary Kos, MD   3 mL at 09/26/21 0831   sodium chloride flush (NS) 0.9 % injection 3 mL  3 mL Intravenous PRN Kary Kos, MD         Discharge Medications: Please see discharge summary for a list of discharge medications.  Relevant Imaging Results:  Relevant Lab Results:   Additional Information SSN: [id number], [employer] 01/04/20, 01/27/20  Alfredia Ferguson, LCSW

## 2021-09-27 NOTE — TOC Progression Note (Addendum)
Transition of Care [facility]) - Progression Note    Patient Details  Name: Sean [name] MRN: [id number] Date of Birth: [birth year]  Transition of Care [facility]) CM/SW [place], [place] Phone Number: 09/27/2021, 3:16 PM  Clinical Narrative:     CSW gave patient bed offers- patient requested CSW call [facility].  CSW talked with Tammy/[facility] Neoma Laming - they will review and call CSW back.   Thurmond Butts, MSW, LCSW Clinical Social Worker    Expected Discharge Plan: Skilled Nursing Facility Barriers to Discharge: Continued Medical Work up  Expected Discharge Plan and Services Expected Discharge Plan: [place] Choice: [place] arrangements for the past 2 months: Single Family Home                                       Social Determinants of Health (SDOH) Interventions    Readmission Risk Interventions No flowsheet data found.

## 2021-09-27 NOTE — Progress Notes (Signed)
Occupational Therapy Treatment Patient Details Name: Sean [name] MRN: [id number] DOB: 07-11-1946 Today's Date: 09/27/2021   History of present illness 75 y.o. male admitted on 09/22/21 for back and left leg pain.  Neurosurgery following.  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  Attempting conservative management.  s/p Lumbar laminectomy microdiscectomy L2-3 on the left with microdissection of the left L3 nerve root microscopic discectomy on 11/4. PMH of bil carpal tunnel ( no surgery), CAD, HTN, prostate CA s/p seed implant and radidation, urethral stricture, partial nephrectomy.   OT comments  This 75 yo male seen today to see how he may do without AE for LBD, today he can cross his legs to get to his feet for socks/shoes and to wash his feet while seated with back supported. He can stand with min guard A and transfer with same with RW, min guard A as well for bed mobility.   Recommendations for follow up therapy are one component of a multi-disciplinary discharge planning process, led by the attending physician.  Recommendations may be updated based on patient status, additional functional criteria and insurance authorization.    Follow Up Recommendations  Skilled nursing-short term rehab (<3 hours/day)    Assistance Recommended at Discharge Intermittent Supervision/Assistance  Equipment Recommendations  Other (comment) (TBD next venue)       Precautions / Restrictions Precautions Precautions: Back;Fall Precaution Comments: reviewed (pt recalled no bending; but needed cues for no twisting and lifting) Restrictions Weight Bearing Restrictions: No       Mobility Bed Mobility Overal bed mobility: Needs Assistance Bed Mobility: Sit to Sidelying;Rolling Rolling: Supervision       Sit to sidelying: Min guard General bed mobility comments: Pt in chair on arrival    Transfers Overall transfer level: Needs assistance Equipment used: Rolling walker  (2 wheels) Transfers: Sit to/from Stand Sit to Stand: Min guard                Balance Overall balance assessment: Needs assistance Sitting-balance support: No upper extremity supported;Feet supported Sitting balance-Leahy Scale: Good     Standing balance support: Bilateral upper extremity supported Standing balance-Leahy Scale: Poor                             ADL either performed or assessed with clinical judgement   ADL Overall ADL's : Needs assistance/impaired               Lower Body Bathing Details (indicate cue type and reason): in a seated position in recliner pt can cross one leg of the other to get to feet to wash them       Lower Body Dressing Details (indicate cue type and reason): in a seated position in recliner pt can cross one leg of the other to get to feet to doff/donn socks                    Extremity/Trunk Assessment Upper Extremity Assessment Upper Extremity Assessment: Overall WFL for tasks assessed            Vision Patient Visual Report: No change from baseline            Cognition Arousal/Alertness: Awake/alert Behavior During Therapy: WFL for tasks assessed/performed Overall Cognitive Status: Within Functional Limits for tasks assessed  Pertinent Vitals/ Pain       Pain Assessment: No/denies pain Faces Pain Scale: Hurts little more Pain Location: back and left leg with ambulation Pain Descriptors / Indicators: Discomfort Pain Intervention(s): Monitored during session;Repositioned         Frequency  Min 2X/week        Progress Toward Goals  OT Goals(current goals can now be found in the care plan section)  Progress towards OT goals: Progressing toward goals  Acute Rehab OT Goals Patient Stated Goal: to go to rehab then home OT Goal Formulation: With patient Time For Goal Achievement: 10/07/21 Potential to Achieve  Goals: Good  Plan Discharge plan needs to be updated       AM-PAC OT "6 Clicks" Daily Activity     Outcome Measure   Help from another person eating meals?: None Help from another person taking care of personal grooming?: A Little Help from another person toileting, which includes using toliet, bedpan, or urinal?: A Little Help from another person bathing (including washing, rinsing, drying)?: A Little Help from another person to put on and taking off regular upper body clothing?: A Little Help from another person to put on and taking off regular lower body clothing?: A Little 6 Click Score: 19    End of Session Equipment Utilized During Treatment: Rolling walker (2 wheels)  OT Visit Diagnosis: Unsteadiness on feet (R26.81);Muscle weakness (generalized) (M62.81)   Activity Tolerance Patient tolerated treatment well   Patient Left in bed;with call bell/phone within reach           Time: 1312-1329 OT Time Calculation (min): 17 min  Charges: OT General Charges $OT Visit: 1 Visit OT Treatments $Self Care/Home Management : 8-22 mins  [place], OTR/L Acute [employer] Pager [phone] Office [phone]    Almon Register 09/27/2021, 3:54 PM

## 2021-09-27 NOTE — Progress Notes (Signed)
Subjective: Patient reports feeling much better than preop, a lot of his pain in his leg is better. He is feeling stronger as well  Objective: Vital signs in last 24 hours: Temp:  [97.8 F (36.6 C)-98.1 F (36.7 C)] 98.1 F (36.7 C) (11/07 0401) Pulse Rate:  [59-77] 60 (11/07 0401) Resp:  [16-20] 16 (11/07 0401) BP: (120-160)/(58-98) 135/72 (11/07 0401) SpO2:  [92 %-97 %] 92 % (11/07 0401)  Intake/Output from previous day: 11/06 0701 - 11/07 0700 In: 243 [P.O.:240; I.V.:3] Out: 550 [Urine:550] Intake/Output this shift: No intake/output data recorded.  Lab Results: Lab Results  Component Value Date   WBC 11.7 (H) 09/22/2021   HGB 16.3 09/22/2021   HCT 46.1 09/22/2021   MCV 92.6 09/22/2021   PLT 166 09/22/2021   Lab Results  Component Value Date   INR 1.1 09/23/2021   BMET Lab Results  Component Value Date   NA 138 09/25/2021   K 3.8 09/25/2021   CL 106 09/25/2021   CO2 25 09/25/2021   GLUCOSE 160 (H) 09/25/2021   BUN 27 (H) 09/25/2021   CREATININE 0.93 09/25/2021   CALCIUM 8.3 (L) 09/25/2021    Studies/Results: No results found.  Assessment/Plan: Postop day 3 lumbar microdiskectomy for left L3 radiculopathy and weakness. He is doing much better. Continue efforts on mobilization. Awaiting SNF placement.    LOS: 4 days    Ocie Cornfield Maddix Heinz 09/27/2021, 8:10 AM

## 2021-09-27 NOTE — Progress Notes (Signed)
Physical Therapy Treatment Patient Details Name: Sean [name] MRN: [id number] DOB: 1946/06/17 Today's Date: 09/27/2021   History of Present Illness 75 y.o. male admitted on 09/22/21 for back and left leg pain.  Neurosurgery following.  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  Attempting conservative management.  s/p Lumbar laminectomy microdiscectomy L2-3 on the left with microdissection of the left L3 nerve root microscopic discectomy on 11/4. PMH of bil carpal tunnel ( no surgery), CAD, HTN, prostate CA s/p seed implant and radidation, urethral stricture, partial nephrectomy.    PT Comments    Progressing slowly with ambulation distance and activity tolerance.  He reports plans to move in with his brother and sister in law but feel he will need STSNF level rehab to maximize mobility, independence and tolerance.  PT will continue to follow acutely.    Recommendations for follow up therapy are one component of a multi-disciplinary discharge planning process, led by the attending physician.  Recommendations may be updated based on patient status, additional functional criteria and insurance authorization.  Follow Up Recommendations  Skilled nursing-short term rehab (<3 hours/day)     Assistance Recommended at Discharge Intermittent Supervision/Assistance  Equipment Recommendations  Rolling walker (2 wheels);BSC/3in1    Recommendations for Other Services       Precautions / Restrictions Precautions Precautions: Back;Fall     Mobility  Bed Mobility               General bed mobility comments: Pt in chair on arrival    Transfers Overall transfer level: Needs assistance Equipment used: Rolling walker (2 wheels) Transfers: Sit to/from Stand Sit to Stand: Min assist           General transfer comment: for safety and balance up from recliner and back to recliner with cues for hand placement    Ambulation/Gait Ambulation/Gait assistance:  Min guard Gait Distance (Feet): 50 Feet (x 2) Assistive device: Rolling walker (2 wheels) Gait Pattern/deviations: Step-through pattern;Decreased stride length;Wide base of support;Shuffle       General Gait Details: frequent cues for posture, for walker proximity when backing to chair, kept chair close to increase ambulation distance, sat in hallway, then returned to room   Stairs             Wheelchair Mobility    Modified Rankin (Stroke Patients Only)       Balance Overall balance assessment: Needs assistance Sitting-balance support: Feet supported Sitting balance-Leahy Scale: Good     Standing balance support: Bilateral upper extremity supported;Reliant on assistive device for balance Standing balance-Leahy Scale: Poor                              Cognition Arousal/Alertness: Awake/alert Behavior During Therapy: WFL for tasks assessed/performed Overall Cognitive Status: Within Functional Limits for tasks assessed                                          Exercises      General Comments        Pertinent Vitals/Pain Faces Pain Scale: Hurts little more Pain Location: back and left leg with ambulation Pain Descriptors / Indicators: Discomfort Pain Intervention(s): Monitored during session;Repositioned    Home Living  Prior Function            PT Goals (current goals can now be found in the care plan section) Progress towards PT goals: Progressing toward goals    Frequency    Min 3X/week      PT Plan Current plan remains appropriate    Co-evaluation              AM-PAC PT "6 Clicks" Mobility   Outcome Measure  Help needed turning from your back to your side while in a flat bed without using bedrails?: A Little Help needed moving from lying on your back to sitting on the side of a flat bed without using bedrails?: A Little Help needed moving to and from a bed to a chair  (including a wheelchair)?: A Little Help needed standing up from a chair using your arms (e.g., wheelchair or bedside chair)?: A Little Help needed to walk in hospital room?: A Lot Help needed climbing 3-5 steps with a railing? : Total 6 Click Score: 15    End of Session Equipment Utilized During Treatment: Gait belt Activity Tolerance: Patient limited by pain Patient left: in chair;with call bell/phone within reach   PT Visit Diagnosis: Muscle weakness (generalized) (M62.81);Difficulty in walking, not elsewhere classified (R26.2);Pain Pain - Right/Left: Left     Time: 0315-9458 PT Time Calculation (min) (ACUTE ONLY): 29 min  Charges:  $Gait Training: 23-37 mins                     Magda Kiel, PT Acute Rehabilitation Services [phone] [phone] 09/27/2021    Reginia Naas 09/27/2021, 1:31 PM

## 2021-09-28 DIAGNOSIS — I2510 Atherosclerotic heart disease of native coronary artery without angina pectoris: Secondary | ICD-10-CM | POA: Diagnosis not present

## 2021-09-28 DIAGNOSIS — M4986 Spondylopathy in diseases classified elsewhere, lumbar region: Secondary | ICD-10-CM | POA: Diagnosis not present

## 2021-09-28 DIAGNOSIS — Z7401 Bed confinement status: Secondary | ICD-10-CM | POA: Diagnosis not present

## 2021-09-28 DIAGNOSIS — M5126 Other intervertebral disc displacement, lumbar region: Secondary | ICD-10-CM | POA: Diagnosis not present

## 2021-09-28 DIAGNOSIS — I10 Essential (primary) hypertension: Secondary | ICD-10-CM | POA: Diagnosis not present

## 2021-09-28 DIAGNOSIS — Z23 Encounter for immunization: Secondary | ICD-10-CM | POA: Diagnosis not present

## 2021-09-28 DIAGNOSIS — R5383 Other fatigue: Secondary | ICD-10-CM | POA: Diagnosis not present

## 2021-09-28 DIAGNOSIS — M1380 Other specified arthritis, unspecified site: Secondary | ICD-10-CM | POA: Diagnosis not present

## 2021-09-28 DIAGNOSIS — R0902 Hypoxemia: Secondary | ICD-10-CM | POA: Diagnosis not present

## 2021-09-28 DIAGNOSIS — G8929 Other chronic pain: Secondary | ICD-10-CM | POA: Diagnosis not present

## 2021-09-28 DIAGNOSIS — Z741 Need for assistance with personal care: Secondary | ICD-10-CM | POA: Diagnosis not present

## 2021-09-28 DIAGNOSIS — S33100D Subluxation of unspecified lumbar vertebra, subsequent encounter: Secondary | ICD-10-CM | POA: Diagnosis not present

## 2021-09-28 DIAGNOSIS — E569 Vitamin deficiency, unspecified: Secondary | ICD-10-CM | POA: Diagnosis not present

## 2021-09-28 DIAGNOSIS — Z736 Limitation of activities due to disability: Secondary | ICD-10-CM | POA: Diagnosis not present

## 2021-09-28 DIAGNOSIS — E785 Hyperlipidemia, unspecified: Secondary | ICD-10-CM | POA: Diagnosis not present

## 2021-09-28 DIAGNOSIS — M48061 Spinal stenosis, lumbar region without neurogenic claudication: Secondary | ICD-10-CM | POA: Diagnosis not present

## 2021-09-28 DIAGNOSIS — M1000 Idiopathic gout, unspecified site: Secondary | ICD-10-CM | POA: Diagnosis not present

## 2021-09-28 DIAGNOSIS — R41841 Cognitive communication deficit: Secondary | ICD-10-CM | POA: Diagnosis not present

## 2021-09-28 DIAGNOSIS — M6259 Muscle wasting and atrophy, not elsewhere classified, multiple sites: Secondary | ICD-10-CM | POA: Diagnosis not present

## 2021-09-28 LAB — SARS CORONAVIRUS 2 (TAT 6-24 HRS): SARS Coronavirus 2: NEGATIVE

## 2021-09-28 MED ORDER — HYDROCODONE-ACETAMINOPHEN 5-325 MG PO TABS: 1.0000 | ORAL_TABLET | Freq: Four times a day (QID) | ORAL | 0 refills | Status: DC | PRN

## 2021-09-28 MED ORDER — HYDROCODONE-ACETAMINOPHEN 5-325 MG PO TABS: 1.0000 | ORAL_TABLET | Freq: Four times a day (QID) | ORAL | Status: DC | PRN

## 2021-09-28 NOTE — Progress Notes (Signed)
Subjective: Patient reports  significant provement lower extremity pain minimal back pain  Objective: Vital signs in last 24 hours: Temp:  [97.6 F (36.4 C)-97.9 F (36.6 C)] 97.6 F (36.4 C) (11/08 0752) Pulse Rate:  [60-92] 75 (11/08 0752) Resp:  [18-20] 18 (11/08 0752) BP: (123-156)/(60-96) 150/96 (11/08 0752) SpO2:  [90 %-95 %] 95 % (11/08 0752)  Intake/Output from previous day: 11/07 0701 - 11/08 0700 In: 3 [I.V.:3] Out: -  Intake/Output this shift: No intake/output data recorded.  Awake and alert strength is 5 out of 5 distal lower extremities bilaterally patient is ambulating  Lab Results: No results for input(s): WBC, HGB, HCT, PLT in the last 72 hours. BMET No results for input(s): NA, K, CL, CO2, GLUCOSE, BUN, CREATININE, CALCIUM in the last 72 hours.  Studies/Results: No results found.  Assessment/Plan: Postop day 4 lumbar limited to microdiscectomy doing very well significant provement in lower extremity strength but still requires significant assistance can only ambulate a few feet secondary to weakness and significant deconditioning.  Agree with rehab skilled nursing placement awaiting on bed  LOS: 5 days     Elaina Hoops 09/28/2021, 8:12 AM

## 2021-09-28 NOTE — Progress Notes (Signed)
Physical Therapy Treatment Patient Details Name: Sean [name] MRN: [id number] DOB: [birth year] Today's Date: 09/28/2021   History of Present Illness 75 y.o. male admitted on 09/22/21 for back and left leg pain.  Neurosurgery following.  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  Attempting conservative management.  s/p Lumbar laminectomy microdiscectomy L2-3 on the left with microdissection of the left L3 nerve root microscopic discectomy on 11/4. PMH of bil carpal tunnel ( no surgery), CAD, HTN, prostate CA s/p seed implant and radidation, urethral stricture, partial nephrectomy.    PT Comments    Pt ambulatory for short room distances, requiring min assist overall for mobility tasks at this time. Pt limited in tolerance today due to back pain from prolonged sitting in chair, requests return to supine until he transfers to SNF. Pt plans to leave for SNF today.      Recommendations for follow up therapy are one component of a multi-disciplinary discharge planning process, led by the attending physician.  Recommendations may be updated based on patient status, additional functional criteria and insurance authorization.  Follow Up Recommendations  Skilled nursing-short term rehab (<3 hours/day)     Assistance Recommended at Discharge Frequent or constant Supervision/Assistance  Equipment Recommendations  Rolling walker (2 wheels);BSC/3in1    Recommendations for Other Services       Precautions / Restrictions Precautions Precautions: Back;Fall Precaution Comments: reviewed during functional tasks, needs cues to not twist when flushing toilet and upright posture during gait Restrictions Weight Bearing Restrictions: No     Mobility  Bed Mobility Overal bed mobility: Needs Assistance Bed Mobility: Sit to Sidelying;Rolling Rolling: Min assist       Sit to sidelying: Mod assist General bed mobility comments: min-mod assist for trunk and LE management  for log roll back into bed, cues for log roll technique    Transfers Overall transfer level: Needs assistance Equipment used: Rolling walker (2 wheels) Transfers: Sit to/from Stand Sit to Stand: Min guard;Min assist           General transfer comment: first stand requiring power up assist, second stand from toilet min guard for safety with pt use of bathroom railing to assist self in rise.    Ambulation/Gait Ambulation/Gait assistance: Min guard Gait Distance (Feet): 25 Feet (+12 to get to bathroom) Assistive device: Rolling walker (2 wheels) Gait Pattern/deviations: Step-through pattern;Decreased stride length;Wide base of support;Shuffle;Trunk flexed Gait velocity: decr     General Gait Details: close guard for safety, verbal cuing for upright posture   Stairs             Wheelchair Mobility    Modified Rankin (Stroke Patients Only)       Balance Overall balance assessment: Needs assistance Sitting-balance support: No upper extremity supported;Feet supported Sitting balance-Leahy Scale: Good     Standing balance support: Bilateral upper extremity supported Standing balance-Leahy Scale: Poor Standing balance comment: reliant on RW                            Cognition Arousal/Alertness: Awake/alert Behavior During Therapy: WFL for tasks assessed/performed Overall Cognitive Status: Within Functional Limits for tasks assessed                                          Exercises      General Comments  Pertinent Vitals/Pain Pain Assessment: Faces Faces Pain Scale: Hurts even more Pain Location: front of legs, incision Pain Descriptors / Indicators: Grimacing;Guarding;Discomfort Pain Intervention(s): Monitored during session;Limited activity within patient's tolerance;Repositioned    Home Living                          Prior Function            PT Goals (current goals can now be found in the care  plan section) Acute Rehab PT Goals Patient Stated Goal: to get well enough to go home with his brother for a few weeks and has decided he should likely have the surgery PT Goal Formulation: With patient Time For Goal Achievement: 10/07/21 Potential to Achieve Goals: Good Progress towards PT goals: Progressing toward goals    Frequency    Min 5X/week      PT Plan Current plan remains appropriate    Co-evaluation              AM-PAC PT "6 Clicks" Mobility   Outcome Measure  Help needed turning from your back to your side while in a flat bed without using bedrails?: A Little Help needed moving from lying on your back to sitting on the side of a flat bed without using bedrails?: A Lot Help needed moving to and from a bed to a chair (including a wheelchair)?: A Lot Help needed standing up from a chair using your arms (e.g., wheelchair or bedside chair)?: A Little Help needed to walk in hospital room?: A Little Help needed climbing 3-5 steps with a railing? : A Lot 6 Click Score: 15    End of Session   Activity Tolerance: Patient limited by pain Patient left: in chair;with call bell/phone within reach Nurse Communication: Mobility status PT Visit Diagnosis: Muscle weakness (generalized) (M62.81);Difficulty in walking, not elsewhere classified (R26.2);Pain Pain - Right/Left:  (low) Pain - part of body:  (back)     Time: 5784-6962 PT Time Calculation (min) (ACUTE ONLY): 15 min  Charges:  $Gait Training: 8-22 mins                     Stacie Glaze, PT DPT Acute Rehabilitation Services Pager [phone]  Office [phone]    [place] 09/28/2021, 2:35 PM

## 2021-09-28 NOTE — Care Management Important Message (Signed)
Important Message  Patient Details  Name: Sean [name] MRN: [id number] Date of Birth: [birth year]   Medicare Important Message Given:  Yes     [name] Montine Circle 09/28/2021, 2:47 PM

## 2021-09-28 NOTE — Progress Notes (Signed)
Pt. To discharge to SNF, Ivs taken out, belongings given to pt, report given to nurse receiving, PTAR to transport pt.

## 2021-09-28 NOTE — TOC Transition Note (Signed)
Transition of Care [facility]) - CM/SW Discharge Note   Patient Details  Name: Sean [name] MRN: [id number] Date of Birth: 02-28-1946  Transition of Care [facility]) CM/SW Contact:  Vinie Sill, LCSW Phone Number: 09/28/2021, 1:51 PM   Clinical Narrative:     Patient will Discharge to: [facility]  Discharge Date: 09/28/2021 Family Notified: Patient Declined Transport By: [name]  Per MD patient is ready for discharge. RN, patient, and facility notified of discharge. Discharge Summary sent to facility. RN given number for [phone], Room 607-B. Ambulance transport requested for patient.   Clinical Social Worker signing off.  Thurmond Butts, MSW, LCSW Clinical Social Worker     Final next level of care: Skilled Nursing Facility Barriers to Discharge: Barriers Resolved   Patient Goals and CMS Choice Patient states their goals for this hospitalization and ongoing recovery are:: "I'm trying to move into a single floor home closer to my brother and I want to be able to keep doing what I like doing." CMS Medicare.gov Compare Post Acute Care list provided to:: Patient Choice offered to / list presented to : Patient  Discharge Placement              Patient chooses bed at: [facility] [place] Patient to be transferred to facility by: [place] Name of family member notified: patient  declined Patient and family notified of of transfer: 09/28/21  Discharge Plan and Services     Post Acute Care Choice: Welcome                               Social Determinants of Health (SDOH) Interventions     Readmission Risk Interventions No flowsheet data found.

## 2021-09-28 NOTE — Discharge Summary (Signed)
Physician Discharge Summary  Patient ID: Sean [name] MRN: [id number] DOB/AGE: [birth year] 75 y.o. Estimated body mass index is 36.76 kg/m as calculated from the following:   Height as of this encounter: 5\' 10"  (1.778 m).   Weight as of this encounter: 116.2 kg.   Admit date: 09/22/2021 Discharge date: 09/28/2021  Admission Diagnoses:Left L3 radiculopathy from herniated nucleus polyposis L2-3 left  Discharge Diagnoses:  same Active Problems:   Lumbar disc herniation   HNP (herniated nucleus pulposus), lumbar   Discharged Condition: good  Hospital Course:  patient was admitted from the emergency room placed on IV steroids for left leg pain and weakness.  He initially improved but then regressed.  Workup had revealed a large disc herniation on the left at L2-3 so due to his failure of conservative treatment it imaging findings and progression clinical syndrome we recommended lumbar microdiskectomy.  He went to the OR on hospital day 2 noted significant improvement in left leg pain and strength immediately.  Mobilized with physical and occupational therapy throughout the next few days.  And was stable for discharge to the nursing facility on postop day 4. Patient should continue with physical and occupational therapy and follow-up with me in 2 weeks.  Consults: Significant Diagnostic Studies: Treatments:  Lumbar microdiskectomy L2-3 left Discharge Exam: Blood pressure (!) 140/94, pulse 71, temperature 97.9 F (36.6 C), resp. rate 14, height 5\' 10"  (1.778 m), weight 116.2 kg, SpO2 95 %.  Slight left quad weakness 4 to 4+ out of 5 otherwise 5/5 incision clean dry and intact  Disposition:  skilled nursing facility   Allergies as of 09/28/2021       Reactions   Morphine And Related Nausea And Vomiting   Severe vomiting    Codeine Other (See Comments)   constipation   Lariam [mefloquine Hcl] Other (See Comments)   Respiratory problems        Medication List     TAKE these  medications    allopurinol 100 MG tablet Commonly known as: ZYLOPRIM Take 200 mg by mouth every evening.   amLODipine 2.5 MG tablet Commonly known as: NORVASC Take 2.5 mg by mouth daily.   APPLE CIDER VINEGAR PO Take 450 mg by mouth daily.   aspirin 81 MG chewable tablet Chew 81 mg by mouth daily.   atorvastatin 80 MG tablet Commonly known as: LIPITOR Take 80 mg by mouth every evening.   Cinnamon 500 MG Tabs Take 1,000 mg by mouth daily.   Ginger Root 550 MG Caps Take 550 mg by mouth daily.   HYDROcodone-acetaminophen 5-325 MG tablet Commonly known as: NORCO/VICODIN Take 1 tablet by mouth every 6 (six) hours as needed for moderate pain.   ibuprofen 200 MG tablet Commonly known as: ADVIL Take 800 mg by mouth every 6 (six) hours as needed for mild pain.   metoprolol succinate 50 MG 24 hr tablet Commonly known as: TOPROL-XL Take 75 mg by mouth every morning. Take with or immediately following a meal.   multivitamin tablet Take 1 tablet by mouth daily.   ramipril 10 MG capsule Commonly known as: ALTACE Take 10 mg by mouth daily.   TART CHERRY ADVANCED PO Take 1,200 mg by mouth daily.   Turmeric 500 MG Caps Take 500 mg by mouth daily.   vitamin C 1000 MG tablet Take 1,000 mg by mouth daily.         Signed: Elaina Hoops 09/28/2021, 12:25 PM

## 2021-09-29 DIAGNOSIS — E785 Hyperlipidemia, unspecified: Secondary | ICD-10-CM | POA: Diagnosis not present

## 2021-09-29 DIAGNOSIS — I10 Essential (primary) hypertension: Secondary | ICD-10-CM | POA: Diagnosis not present

## 2021-09-29 DIAGNOSIS — S33100D Subluxation of unspecified lumbar vertebra, subsequent encounter: Secondary | ICD-10-CM | POA: Diagnosis not present

## 2021-10-04 DIAGNOSIS — I10 Essential (primary) hypertension: Secondary | ICD-10-CM | POA: Diagnosis not present

## 2021-10-04 DIAGNOSIS — E785 Hyperlipidemia, unspecified: Secondary | ICD-10-CM | POA: Diagnosis not present

## 2021-10-04 DIAGNOSIS — S33100D Subluxation of unspecified lumbar vertebra, subsequent encounter: Secondary | ICD-10-CM | POA: Diagnosis not present

## 2021-10-11 DIAGNOSIS — S33100D Subluxation of unspecified lumbar vertebra, subsequent encounter: Secondary | ICD-10-CM | POA: Diagnosis not present

## 2021-10-11 DIAGNOSIS — I10 Essential (primary) hypertension: Secondary | ICD-10-CM | POA: Diagnosis not present

## 2021-10-11 DIAGNOSIS — E785 Hyperlipidemia, unspecified: Secondary | ICD-10-CM | POA: Diagnosis not present

## 2021-10-18 DIAGNOSIS — I10 Essential (primary) hypertension: Secondary | ICD-10-CM | POA: Diagnosis not present

## 2021-10-18 DIAGNOSIS — E785 Hyperlipidemia, unspecified: Secondary | ICD-10-CM | POA: Diagnosis not present

## 2021-10-18 DIAGNOSIS — S33100D Subluxation of unspecified lumbar vertebra, subsequent encounter: Secondary | ICD-10-CM | POA: Diagnosis not present

## 2021-10-20 DIAGNOSIS — R5383 Other fatigue: Secondary | ICD-10-CM | POA: Diagnosis not present

## 2021-10-20 DIAGNOSIS — E569 Vitamin deficiency, unspecified: Secondary | ICD-10-CM | POA: Diagnosis not present

## 2021-10-20 DIAGNOSIS — M1000 Idiopathic gout, unspecified site: Secondary | ICD-10-CM | POA: Diagnosis not present

## 2021-10-20 DIAGNOSIS — I2510 Atherosclerotic heart disease of native coronary artery without angina pectoris: Secondary | ICD-10-CM | POA: Diagnosis not present

## 2021-10-20 DIAGNOSIS — R41841 Cognitive communication deficit: Secondary | ICD-10-CM | POA: Diagnosis not present

## 2021-10-20 DIAGNOSIS — I10 Essential (primary) hypertension: Secondary | ICD-10-CM | POA: Diagnosis not present

## 2021-10-20 DIAGNOSIS — C642 Malignant neoplasm of left kidney, except renal pelvis: Secondary | ICD-10-CM | POA: Diagnosis not present

## 2021-10-20 DIAGNOSIS — M4986 Spondylopathy in diseases classified elsewhere, lumbar region: Secondary | ICD-10-CM | POA: Diagnosis not present

## 2021-10-20 DIAGNOSIS — M1380 Other specified arthritis, unspecified site: Secondary | ICD-10-CM | POA: Diagnosis not present

## 2021-10-20 DIAGNOSIS — E785 Hyperlipidemia, unspecified: Secondary | ICD-10-CM | POA: Diagnosis not present

## 2021-10-20 DIAGNOSIS — Z4889 Encounter for other specified surgical aftercare: Secondary | ICD-10-CM | POA: Diagnosis not present

## 2021-10-20 DIAGNOSIS — G5603 Carpal tunnel syndrome, bilateral upper limbs: Secondary | ICD-10-CM | POA: Diagnosis not present

## 2021-10-20 DIAGNOSIS — Z9889 Other specified postprocedural states: Secondary | ICD-10-CM | POA: Diagnosis not present

## 2021-10-20 DIAGNOSIS — M5416 Radiculopathy, lumbar region: Secondary | ICD-10-CM | POA: Diagnosis not present

## 2021-10-20 DIAGNOSIS — M6259 Muscle wasting and atrophy, not elsewhere classified, multiple sites: Secondary | ICD-10-CM | POA: Diagnosis not present

## 2021-10-20 DIAGNOSIS — Z9181 History of falling: Secondary | ICD-10-CM | POA: Diagnosis not present

## 2021-10-20 DIAGNOSIS — M48061 Spinal stenosis, lumbar region without neurogenic claudication: Secondary | ICD-10-CM | POA: Diagnosis not present

## 2021-10-20 DIAGNOSIS — H9190 Unspecified hearing loss, unspecified ear: Secondary | ICD-10-CM | POA: Diagnosis not present

## 2021-10-20 DIAGNOSIS — G8929 Other chronic pain: Secondary | ICD-10-CM | POA: Diagnosis not present

## 2021-10-20 DIAGNOSIS — Z87442 Personal history of urinary calculi: Secondary | ICD-10-CM | POA: Diagnosis not present

## 2021-10-20 DIAGNOSIS — M5126 Other intervertebral disc displacement, lumbar region: Secondary | ICD-10-CM | POA: Diagnosis not present

## 2021-10-20 DIAGNOSIS — Z8546 Personal history of malignant neoplasm of prostate: Secondary | ICD-10-CM | POA: Diagnosis not present

## 2021-10-26 DIAGNOSIS — M48061 Spinal stenosis, lumbar region without neurogenic claudication: Secondary | ICD-10-CM | POA: Diagnosis not present

## 2021-10-26 DIAGNOSIS — M4986 Spondylopathy in diseases classified elsewhere, lumbar region: Secondary | ICD-10-CM | POA: Diagnosis not present

## 2021-10-26 DIAGNOSIS — Z4889 Encounter for other specified surgical aftercare: Secondary | ICD-10-CM | POA: Diagnosis not present

## 2021-10-26 DIAGNOSIS — M5126 Other intervertebral disc displacement, lumbar region: Secondary | ICD-10-CM | POA: Diagnosis not present

## 2021-10-26 DIAGNOSIS — G8929 Other chronic pain: Secondary | ICD-10-CM | POA: Diagnosis not present

## 2021-10-26 DIAGNOSIS — M5416 Radiculopathy, lumbar region: Secondary | ICD-10-CM | POA: Diagnosis not present

## 2021-10-28 DIAGNOSIS — G8929 Other chronic pain: Secondary | ICD-10-CM | POA: Diagnosis not present

## 2021-10-28 DIAGNOSIS — Z4889 Encounter for other specified surgical aftercare: Secondary | ICD-10-CM | POA: Diagnosis not present

## 2021-10-28 DIAGNOSIS — M48061 Spinal stenosis, lumbar region without neurogenic claudication: Secondary | ICD-10-CM | POA: Diagnosis not present

## 2021-10-28 DIAGNOSIS — M5416 Radiculopathy, lumbar region: Secondary | ICD-10-CM | POA: Diagnosis not present

## 2021-10-28 DIAGNOSIS — M4986 Spondylopathy in diseases classified elsewhere, lumbar region: Secondary | ICD-10-CM | POA: Diagnosis not present

## 2021-10-28 DIAGNOSIS — M5126 Other intervertebral disc displacement, lumbar region: Secondary | ICD-10-CM | POA: Diagnosis not present

## 2021-11-02 DIAGNOSIS — G8929 Other chronic pain: Secondary | ICD-10-CM | POA: Diagnosis not present

## 2021-11-02 DIAGNOSIS — Z4889 Encounter for other specified surgical aftercare: Secondary | ICD-10-CM | POA: Diagnosis not present

## 2021-11-02 DIAGNOSIS — M5126 Other intervertebral disc displacement, lumbar region: Secondary | ICD-10-CM | POA: Diagnosis not present

## 2021-11-02 DIAGNOSIS — M48061 Spinal stenosis, lumbar region without neurogenic claudication: Secondary | ICD-10-CM | POA: Diagnosis not present

## 2021-11-02 DIAGNOSIS — M4986 Spondylopathy in diseases classified elsewhere, lumbar region: Secondary | ICD-10-CM | POA: Diagnosis not present

## 2021-11-02 DIAGNOSIS — M5416 Radiculopathy, lumbar region: Secondary | ICD-10-CM | POA: Diagnosis not present

## 2021-11-10 DIAGNOSIS — Z4889 Encounter for other specified surgical aftercare: Secondary | ICD-10-CM | POA: Diagnosis not present

## 2021-11-10 DIAGNOSIS — M5126 Other intervertebral disc displacement, lumbar region: Secondary | ICD-10-CM | POA: Diagnosis not present

## 2021-11-10 DIAGNOSIS — G8929 Other chronic pain: Secondary | ICD-10-CM | POA: Diagnosis not present

## 2021-11-10 DIAGNOSIS — M4986 Spondylopathy in diseases classified elsewhere, lumbar region: Secondary | ICD-10-CM | POA: Diagnosis not present

## 2021-11-10 DIAGNOSIS — M5416 Radiculopathy, lumbar region: Secondary | ICD-10-CM | POA: Diagnosis not present

## 2021-11-10 DIAGNOSIS — M48061 Spinal stenosis, lumbar region without neurogenic claudication: Secondary | ICD-10-CM | POA: Diagnosis not present

## 2021-11-11 DIAGNOSIS — M5416 Radiculopathy, lumbar region: Secondary | ICD-10-CM | POA: Diagnosis not present

## 2021-11-11 DIAGNOSIS — M4986 Spondylopathy in diseases classified elsewhere, lumbar region: Secondary | ICD-10-CM | POA: Diagnosis not present

## 2021-11-11 DIAGNOSIS — G8929 Other chronic pain: Secondary | ICD-10-CM | POA: Diagnosis not present

## 2021-11-11 DIAGNOSIS — Z4889 Encounter for other specified surgical aftercare: Secondary | ICD-10-CM | POA: Diagnosis not present

## 2021-11-11 DIAGNOSIS — M48061 Spinal stenosis, lumbar region without neurogenic claudication: Secondary | ICD-10-CM | POA: Diagnosis not present

## 2021-11-11 DIAGNOSIS — M5126 Other intervertebral disc displacement, lumbar region: Secondary | ICD-10-CM | POA: Diagnosis not present

## 2021-11-17 DIAGNOSIS — M5416 Radiculopathy, lumbar region: Secondary | ICD-10-CM | POA: Diagnosis not present

## 2021-11-17 DIAGNOSIS — M48061 Spinal stenosis, lumbar region without neurogenic claudication: Secondary | ICD-10-CM | POA: Diagnosis not present

## 2021-11-17 DIAGNOSIS — M5126 Other intervertebral disc displacement, lumbar region: Secondary | ICD-10-CM | POA: Diagnosis not present

## 2021-11-17 DIAGNOSIS — M4986 Spondylopathy in diseases classified elsewhere, lumbar region: Secondary | ICD-10-CM | POA: Diagnosis not present

## 2021-11-17 DIAGNOSIS — Z4889 Encounter for other specified surgical aftercare: Secondary | ICD-10-CM | POA: Diagnosis not present

## 2021-11-17 DIAGNOSIS — G8929 Other chronic pain: Secondary | ICD-10-CM | POA: Diagnosis not present

## 2021-11-19 DIAGNOSIS — Z9889 Other specified postprocedural states: Secondary | ICD-10-CM | POA: Diagnosis not present

## 2021-11-19 DIAGNOSIS — E785 Hyperlipidemia, unspecified: Secondary | ICD-10-CM | POA: Diagnosis not present

## 2021-11-19 DIAGNOSIS — Z9181 History of falling: Secondary | ICD-10-CM | POA: Diagnosis not present

## 2021-11-19 DIAGNOSIS — M1000 Idiopathic gout, unspecified site: Secondary | ICD-10-CM | POA: Diagnosis not present

## 2021-11-19 DIAGNOSIS — G5603 Carpal tunnel syndrome, bilateral upper limbs: Secondary | ICD-10-CM | POA: Diagnosis not present

## 2021-11-19 DIAGNOSIS — M1380 Other specified arthritis, unspecified site: Secondary | ICD-10-CM | POA: Diagnosis not present

## 2021-11-19 DIAGNOSIS — M5126 Other intervertebral disc displacement, lumbar region: Secondary | ICD-10-CM | POA: Diagnosis not present

## 2021-11-19 DIAGNOSIS — Z8546 Personal history of malignant neoplasm of prostate: Secondary | ICD-10-CM | POA: Diagnosis not present

## 2021-11-19 DIAGNOSIS — I10 Essential (primary) hypertension: Secondary | ICD-10-CM | POA: Diagnosis not present

## 2021-11-19 DIAGNOSIS — R5383 Other fatigue: Secondary | ICD-10-CM | POA: Diagnosis not present

## 2021-11-19 DIAGNOSIS — C642 Malignant neoplasm of left kidney, except renal pelvis: Secondary | ICD-10-CM | POA: Diagnosis not present

## 2021-11-19 DIAGNOSIS — M4986 Spondylopathy in diseases classified elsewhere, lumbar region: Secondary | ICD-10-CM | POA: Diagnosis not present

## 2021-11-19 DIAGNOSIS — H9190 Unspecified hearing loss, unspecified ear: Secondary | ICD-10-CM | POA: Diagnosis not present

## 2021-11-19 DIAGNOSIS — M48061 Spinal stenosis, lumbar region without neurogenic claudication: Secondary | ICD-10-CM | POA: Diagnosis not present

## 2021-11-19 DIAGNOSIS — I2510 Atherosclerotic heart disease of native coronary artery without angina pectoris: Secondary | ICD-10-CM | POA: Diagnosis not present

## 2021-11-19 DIAGNOSIS — G8929 Other chronic pain: Secondary | ICD-10-CM | POA: Diagnosis not present

## 2021-11-19 DIAGNOSIS — Z87442 Personal history of urinary calculi: Secondary | ICD-10-CM | POA: Diagnosis not present

## 2021-11-19 DIAGNOSIS — Z4889 Encounter for other specified surgical aftercare: Secondary | ICD-10-CM | POA: Diagnosis not present

## 2021-11-19 DIAGNOSIS — M5416 Radiculopathy, lumbar region: Secondary | ICD-10-CM | POA: Diagnosis not present

## 2021-11-19 DIAGNOSIS — M6259 Muscle wasting and atrophy, not elsewhere classified, multiple sites: Secondary | ICD-10-CM | POA: Diagnosis not present

## 2021-11-19 DIAGNOSIS — E569 Vitamin deficiency, unspecified: Secondary | ICD-10-CM | POA: Diagnosis not present

## 2021-11-19 DIAGNOSIS — R41841 Cognitive communication deficit: Secondary | ICD-10-CM | POA: Diagnosis not present

## 2021-11-23 DIAGNOSIS — G8929 Other chronic pain: Secondary | ICD-10-CM | POA: Diagnosis not present

## 2021-11-23 DIAGNOSIS — M5416 Radiculopathy, lumbar region: Secondary | ICD-10-CM | POA: Diagnosis not present

## 2021-11-23 DIAGNOSIS — Z4889 Encounter for other specified surgical aftercare: Secondary | ICD-10-CM | POA: Diagnosis not present

## 2021-11-23 DIAGNOSIS — M4986 Spondylopathy in diseases classified elsewhere, lumbar region: Secondary | ICD-10-CM | POA: Diagnosis not present

## 2021-11-23 DIAGNOSIS — M48061 Spinal stenosis, lumbar region without neurogenic claudication: Secondary | ICD-10-CM | POA: Diagnosis not present

## 2021-11-23 DIAGNOSIS — M5126 Other intervertebral disc displacement, lumbar region: Secondary | ICD-10-CM | POA: Diagnosis not present

## 2021-12-01 DIAGNOSIS — Z4889 Encounter for other specified surgical aftercare: Secondary | ICD-10-CM | POA: Diagnosis not present

## 2021-12-01 DIAGNOSIS — M5126 Other intervertebral disc displacement, lumbar region: Secondary | ICD-10-CM | POA: Diagnosis not present

## 2021-12-01 DIAGNOSIS — M48061 Spinal stenosis, lumbar region without neurogenic claudication: Secondary | ICD-10-CM | POA: Diagnosis not present

## 2021-12-01 DIAGNOSIS — G8929 Other chronic pain: Secondary | ICD-10-CM | POA: Diagnosis not present

## 2021-12-01 DIAGNOSIS — M5416 Radiculopathy, lumbar region: Secondary | ICD-10-CM | POA: Diagnosis not present

## 2021-12-01 DIAGNOSIS — M4986 Spondylopathy in diseases classified elsewhere, lumbar region: Secondary | ICD-10-CM | POA: Diagnosis not present

## 2021-12-02 DIAGNOSIS — M5416 Radiculopathy, lumbar region: Secondary | ICD-10-CM | POA: Diagnosis not present

## 2021-12-02 DIAGNOSIS — M5126 Other intervertebral disc displacement, lumbar region: Secondary | ICD-10-CM | POA: Diagnosis not present

## 2021-12-02 DIAGNOSIS — Z4889 Encounter for other specified surgical aftercare: Secondary | ICD-10-CM | POA: Diagnosis not present

## 2021-12-02 DIAGNOSIS — M4986 Spondylopathy in diseases classified elsewhere, lumbar region: Secondary | ICD-10-CM | POA: Diagnosis not present

## 2021-12-02 DIAGNOSIS — M48061 Spinal stenosis, lumbar region without neurogenic claudication: Secondary | ICD-10-CM | POA: Diagnosis not present

## 2021-12-02 DIAGNOSIS — G8929 Other chronic pain: Secondary | ICD-10-CM | POA: Diagnosis not present

## 2021-12-08 DIAGNOSIS — M4986 Spondylopathy in diseases classified elsewhere, lumbar region: Secondary | ICD-10-CM | POA: Diagnosis not present

## 2021-12-08 DIAGNOSIS — M5416 Radiculopathy, lumbar region: Secondary | ICD-10-CM | POA: Diagnosis not present

## 2021-12-08 DIAGNOSIS — M48061 Spinal stenosis, lumbar region without neurogenic claudication: Secondary | ICD-10-CM | POA: Diagnosis not present

## 2021-12-08 DIAGNOSIS — M5126 Other intervertebral disc displacement, lumbar region: Secondary | ICD-10-CM | POA: Diagnosis not present

## 2021-12-08 DIAGNOSIS — Z4889 Encounter for other specified surgical aftercare: Secondary | ICD-10-CM | POA: Diagnosis not present

## 2021-12-08 DIAGNOSIS — G8929 Other chronic pain: Secondary | ICD-10-CM | POA: Diagnosis not present

## 2021-12-14 DIAGNOSIS — M48061 Spinal stenosis, lumbar region without neurogenic claudication: Secondary | ICD-10-CM | POA: Diagnosis not present

## 2021-12-14 DIAGNOSIS — M4986 Spondylopathy in diseases classified elsewhere, lumbar region: Secondary | ICD-10-CM | POA: Diagnosis not present

## 2021-12-14 DIAGNOSIS — G8929 Other chronic pain: Secondary | ICD-10-CM | POA: Diagnosis not present

## 2021-12-14 DIAGNOSIS — M5126 Other intervertebral disc displacement, lumbar region: Secondary | ICD-10-CM | POA: Diagnosis not present

## 2021-12-14 DIAGNOSIS — M5416 Radiculopathy, lumbar region: Secondary | ICD-10-CM | POA: Diagnosis not present

## 2021-12-14 DIAGNOSIS — Z4889 Encounter for other specified surgical aftercare: Secondary | ICD-10-CM | POA: Diagnosis not present

## 2021-12-16 DIAGNOSIS — M5416 Radiculopathy, lumbar region: Secondary | ICD-10-CM | POA: Diagnosis not present

## 2021-12-16 DIAGNOSIS — M5126 Other intervertebral disc displacement, lumbar region: Secondary | ICD-10-CM | POA: Diagnosis not present

## 2021-12-16 DIAGNOSIS — M4986 Spondylopathy in diseases classified elsewhere, lumbar region: Secondary | ICD-10-CM | POA: Diagnosis not present

## 2021-12-16 DIAGNOSIS — M48061 Spinal stenosis, lumbar region without neurogenic claudication: Secondary | ICD-10-CM | POA: Diagnosis not present

## 2021-12-16 DIAGNOSIS — Z4889 Encounter for other specified surgical aftercare: Secondary | ICD-10-CM | POA: Diagnosis not present

## 2021-12-16 DIAGNOSIS — G8929 Other chronic pain: Secondary | ICD-10-CM | POA: Diagnosis not present

## 2021-12-19 DIAGNOSIS — Z7982 Long term (current) use of aspirin: Secondary | ICD-10-CM | POA: Diagnosis not present

## 2021-12-19 DIAGNOSIS — Z87442 Personal history of urinary calculi: Secondary | ICD-10-CM | POA: Diagnosis not present

## 2021-12-19 DIAGNOSIS — M4986 Spondylopathy in diseases classified elsewhere, lumbar region: Secondary | ICD-10-CM | POA: Diagnosis not present

## 2021-12-19 DIAGNOSIS — M1000 Idiopathic gout, unspecified site: Secondary | ICD-10-CM | POA: Diagnosis not present

## 2021-12-19 DIAGNOSIS — I10 Essential (primary) hypertension: Secondary | ICD-10-CM | POA: Diagnosis not present

## 2021-12-19 DIAGNOSIS — M6259 Muscle wasting and atrophy, not elsewhere classified, multiple sites: Secondary | ICD-10-CM | POA: Diagnosis not present

## 2021-12-19 DIAGNOSIS — M5116 Intervertebral disc disorders with radiculopathy, lumbar region: Secondary | ICD-10-CM | POA: Diagnosis not present

## 2021-12-19 DIAGNOSIS — Z9181 History of falling: Secondary | ICD-10-CM | POA: Diagnosis not present

## 2021-12-19 DIAGNOSIS — G8929 Other chronic pain: Secondary | ICD-10-CM | POA: Diagnosis not present

## 2021-12-19 DIAGNOSIS — C642 Malignant neoplasm of left kidney, except renal pelvis: Secondary | ICD-10-CM | POA: Diagnosis not present

## 2021-12-19 DIAGNOSIS — I2510 Atherosclerotic heart disease of native coronary artery without angina pectoris: Secondary | ICD-10-CM | POA: Diagnosis not present

## 2021-12-19 DIAGNOSIS — G5603 Carpal tunnel syndrome, bilateral upper limbs: Secondary | ICD-10-CM | POA: Diagnosis not present

## 2021-12-19 DIAGNOSIS — E569 Vitamin deficiency, unspecified: Secondary | ICD-10-CM | POA: Diagnosis not present

## 2021-12-19 DIAGNOSIS — R41841 Cognitive communication deficit: Secondary | ICD-10-CM | POA: Diagnosis not present

## 2021-12-19 DIAGNOSIS — Z8546 Personal history of malignant neoplasm of prostate: Secondary | ICD-10-CM | POA: Diagnosis not present

## 2021-12-19 DIAGNOSIS — Z791 Long term (current) use of non-steroidal anti-inflammatories (NSAID): Secondary | ICD-10-CM | POA: Diagnosis not present

## 2021-12-19 DIAGNOSIS — H9190 Unspecified hearing loss, unspecified ear: Secondary | ICD-10-CM | POA: Diagnosis not present

## 2021-12-19 DIAGNOSIS — M1380 Other specified arthritis, unspecified site: Secondary | ICD-10-CM | POA: Diagnosis not present

## 2021-12-19 DIAGNOSIS — M48061 Spinal stenosis, lumbar region without neurogenic claudication: Secondary | ICD-10-CM | POA: Diagnosis not present

## 2021-12-19 DIAGNOSIS — E785 Hyperlipidemia, unspecified: Secondary | ICD-10-CM | POA: Diagnosis not present

## 2021-12-19 DIAGNOSIS — Z9889 Other specified postprocedural states: Secondary | ICD-10-CM | POA: Diagnosis not present

## 2021-12-19 DIAGNOSIS — Z4889 Encounter for other specified surgical aftercare: Secondary | ICD-10-CM | POA: Diagnosis not present

## 2021-12-20 DIAGNOSIS — M4986 Spondylopathy in diseases classified elsewhere, lumbar region: Secondary | ICD-10-CM | POA: Diagnosis not present

## 2021-12-20 DIAGNOSIS — M5116 Intervertebral disc disorders with radiculopathy, lumbar region: Secondary | ICD-10-CM | POA: Diagnosis not present

## 2021-12-20 DIAGNOSIS — M48061 Spinal stenosis, lumbar region without neurogenic claudication: Secondary | ICD-10-CM | POA: Diagnosis not present

## 2021-12-20 DIAGNOSIS — G8929 Other chronic pain: Secondary | ICD-10-CM | POA: Diagnosis not present

## 2021-12-20 DIAGNOSIS — M6259 Muscle wasting and atrophy, not elsewhere classified, multiple sites: Secondary | ICD-10-CM | POA: Diagnosis not present

## 2021-12-20 DIAGNOSIS — M1380 Other specified arthritis, unspecified site: Secondary | ICD-10-CM | POA: Diagnosis not present

## 2021-12-21 DIAGNOSIS — M4986 Spondylopathy in diseases classified elsewhere, lumbar region: Secondary | ICD-10-CM | POA: Diagnosis not present

## 2021-12-21 DIAGNOSIS — G8929 Other chronic pain: Secondary | ICD-10-CM | POA: Diagnosis not present

## 2021-12-21 DIAGNOSIS — M48061 Spinal stenosis, lumbar region without neurogenic claudication: Secondary | ICD-10-CM | POA: Diagnosis not present

## 2021-12-21 DIAGNOSIS — M6259 Muscle wasting and atrophy, not elsewhere classified, multiple sites: Secondary | ICD-10-CM | POA: Diagnosis not present

## 2021-12-21 DIAGNOSIS — M5116 Intervertebral disc disorders with radiculopathy, lumbar region: Secondary | ICD-10-CM | POA: Diagnosis not present

## 2021-12-21 DIAGNOSIS — M1380 Other specified arthritis, unspecified site: Secondary | ICD-10-CM | POA: Diagnosis not present

## 2021-12-22 DIAGNOSIS — M4986 Spondylopathy in diseases classified elsewhere, lumbar region: Secondary | ICD-10-CM | POA: Diagnosis not present

## 2021-12-22 DIAGNOSIS — G8929 Other chronic pain: Secondary | ICD-10-CM | POA: Diagnosis not present

## 2021-12-22 DIAGNOSIS — M1380 Other specified arthritis, unspecified site: Secondary | ICD-10-CM | POA: Diagnosis not present

## 2021-12-22 DIAGNOSIS — M6259 Muscle wasting and atrophy, not elsewhere classified, multiple sites: Secondary | ICD-10-CM | POA: Diagnosis not present

## 2021-12-22 DIAGNOSIS — M48061 Spinal stenosis, lumbar region without neurogenic claudication: Secondary | ICD-10-CM | POA: Diagnosis not present

## 2021-12-22 DIAGNOSIS — M5116 Intervertebral disc disorders with radiculopathy, lumbar region: Secondary | ICD-10-CM | POA: Diagnosis not present

## 2021-12-27 DIAGNOSIS — M4986 Spondylopathy in diseases classified elsewhere, lumbar region: Secondary | ICD-10-CM | POA: Diagnosis not present

## 2021-12-27 DIAGNOSIS — M6259 Muscle wasting and atrophy, not elsewhere classified, multiple sites: Secondary | ICD-10-CM | POA: Diagnosis not present

## 2021-12-27 DIAGNOSIS — M1380 Other specified arthritis, unspecified site: Secondary | ICD-10-CM | POA: Diagnosis not present

## 2021-12-27 DIAGNOSIS — M5116 Intervertebral disc disorders with radiculopathy, lumbar region: Secondary | ICD-10-CM | POA: Diagnosis not present

## 2021-12-27 DIAGNOSIS — G8929 Other chronic pain: Secondary | ICD-10-CM | POA: Diagnosis not present

## 2021-12-27 DIAGNOSIS — M48061 Spinal stenosis, lumbar region without neurogenic claudication: Secondary | ICD-10-CM | POA: Diagnosis not present

## 2021-12-30 DIAGNOSIS — M6259 Muscle wasting and atrophy, not elsewhere classified, multiple sites: Secondary | ICD-10-CM | POA: Diagnosis not present

## 2021-12-30 DIAGNOSIS — G8929 Other chronic pain: Secondary | ICD-10-CM | POA: Diagnosis not present

## 2021-12-30 DIAGNOSIS — M4986 Spondylopathy in diseases classified elsewhere, lumbar region: Secondary | ICD-10-CM | POA: Diagnosis not present

## 2021-12-30 DIAGNOSIS — M5116 Intervertebral disc disorders with radiculopathy, lumbar region: Secondary | ICD-10-CM | POA: Diagnosis not present

## 2021-12-30 DIAGNOSIS — M48061 Spinal stenosis, lumbar region without neurogenic claudication: Secondary | ICD-10-CM | POA: Diagnosis not present

## 2021-12-30 DIAGNOSIS — M1380 Other specified arthritis, unspecified site: Secondary | ICD-10-CM | POA: Diagnosis not present

## 2021-12-31 ENCOUNTER — Other Ambulatory Visit: Payer: Self-pay

## 2021-12-31 ENCOUNTER — Emergency Department: Payer: Medicare Other

## 2021-12-31 ENCOUNTER — Encounter: Payer: Self-pay | Admitting: Emergency Medicine

## 2021-12-31 ENCOUNTER — Emergency Department
Admission: EM | Admit: 2021-12-31 | Discharge: 2022-01-03 | Disposition: A | Discharge status: Home / Self Care | Payer: Medicare Other | Attending: Emergency Medicine | Admitting: Emergency Medicine

## 2021-12-31 DIAGNOSIS — R059 Cough, unspecified: Secondary | ICD-10-CM | POA: Insufficient documentation

## 2021-12-31 DIAGNOSIS — I2510 Atherosclerotic heart disease of native coronary artery without angina pectoris: Secondary | ICD-10-CM | POA: Diagnosis not present

## 2021-12-31 DIAGNOSIS — R296 Repeated falls: Secondary | ICD-10-CM | POA: Diagnosis not present

## 2021-12-31 DIAGNOSIS — W19XXXA Unspecified fall, initial encounter: Secondary | ICD-10-CM | POA: Diagnosis not present

## 2021-12-31 DIAGNOSIS — I517 Cardiomegaly: Secondary | ICD-10-CM | POA: Diagnosis not present

## 2021-12-31 DIAGNOSIS — I119 Hypertensive heart disease without heart failure: Secondary | ICD-10-CM | POA: Diagnosis not present

## 2021-12-31 DIAGNOSIS — R2681 Unsteadiness on feet: Secondary | ICD-10-CM | POA: Diagnosis not present

## 2021-12-31 DIAGNOSIS — Z9181 History of falling: Secondary | ICD-10-CM | POA: Diagnosis not present

## 2021-12-31 DIAGNOSIS — R531 Weakness: Secondary | ICD-10-CM

## 2021-12-31 DIAGNOSIS — M79606 Pain in leg, unspecified: Secondary | ICD-10-CM | POA: Diagnosis not present

## 2021-12-31 DIAGNOSIS — Z20822 Contact with and (suspected) exposure to covid-19: Secondary | ICD-10-CM | POA: Insufficient documentation

## 2021-12-31 DIAGNOSIS — R52 Pain, unspecified: Secondary | ICD-10-CM | POA: Diagnosis not present

## 2021-12-31 DIAGNOSIS — I10 Essential (primary) hypertension: Secondary | ICD-10-CM | POA: Diagnosis not present

## 2021-12-31 LAB — BASIC METABOLIC PANEL
Anion gap: 11 (ref 5–15)
BUN: 19 mg/dL (ref 8–23)
CO2: 24 mmol/L (ref 22–32)
Calcium: 8.4 mg/dL — ABNORMAL LOW (ref 8.9–10.3)
Chloride: 107 mmol/L (ref 98–111)
Creatinine, Ser: 0.84 mg/dL (ref 0.61–1.24)
GFR, Estimated: >60 mL/min (ref >60)
Glucose, Bld: 117 mg/dL — ABNORMAL HIGH (ref 70–99)
Potassium: 3.4 mmol/L — ABNORMAL LOW (ref 3.5–5.1)
Sodium: 142 mmol/L (ref 135–145)

## 2021-12-31 LAB — CBC
HCT: 39.1 % (ref 39.0–52.0)
Hemoglobin: 13.3 g/dL (ref 13.0–17.0)
MCH: 32.1 pg (ref 26.0–34.0)
MCHC: 34 g/dL (ref 30.0–36.0)
MCV: 94.4 fL (ref 80.0–100.0)
Platelets: 153 10*3/uL (ref 150–400)
RBC: 4.14 MIL/uL — ABNORMAL LOW (ref 4.22–5.81)
RDW: 13 % (ref 11.5–15.5)
WBC: 6.2 10*3/uL (ref 4.0–10.5)
nRBC: 0 % (ref 0.0–0.2)

## 2021-12-31 LAB — RESP PANEL BY RT-PCR (FLU A&B, COVID) ARPGX2
Influenza A by PCR: NEGATIVE
Influenza B by PCR: NEGATIVE
SARS Coronavirus 2 by RT PCR: NEGATIVE

## 2021-12-31 LAB — TROPONIN I (HIGH SENSITIVITY): Troponin I (High Sensitivity): 6 ng/L (ref <18)

## 2021-12-31 IMAGING — CR DG CHEST 2V
3 series · 3 of 3 positions shown · non-contrast
Comparison: [DATE]

CLINICAL DATA: Weakness.  Several recent falls.

EXAM:
CHEST - 2 VIEW

[chest lat (1 of 2)]
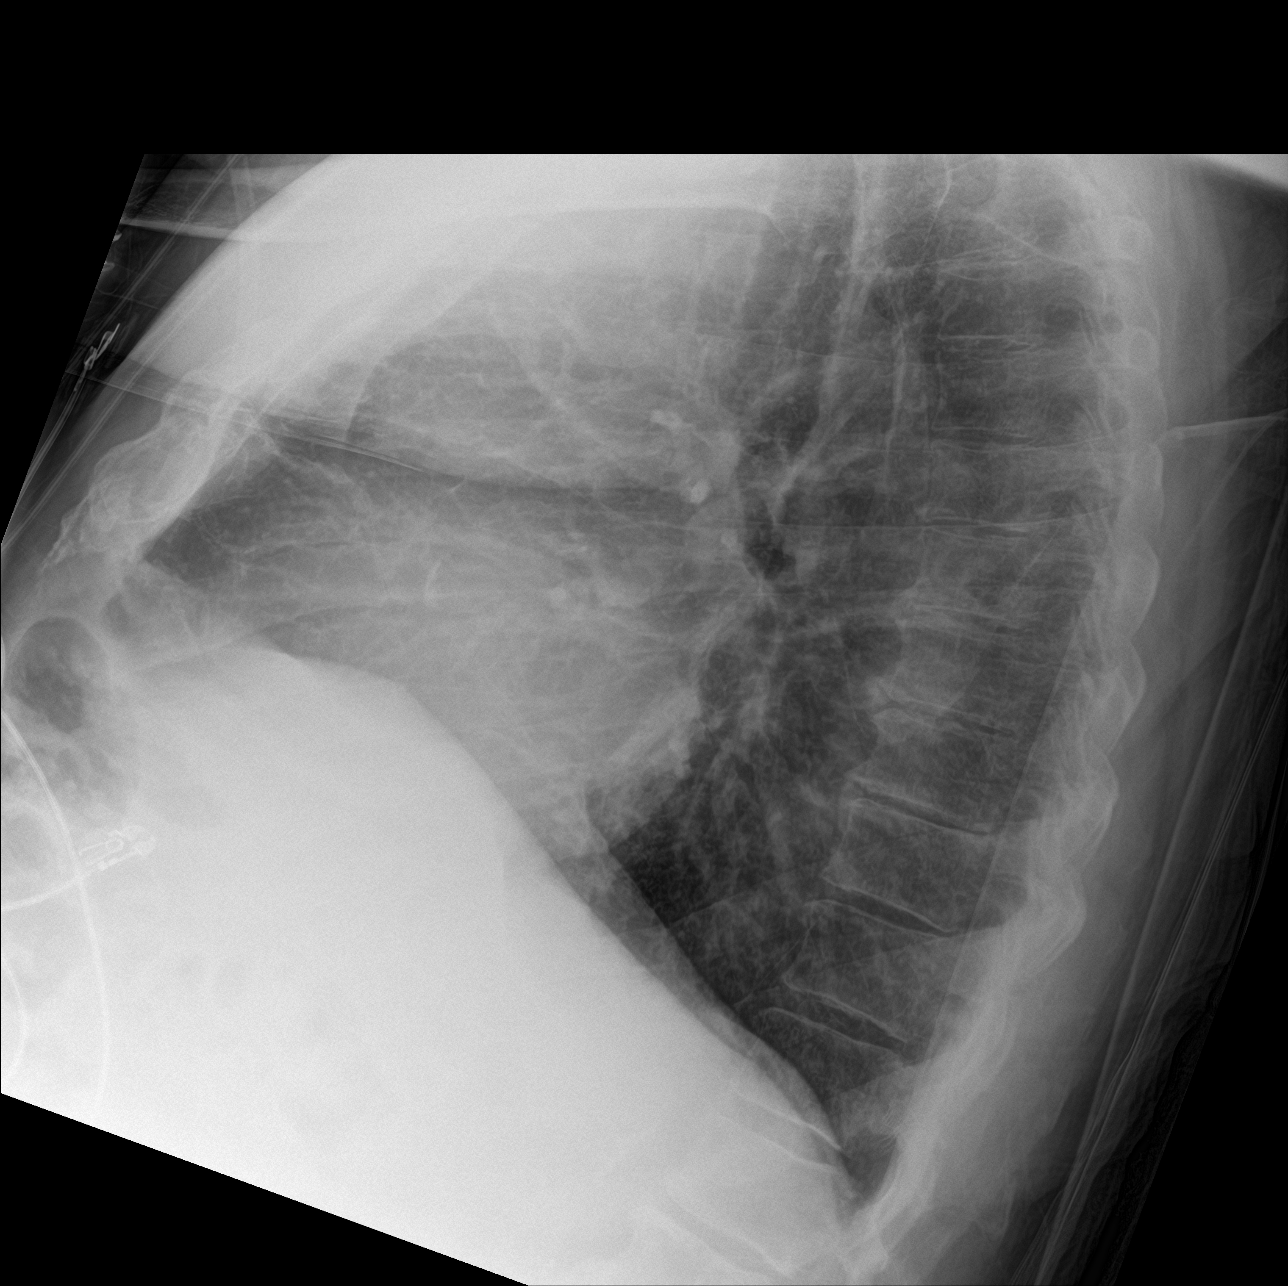

[chest ap]
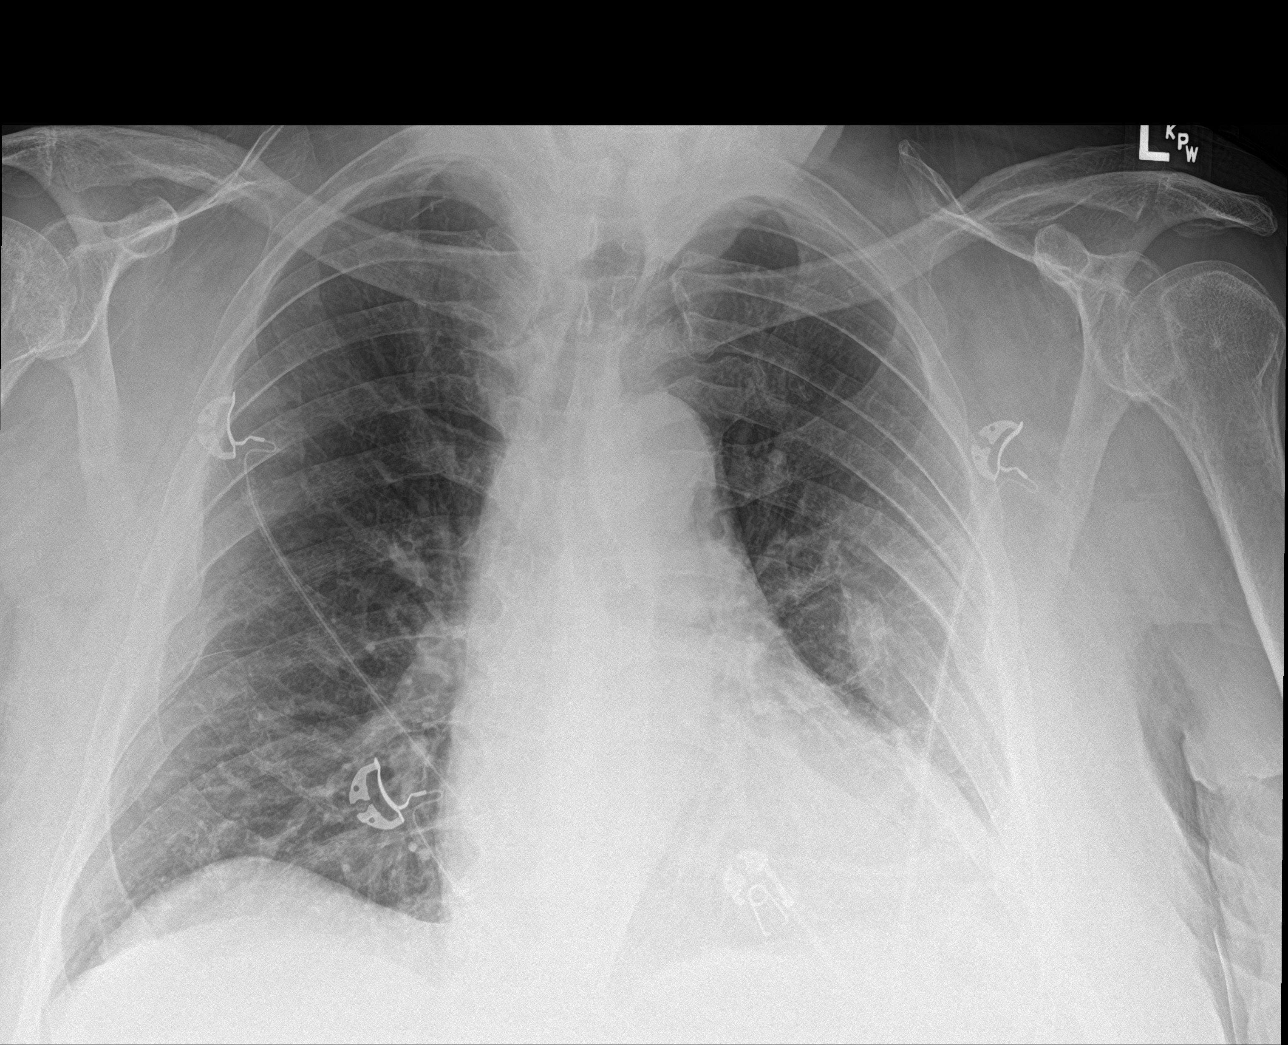

[chest lat (2 of 2)]
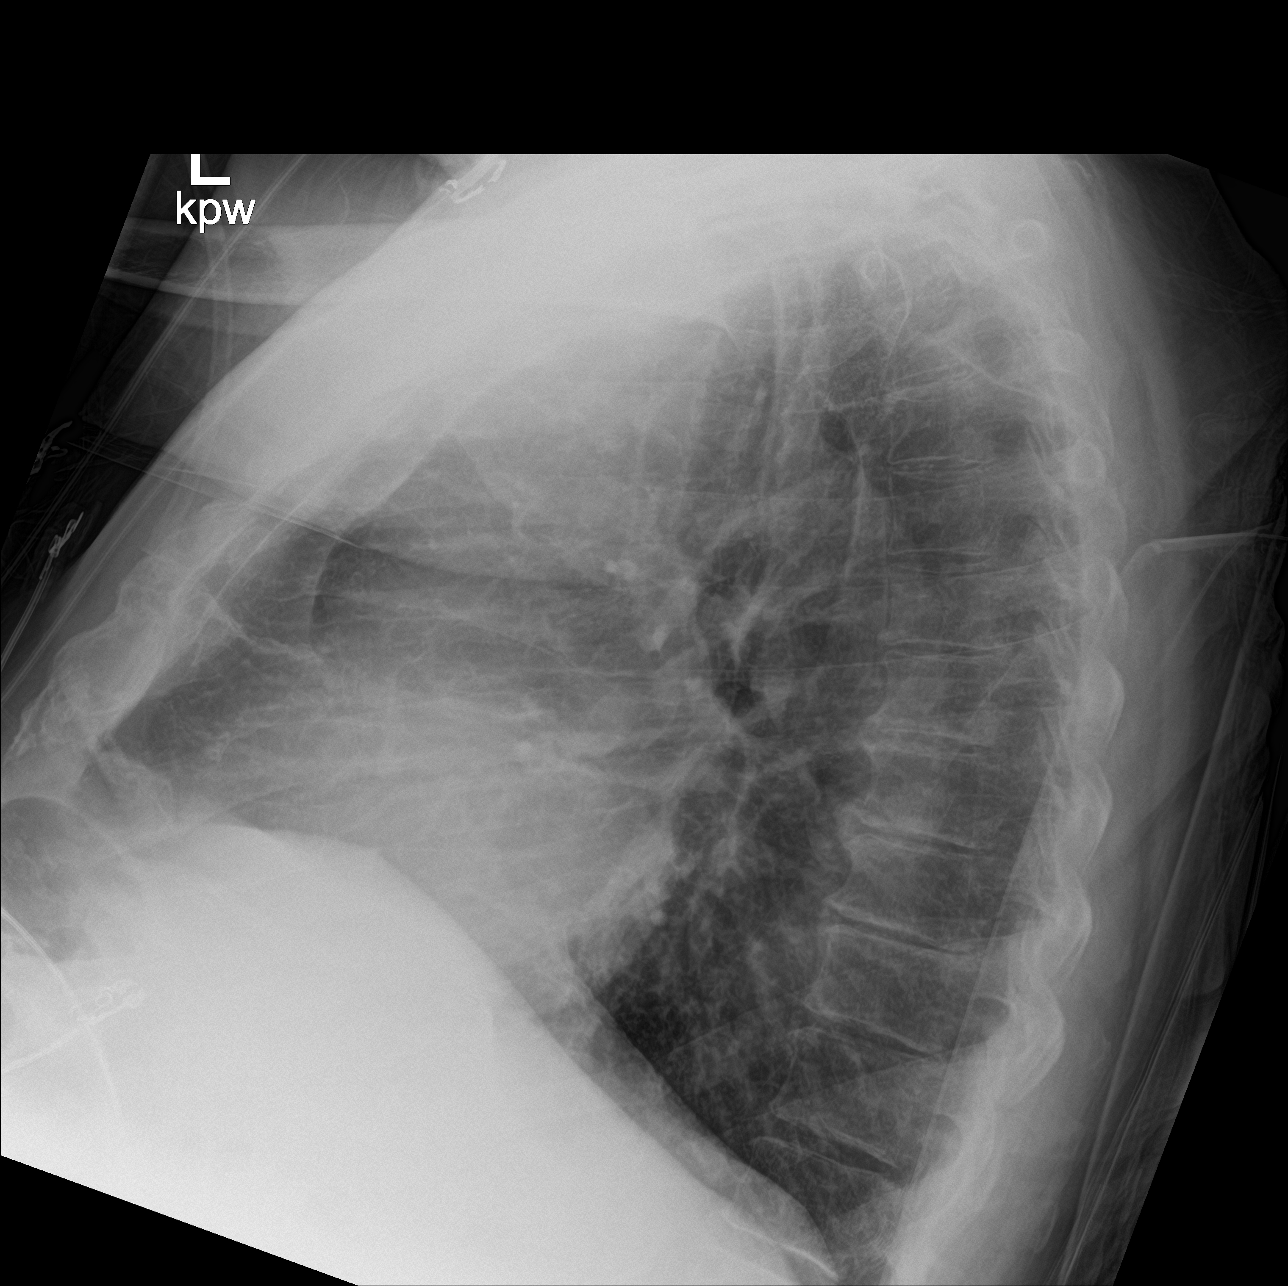

[3 of 3 positions shown; findings below may reference images not displayed]

FINDINGS: Stable mild cardiomegaly. Mild scarring again seen in the left mid
and lower lung. Lungs are otherwise clear. Old bilateral rib
fracture deformities again noted.
IMPRESSION: Mild cardiomegaly and left lung scarring. No active disease.

## 2021-12-31 MED ORDER — ASCORBIC ACID 500 MG PO TABS
1000.0000 mg | ORAL_TABLET | Freq: Every day | ORAL | Status: DC
  Administered 2021-12-31 – 2022-01-03 (×4): 1000 mg via ORAL
  Filled 2021-12-31 (×4): qty 2

## 2021-12-31 MED ORDER — RAMIPRIL 10 MG PO CAPS
10.0000 mg | ORAL_CAPSULE | Freq: Every day | ORAL | Status: DC
  Administered 2021-12-31 – 2022-01-03 (×3): 10 mg via ORAL
  Filled 2021-12-31 (×4): qty 1

## 2021-12-31 MED ORDER — METOPROLOL SUCCINATE ER 50 MG PO TB24
75.0000 mg | ORAL_TABLET | Freq: Every morning | ORAL | Status: DC
  Administered 2021-12-31 – 2022-01-03 (×4): 75 mg via ORAL
  Filled 2021-12-31 (×4): qty 2

## 2021-12-31 MED ORDER — ATORVASTATIN CALCIUM 20 MG PO TABS
80.0000 mg | ORAL_TABLET | Freq: Every evening | ORAL | Status: DC
  Administered 2021-12-31 – 2022-01-02 (×3): 80 mg via ORAL
  Filled 2021-12-31 (×3): qty 4

## 2021-12-31 MED ORDER — POTASSIUM CHLORIDE CRYS ER 20 MEQ PO TBCR
20.0000 meq | EXTENDED_RELEASE_TABLET | Freq: Once | ORAL | Status: AC
  Administered 2021-12-31: 20 meq via ORAL
  Filled 2021-12-31: qty 1

## 2021-12-31 MED ORDER — ASPIRIN 81 MG PO CHEW
81.0000 mg | CHEWABLE_TABLET | Freq: Every day | ORAL | Status: DC
  Administered 2021-12-31 – 2022-01-03 (×4): 81 mg via ORAL
  Filled 2021-12-31 (×4): qty 1

## 2021-12-31 MED ORDER — AMLODIPINE BESYLATE 5 MG PO TABS
2.5000 mg | ORAL_TABLET | Freq: Every day | ORAL | Status: DC
  Administered 2021-12-31 – 2022-01-03 (×3): 2.5 mg via ORAL
  Filled 2021-12-31 (×3): qty 1

## 2021-12-31 MED ORDER — ALLOPURINOL 100 MG PO TABS
200.0000 mg | ORAL_TABLET | Freq: Every evening | ORAL | Status: DC
  Administered 2021-12-31 – 2022-01-02 (×3): 200 mg via ORAL
  Filled 2021-12-31 (×5): qty 2

## 2021-12-31 NOTE — NC FL2 (Signed)
[place] LEVEL OF CARE SCREENING TOOL     IDENTIFICATION  Patient Name: Sean [name] Birthdate: 1946/03/15 Sex: male Admission Date (Current Location): 12/31/2021  [facility] and [place] Number:  Engineering geologist and Address:  [facility], [address] [place], [place] [address]      Provider Number: [id number]  Attending Physician Name and Address:  No att. providers found  Relative Name and Phone Number:  [place], [place], [phone]    Current Level of Care: Hospital Recommended Level of Care: [place] Prior Approval Number:    Date Approved/Denied:   PASRR Number: [id number]  Discharge Plan: SNF    Current Diagnoses: Patient Active Problem List   Diagnosis Date Noted   HNP (herniated nucleus pulposus), lumbar 09/24/2021   Lumbar disc herniation 09/22/2021   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   HYPERLIPIDEMIA 11/26/2009   Essential hypertension 11/26/2009   Coronary atherosclerosis 11/26/2009    Orientation RESPIRATION BLADDER Height & Weight     Self, Time, Situation, Place  Normal Continent Weight: 116.1 kg Height:  5\' 10"  (177.8 cm)  BEHAVIORAL SYMPTOMS/MOOD NEUROLOGICAL BOWEL NUTRITION STATUS      Continent Diet (Regular)  AMBULATORY STATUS COMMUNICATION OF NEEDS Skin   Extensive Assist Verbally Bruising (shoulders and legs)                       Personal Care Assistance Level of Assistance  Bathing, Feeding, Dressing Bathing Assistance: Limited assistance Feeding assistance: Independent Dressing Assistance: Limited assistance     Functional Limitations Info  Sight, Hearing, Speech Sight Info: Adequate Hearing Info: Adequate Speech Info: Adequate    SPECIAL CARE FACTORS FREQUENCY  PT (By licensed PT), OT (By licensed OT)     PT Frequency: 5 times per week OT Frequency: 5 times per week            Contractures Contractures Info: Not present    Additional  Factors Info  Code Status, Allergies Code Status Info: Full Allergies Info: MOrphine, Codeine, lariam           Current Medications (12/31/2021):  This is the current hospital active medication list Current Facility-Administered Medications  Medication Dose Route Frequency Provider Last Rate Last Admin   allopurinol (ZYLOPRIM) tablet 200 mg  200 mg Oral QPM Blake Divine, MD       amLODipine (NORVASC) tablet 2.5 mg  2.5 mg Oral Daily Blake Divine, MD       ascorbic acid (VITAMIN C) tablet 1,000 mg  1,000 mg Oral Daily Blake Divine, MD       aspirin chewable tablet 81 mg  81 mg Oral Daily Blake Divine, MD       atorvastatin (LIPITOR) tablet 80 mg  80 mg Oral QPM Blake Divine, MD       metoprolol succinate (TOPROL-XL) 24 hr tablet 75 mg  75 mg Oral q morning Blake Divine, MD       ramipril (ALTACE) capsule 10 mg  10 mg Oral Daily Blake Divine, MD       Current Outpatient Medications  Medication Sig Dispense Refill   allopurinol (ZYLOPRIM) 100 MG tablet Take 200 mg by mouth every evening.      amLODipine (NORVASC) 2.5 MG tablet Take 2.5 mg by mouth daily.     APPLE CIDER VINEGAR PO Take 450 mg by mouth daily.     Ascorbic Acid (VITAMIN C) 1000 MG tablet Take 2,000 mg by mouth  daily.     aspirin 81 MG chewable tablet Chew 81 mg by mouth daily.     atorvastatin (LIPITOR) 80 MG tablet Take 80 mg by mouth every evening.      Cinnamon 500 MG TABS Take 1,000 mg by mouth daily.     Ginger, Zingiber officinalis, (GINGER ROOT) 550 MG CAPS Take 550 mg by mouth daily.     ibuprofen (ADVIL) 200 MG tablet Take 800 mg by mouth every 6 (six) hours as needed for mild pain.     metoprolol succinate (TOPROL-XL) 50 MG 24 hr tablet Take 75 mg by mouth every morning. Take with or immediately following a meal.     Misc Natural Products (TART CHERRY ADVANCED PO) Take 1,200 mg by mouth daily.     Multiple Vitamin (MULTIVITAMIN) tablet Take 1 tablet by mouth daily.     ramipril (ALTACE) 10 MG  capsule Take 10 mg by mouth daily.      Turmeric 500 MG CAPS Take 500 mg by mouth daily.     HYDROcodone-acetaminophen (NORCO/VICODIN) 5-325 MG tablet Take 1 tablet by mouth every 6 (six) hours as needed for moderate pain. 30 tablet 0     Discharge Medications: Please see discharge summary for a list of discharge medications.  Relevant Imaging Results:  Relevant Lab Results:   Additional Information SSN: [id number], pfizer 01/04/20, 01/27/20  Shelbie Hutching, RN

## 2021-12-31 NOTE — TOC Initial Note (Signed)
Transition of Care [facility]) - Initial/Assessment Note    Patient Details  Name: Sean [name] MRN: [id number] Date of Birth: 1945-12-14  Transition of Care [facility]) CM/SW Contact:    Shelbie Hutching, RN Phone Number: 12/31/2021, 4:02 PM  Clinical Narrative:                 Patient came into the emergency room today for progressive weakness, he was so weak today he could not walk and his legs are tremulous standing.   Patient was a [facility] for abut 4 weeks last year and has been living with his brother for about a month.  He was walking with a walker or rollator at home and having home health come out a couple times a week.   Patients want to be able to walk and agrees for more rehab.  He reports that if he is in copay days they will pay the copay's it will not be a problem.   Patient reports [facility] was okay but he didn't like the food- 1st preference is [facility].   Bed search started.     Expected Discharge Plan: Skilled Nursing Facility Barriers to Discharge: SNF Pending bed offer   Patient Goals and CMS Choice Patient states their goals for this hospitalization and ongoing recovery are:: Wants to be able to walk, agrees to SNF CMS Medicare.gov Compare Post Acute Care list provided to:: Patient Choice offered to / list presented to : Patient  Expected Discharge Plan and Services Expected Discharge Plan: [place]   Discharge Planning Services: CM Consult Post Acute Care Choice: [place] Living arrangements for the past 2 months: [place], [place]                 DME Arranged: N/A DME Agency: NA       HH Arranged: NA HH Agency: NA        Prior Living Arrangements/Services Living arrangements for the past 2 months: [place], [place] Lives with:: Siblings Patient language and need for interpreter reviewed:: Yes Do you feel safe going back to the place where you live?: Yes      Need for  Family Participation in Patient Care: Yes (Comment) Care giver support system in place?: Yes (comment) (brother and sister in law) Current home services: DME (walker, rollator) Criminal Activity/Legal Involvement Pertinent to Current Situation/Hospitalization: No - Comment as needed  Activities of Daily Living      Permission Sought/Granted Permission sought to share information with : Case Manager, Family Supports, Customer service manager Permission granted to share information with : Yes, Verbal Permission Granted  Share Information with NAME: Phillips Goulette  Permission granted to share info w AGENCY: SNF's  Permission granted to share info w Relationship: brother  Permission granted to share info w Contact Information: [phone]  Emotional Assessment Appearance:: Appears stated age Attitude/Demeanor/Rapport: Engaged Affect (typically observed): Accepting Orientation: : Oriented to Self, Oriented to Place, Oriented to  Time, Oriented to Situation Alcohol / Substance Use: Not Applicable Psych Involvement: No (comment)  Admission diagnosis:  general weakness --ems Patient Active Problem List   Diagnosis Date Noted   HNP (herniated nucleus pulposus), lumbar 09/24/2021   Lumbar disc herniation 09/22/2021   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   HYPERLIPIDEMIA 11/26/2009   Essential hypertension 11/26/2009   Coronary atherosclerosis 11/26/2009   PCP:  Shon Baton, MD Pharmacy:   [id number] - [place], [place]. AT  [address] [place] [address] Phone: [phone] Fax: [phone]  [id number] [place] [place] - [address] [place] [address] Phone: [phone] Fax: [phone]     Social Determinants of Health (SDOH) Interventions    Readmission Risk Interventions No flowsheet data found.

## 2021-12-31 NOTE — Evaluation (Signed)
Physical Therapy Evaluation Patient Details Name: Sean [name] MRN: [id number] DOB: [birth year] Today's Date: 12/31/2021  History of Present Illness  Pt is a 76 y.o. male with past medical history of hypertension, hyperlipidemia, and CAD who presents to the ED complaining of generalized weakness.  Patient reports that he underwent lumbar microdiscectomy in November of last year for left leg numbness and weakness. MD assessment includes: generalized weakness and multiple falls.   Clinical Impression  Pt was pleasant and motivated to participate during the session and put forth good effort throughout. Pt with extensive recent fall history secondary to "knees giving out".  Pt with noted BLE trembling upon coming to standing and during limited, effortful steps at the EOB.  Pt is at a very high risk for future falls and would not be safe to return to his prior living situation at this time.  Pt will benefit from PT services in a SNF setting upon discharge to safely address deficits listed in patient problem list for decreased caregiver assistance and eventual return to PLOF.         Recommendations for follow up therapy are one component of a multi-disciplinary discharge planning process, led by the attending physician.  Recommendations may be updated based on patient status, additional functional criteria and insurance authorization.  Follow Up Recommendations Skilled nursing-short term rehab (<3 hours/day)    Assistance Recommended at Discharge Frequent or constant Supervision/Assistance  Patient can return home with the following  Two people to help with walking and/or transfers;Two people to help with bathing/dressing/bathroom;Assistance with cooking/housework;Direct supervision/assist for medications management;Assist for transportation;Help with stairs or ramp for entrance    Equipment Recommendations None recommended by PT  Recommendations for Other Services       Functional Status  Assessment Patient has had a recent decline in their functional status and/or demonstrates limited ability to make significant improvements in function in a reasonable and predictable amount of time     Precautions / Restrictions Precautions Precautions: Fall Restrictions Weight Bearing Restrictions: No      Mobility  Bed Mobility Overal bed mobility: Modified Independent             General bed mobility comments: Increased time and effort    Transfers Overall transfer level: Needs assistance Equipment used: Rolling walker (2 wheels) Transfers: Sit to/from Stand Sit to Stand: Min assist           General transfer comment: Min A and increased time and effort to come to standing with BLE's noted to be mildly tremulous upon coming to standing    Ambulation/Gait Ambulation/Gait assistance: Min guard Gait Distance (Feet): 2 Feet Assistive device: Rolling walker (2 wheels) Gait Pattern/deviations: Step-to pattern, Decreased step length - right, Decreased step length - left, Trunk flexed Gait velocity: decreased     General Gait Details: Effortful, shuffling steps with tremuloius BLEs with cues to stay closer to the RW for safety  Stairs            Wheelchair Mobility    Modified Rankin (Stroke Patients Only)       Balance Overall balance assessment: History of Falls, Needs assistance   Sitting balance-Leahy Scale: Fair     Standing balance support: Bilateral upper extremity supported, During functional activity Standing balance-Leahy Scale: Poor Standing balance comment: Heavy reliance on the RW for support  Pertinent Vitals/Pain Pain Assessment Pain Assessment: No/denies pain    Home Living Family/patient expects to be discharged to:: Private residence Living Arrangements: Other relatives Available Help at Discharge: Family;Available PRN/intermittently Type of Home: House Home Access: Ramped entrance        Home Layout: One level Home Equipment: Cane - single point;Wheelchair - manual;Wheelchair - Engineer, manufacturing systems (2 wheels);Rollator (4 wheels) Additional Comments: Pt lives with brother who is an amputee and sister-in-law who works during the day    Prior Function Prior Level of Function : Independent/Modified Independent             Mobility Comments: Mod Ind amb limited distances with a rollator, 6 falls in the last two weeks secondary to knee buckling ADLs Comments: Mod I with ADL completion with increased time and effort     Hand Dominance   Dominant Hand: Right    Extremity/Trunk Assessment   Upper Extremity Assessment Upper Extremity Assessment: Generalized weakness    Lower Extremity Assessment Lower Extremity Assessment: Generalized weakness       Communication   Communication: No difficulties  Cognition Arousal/Alertness: Awake/alert Behavior During Therapy: WFL for tasks assessed/performed Overall Cognitive Status: Within Functional Limits for tasks assessed                                          General Comments      Exercises     Assessment/Plan    PT Assessment Patient needs continued PT services  PT Problem List Decreased strength;Decreased activity tolerance;Decreased balance;Decreased mobility;Decreased knowledge of use of DME       PT Treatment Interventions DME instruction;Gait training;Functional mobility training;Therapeutic activities;Therapeutic exercise;Balance training;Patient/family education    PT Goals (Current goals can be found in the Care Plan section)  Acute Rehab PT Goals Patient Stated Goal: To be able to walk without falling PT Goal Formulation: With patient Time For Goal Achievement: 01/13/22 Potential to Achieve Goals: Fair    Frequency Min 2X/week     Co-evaluation               AM-PAC PT "6 Clicks" Mobility  Outcome Measure Help needed turning from your back to your side while in a  flat bed without using bedrails?: A Little Help needed moving from lying on your back to sitting on the side of a flat bed without using bedrails?: A Little Help needed moving to and from a bed to a chair (including a wheelchair)?: A Lot Help needed standing up from a chair using your arms (e.g., wheelchair or bedside chair)?: A Lot Help needed to walk in hospital room?: Total Help needed climbing 3-5 steps with a railing? : Total 6 Click Score: 12    End of Session Equipment Utilized During Treatment: Gait belt Activity Tolerance: Patient tolerated treatment well Patient left: in bed;with call bell/phone within reach;with family/visitor present;with nursing/sitter in room Nurse Communication: Mobility status PT Visit Diagnosis: Unsteadiness on feet (R26.81);History of falling (Z91.81);Repeated falls (R29.6);Muscle weakness (generalized) (M62.81);Difficulty in walking, not elsewhere classified (R26.2)    Time: 9326-7124 (and 14:48-14:58 with break for toileting) PT Time Calculation (min) (ACUTE ONLY): 12 min   Charges:   PT Evaluation $PT Eval Moderate Complexity: 1 Mod         D. Scott Derald Lorge PT, DPT 12/31/21, 4:13 PM

## 2021-12-31 NOTE — ED Notes (Signed)
Pt assisted back to bed from bedside commode.  Pt only able to stand and pivot with use of walker and became weak and out of breath.  Pt assisted back into bed, side rails up, bed in lowest position, call bell in reach.

## 2021-12-31 NOTE — ED Notes (Signed)
Pt given meal and water.  Assisted to sit up to eat.  GIven call bell to call when done

## 2021-12-31 NOTE — ED Provider Notes (Signed)
[facility] Provider Note    Event Date/Time   First MD Initiated Contact with Patient 12/31/21 [phone]     (approximate)   History   Chief Complaint Weakness   HPI  Sean [name] is a 76 y.o. male with past medical history of hypertension, hyperlipidemia, and CAD who presents to the ED complaining of generalized weakness.  Patient reports that he underwent lumbar microdiscectomy in November of last year for left leg numbness and weakness.  He had an extended rehab stay after that and about 1 month ago was discharged home to live with his brother.  He states that he continues to work with physical therapy but his strength in both legs has been gradually worsening over the past couple of weeks.  He states that his legs will frequently give out on him, causing him to fall to the ground.  He denies any pain in his back and denies any numbness in his legs.  He has not had any saddle anesthesia and denies any urinary retention or incontinence.  He denies any fevers, chest pain, shortness of breath, dysuria, abdominal pain, nausea, vomiting, or diarrhea.  He does endorse occasional cough that is nonproductive.  He states he has had a couple falls where the legs give out on him and he loses control of his walker, falling to the side.  He denies hitting his head or losing consciousness falling into these falls, denies any pain in either his arms or his legs.     Physical Exam   Triage Vital Signs: ED Triage Vitals  Enc Vitals Group     BP 12/31/21 0958 140/84     Pulse Rate 12/31/21 0958 62     Resp 12/31/21 0958 18     Temp 12/31/21 0958 98.1 F (36.7 C)     Temp Source 12/31/21 0958 Oral     SpO2 12/31/21 0958 97 %     Weight 12/31/21 0950 256 lb (116.1 kg)     Height 12/31/21 0950 5\' 10"  (1.778 m)     Head Circumference --      [facility] --      Pain Score 12/31/21 0950 8     Pain Loc --      Pain Edu? --      Excl. in [place]? --     Most recent vital  signs: Vitals:   12/31/21 1300 12/31/21 1330  BP: (!) 157/85 (!) 166/94  Pulse: 77 74  Resp:  16  Temp:    SpO2: 97% 94%    Constitutional: Alert and oriented. Eyes: Conjunctivae are normal. Head: Atraumatic. Nose: No congestion/rhinnorhea. Mouth/Throat: Mucous membranes are moist.  Cardiovascular: Normal rate, regular rhythm. Grossly normal heart sounds.  2+ DP pulses bilaterally. Respiratory: Normal respiratory effort.  No retractions. Lungs CTAB. Gastrointestinal: Soft and nontender. No distention. Musculoskeletal: No lower extremity tenderness nor edema.  Healing bruising noted to left shin.  No upper extremity bony tenderness to palpation noted.  No midline thoracic or lumbar spinal tenderness to palpation noted. Neurologic:  Normal speech and language. No gross focal neurologic deficits are appreciated.    ED Results / Procedures / Treatments   Labs (all labs ordered are listed, but only abnormal results are displayed) Labs Reviewed  BASIC METABOLIC PANEL - Abnormal; Notable for the following components:      Result Value   Potassium 3.4 (*)    Glucose, Bld 117 (*)    Calcium 8.4 (*)  All other components within normal limits  CBC - Abnormal; Notable for the following components:   RBC 4.14 (*)    All other components within normal limits  RESP PANEL BY RT-PCR (FLU A&B, COVID) ARPGX2  URINALYSIS, ROUTINE W REFLEX MICROSCOPIC  CBG MONITORING, ED  TROPONIN I (HIGH SENSITIVITY)     EKG  ED ECG REPORT I, Blake Divine, the attending physician, personally viewed and interpreted this ECG.   Date: 12/31/2021  EKG Time: 9:53  Rate: 64  Rhythm: normal sinus rhythm  Axis: Normal  Intervals:none  ST&T Change: None  RADIOLOGY Chest x-ray reviewed by me with no infiltrate, edema, or effusion.  PROCEDURES:  Critical Care performed: No  Procedures   MEDICATIONS ORDERED IN ED: Medications  amLODipine (NORVASC) tablet 2.5 mg (has no administration in time  range)  allopurinol (ZYLOPRIM) tablet 200 mg (has no administration in time range)  ascorbic acid (VITAMIN C) tablet 1,000 mg (has no administration in time range)  aspirin chewable tablet 81 mg (has no administration in time range)  atorvastatin (LIPITOR) tablet 80 mg (has no administration in time range)  metoprolol succinate (TOPROL-XL) 24 hr tablet 75 mg (has no administration in time range)  ramipril (ALTACE) capsule 10 mg (has no administration in time range)  potassium chloride SA (KLOR-CON M) CR tablet 20 mEq (20 mEq Oral Given 12/31/21 1111)     IMPRESSION / MDM / ASSESSMENT AND PLAN / ED COURSE  I reviewed the triage vital signs and the nursing notes.                              76 y.o. male with past medical history of hypertension, hyperlipidemia, CAD, and lumbar microdiscectomy who presents to the ED complaining of increasing generalized weakness causing his legs to give out on him on multiple occasions while attempting to ambulate with his walker.  Differential diagnosis includes, but is not limited to, cauda equina, other spinal compression, dehydration, electrolyte abnormality, anemia, ACS, UTI, pneumonia, viral syndrome, deconditioning.  Patient is nontoxic-appearing and in no acute distress, vital signs are unremarkable and he is neurovascularly intact to his bilateral lower extremities.  EKG shows no evidence of arrhythmia or ischemia and I have low suspicion for cardiac etiology for his weakness, we will screen troponin.  Also plan to check basic labs with CBC and BMP, screen for infectious process with UA and chest x-ray.  If these are unremarkable, we will attempt to have patient any late on a walker.  He has no focal neurologic deficits to suggest stroke and no exam findings to suggest cauda equina.  Labs are reassuring, BMP remarkable only for mild hypokalemia and CBC shows no anemia or leukocytosis.  Testing for COVID-19 and influenza is negative, chest x-ray is also  unremarkable.  UA is pending at this time but low suspicion for UTI given he lacks any urinary symptoms.  Given patient is unable to walk and has multiple falls at home, we will consult social work for potential placement, also have patient evaluated by physical therapy.      FINAL CLINICAL IMPRESSION(S) / ED DIAGNOSES   Final diagnoses:  Generalized weakness  Multiple falls     Rx / DC Orders   ED Discharge Orders     None        Note:  This document was prepared using Dragon voice recognition software and may include unintentional dictation errors.   Blake Divine, MD  12/31/21 1347 ° °

## 2021-12-31 NOTE — ED Triage Notes (Signed)
Pt has had progressive weakness for the past 2 weeks.  Uses walker.  Had back surgery in Nov.  Cannot hold himself up today.  Reports several falls recently.  Unable to stand with EMS.   Complaints of all over pain, especially in bilateral legs.

## 2022-01-01 DIAGNOSIS — R531 Weakness: Secondary | ICD-10-CM | POA: Diagnosis not present

## 2022-01-01 NOTE — ED Notes (Signed)
Breakfast tray given to pt 

## 2022-01-01 NOTE — ED Notes (Signed)
This RN checked pt and he was found to have a heavily soiled bed, chux and linen. Night shift RN states pt uses a bedside urinal and pt states "that is not true".   Full bed linen change, new brief placed and new warm blankets given as well.  Pt is a border pt and waiting for rehab placement so all monitoring equipment was taken off per pt request due to alarm fatigue, will spot check vitals per protocol. Pt is A&Ox4.

## 2022-01-01 NOTE — ED Notes (Signed)
Patients brief changed. Patient set up with lunch tray

## 2022-01-01 NOTE — ED Notes (Addendum)
This RN and Dorian RN did a full bed lien change on pt. Pericare was done and pt was placed on male purwick device. Pt was place in new hospital gown and blue non-skid socks. Pt was given a warm blanket.

## 2022-01-01 NOTE — ED Notes (Signed)
Pt resting comfortably at this time. Normal rise and fall of chest. Call bell in reach. NAD noted. VSS.

## 2022-01-01 NOTE — ED Provider Notes (Signed)
Today's Vitals   01/01/22 0430 01/01/22 0500 01/01/22 0530 01/01/22 0600  BP: (!) 118/59 135/90 (!) 144/82 129/77  Pulse:  75 79 72  Resp:  (!) 21  20  Temp:      TempSrc:      SpO2:  96% 96% 96%  Weight:      Height:      PainSc:       Body mass index is 36.73 kg/m.  Patient has been resting comfortably with no acute events overnight.  Awaiting social work disposition.   Christiana Gurevich, Delice Bison, DO 01/01/22 [phone]

## 2022-01-02 DIAGNOSIS — R531 Weakness: Secondary | ICD-10-CM | POA: Diagnosis not present

## 2022-01-02 LAB — URINALYSIS, ROUTINE W REFLEX MICROSCOPIC
Bacteria, UA: NONE SEEN
Bilirubin Urine: NEGATIVE
Glucose, UA: NEGATIVE mg/dL
Hgb urine dipstick: NEGATIVE
Ketones, ur: NEGATIVE mg/dL
Nitrite: NEGATIVE
Protein, ur: 100 mg/dL — AB
Specific Gravity, Urine: 1.023 (ref 1.005–1.030)
WBC, UA: >50 WBC/hpf — ABNORMAL HIGH (ref 0–5)
pH: 5 (ref 5.0–8.0)

## 2022-01-02 MED ORDER — CEPHALEXIN 500 MG PO CAPS: 500.0000 mg | ORAL_CAPSULE | Freq: Two times a day (BID) | ORAL | Status: DC

## 2022-01-02 MED ORDER — CEFDINIR 300 MG PO CAPS
300.0000 mg | ORAL_CAPSULE | Freq: Two times a day (BID) | ORAL | Status: DC
Start: 2022-01-02 — End: 2022-01-03
  Administered 2022-01-02 – 2022-01-03 (×3): 300 mg via ORAL
  Filled 2022-01-02 (×5): qty 1

## 2022-01-02 NOTE — ED Notes (Signed)
RN changed pt's bed linen and brief. Pericare was done. Pt given warm blanket.

## 2022-01-02 NOTE — ED Notes (Signed)
Pt is Alert and resting at this time. Respirations are even and unlabored. Pt is NAD.

## 2022-01-02 NOTE — ED Provider Notes (Addendum)
Reviewing patient's work-up, and UA was just finally collected this morning.  Has >50 cc.  We will treat for simple cystitis with 5 days of cefdinir.    Rada Hay, MD 01/02/22 1224    Rada Hay, MD 01/02/22 [phone]

## 2022-01-03 DIAGNOSIS — I2510 Atherosclerotic heart disease of native coronary artery without angina pectoris: Secondary | ICD-10-CM | POA: Diagnosis not present

## 2022-01-03 DIAGNOSIS — E119 Type 2 diabetes mellitus without complications: Secondary | ICD-10-CM | POA: Diagnosis not present

## 2022-01-03 DIAGNOSIS — K5900 Constipation, unspecified: Secondary | ICD-10-CM | POA: Diagnosis not present

## 2022-01-03 DIAGNOSIS — Z800 Family history of malignant neoplasm of digestive organs: Secondary | ICD-10-CM | POA: Diagnosis not present

## 2022-01-03 DIAGNOSIS — C61 Malignant neoplasm of prostate: Secondary | ICD-10-CM | POA: Diagnosis not present

## 2022-01-03 DIAGNOSIS — E0590 Thyrotoxicosis, unspecified without thyrotoxic crisis or storm: Secondary | ICD-10-CM | POA: Diagnosis not present

## 2022-01-03 DIAGNOSIS — R948 Abnormal results of function studies of other organs and systems: Secondary | ICD-10-CM | POA: Diagnosis not present

## 2022-01-03 DIAGNOSIS — E785 Hyperlipidemia, unspecified: Secondary | ICD-10-CM | POA: Diagnosis not present

## 2022-01-03 DIAGNOSIS — W06XXXA Fall from bed, initial encounter: Secondary | ICD-10-CM | POA: Diagnosis not present

## 2022-01-03 DIAGNOSIS — M533 Sacrococcygeal disorders, not elsewhere classified: Secondary | ICD-10-CM | POA: Diagnosis not present

## 2022-01-03 DIAGNOSIS — M6281 Muscle weakness (generalized): Secondary | ICD-10-CM | POA: Diagnosis not present

## 2022-01-03 DIAGNOSIS — M899 Disorder of bone, unspecified: Secondary | ICD-10-CM | POA: Diagnosis not present

## 2022-01-03 DIAGNOSIS — M4126 Other idiopathic scoliosis, lumbar region: Secondary | ICD-10-CM | POA: Diagnosis not present

## 2022-01-03 DIAGNOSIS — R2241 Localized swelling, mass and lump, right lower limb: Secondary | ICD-10-CM | POA: Diagnosis not present

## 2022-01-03 DIAGNOSIS — R5383 Other fatigue: Secondary | ICD-10-CM | POA: Diagnosis not present

## 2022-01-03 DIAGNOSIS — G8929 Other chronic pain: Secondary | ICD-10-CM | POA: Diagnosis not present

## 2022-01-03 DIAGNOSIS — M25569 Pain in unspecified knee: Secondary | ICD-10-CM | POA: Diagnosis not present

## 2022-01-03 DIAGNOSIS — C649 Malignant neoplasm of unspecified kidney, except renal pelvis: Secondary | ICD-10-CM | POA: Diagnosis not present

## 2022-01-03 DIAGNOSIS — H902 Conductive hearing loss, unspecified: Secondary | ICD-10-CM | POA: Diagnosis not present

## 2022-01-03 DIAGNOSIS — R29898 Other symptoms and signs involving the musculoskeletal system: Secondary | ICD-10-CM | POA: Diagnosis not present

## 2022-01-03 DIAGNOSIS — H6123 Impacted cerumen, bilateral: Secondary | ICD-10-CM | POA: Diagnosis not present

## 2022-01-03 DIAGNOSIS — E559 Vitamin D deficiency, unspecified: Secondary | ICD-10-CM | POA: Diagnosis not present

## 2022-01-03 DIAGNOSIS — R52 Pain, unspecified: Secondary | ICD-10-CM | POA: Diagnosis not present

## 2022-01-03 DIAGNOSIS — M545 Low back pain, unspecified: Secondary | ICD-10-CM | POA: Diagnosis not present

## 2022-01-03 DIAGNOSIS — M1380 Other specified arthritis, unspecified site: Secondary | ICD-10-CM | POA: Diagnosis not present

## 2022-01-03 DIAGNOSIS — Z87440 Personal history of urinary (tract) infections: Secondary | ICD-10-CM | POA: Diagnosis not present

## 2022-01-03 DIAGNOSIS — M4986 Spondylopathy in diseases classified elsewhere, lumbar region: Secondary | ICD-10-CM | POA: Diagnosis not present

## 2022-01-03 DIAGNOSIS — Z85528 Personal history of other malignant neoplasm of kidney: Secondary | ICD-10-CM | POA: Diagnosis not present

## 2022-01-03 DIAGNOSIS — F32A Depression, unspecified: Secondary | ICD-10-CM | POA: Diagnosis not present

## 2022-01-03 DIAGNOSIS — I119 Hypertensive heart disease without heart failure: Secondary | ICD-10-CM | POA: Diagnosis not present

## 2022-01-03 DIAGNOSIS — R310 Gross hematuria: Secondary | ICD-10-CM | POA: Diagnosis not present

## 2022-01-03 DIAGNOSIS — M25561 Pain in right knee: Secondary | ICD-10-CM | POA: Diagnosis not present

## 2022-01-03 DIAGNOSIS — M47816 Spondylosis without myelopathy or radiculopathy, lumbar region: Secondary | ICD-10-CM | POA: Diagnosis not present

## 2022-01-03 DIAGNOSIS — M5126 Other intervertebral disc displacement, lumbar region: Secondary | ICD-10-CM | POA: Diagnosis not present

## 2022-01-03 DIAGNOSIS — M1990 Unspecified osteoarthritis, unspecified site: Secondary | ICD-10-CM | POA: Diagnosis not present

## 2022-01-03 DIAGNOSIS — R911 Solitary pulmonary nodule: Secondary | ICD-10-CM | POA: Diagnosis not present

## 2022-01-03 DIAGNOSIS — M1711 Unilateral primary osteoarthritis, right knee: Secondary | ICD-10-CM | POA: Diagnosis not present

## 2022-01-03 DIAGNOSIS — Z20822 Contact with and (suspected) exposure to covid-19: Secondary | ICD-10-CM | POA: Diagnosis not present

## 2022-01-03 DIAGNOSIS — M1712 Unilateral primary osteoarthritis, left knee: Secondary | ICD-10-CM | POA: Diagnosis not present

## 2022-01-03 DIAGNOSIS — Z885 Allergy status to narcotic agent status: Secondary | ICD-10-CM | POA: Diagnosis not present

## 2022-01-03 DIAGNOSIS — R5381 Other malaise: Secondary | ICD-10-CM | POA: Diagnosis not present

## 2022-01-03 DIAGNOSIS — M6259 Muscle wasting and atrophy, not elsewhere classified, multiple sites: Secondary | ICD-10-CM | POA: Diagnosis not present

## 2022-01-03 DIAGNOSIS — Z743 Need for continuous supervision: Secondary | ICD-10-CM | POA: Diagnosis not present

## 2022-01-03 DIAGNOSIS — Z736 Limitation of activities due to disability: Secondary | ICD-10-CM | POA: Diagnosis not present

## 2022-01-03 DIAGNOSIS — M5416 Radiculopathy, lumbar region: Secondary | ICD-10-CM | POA: Diagnosis not present

## 2022-01-03 DIAGNOSIS — R2242 Localized swelling, mass and lump, left lower limb: Secondary | ICD-10-CM | POA: Diagnosis not present

## 2022-01-03 DIAGNOSIS — I700 Atherosclerosis of aorta: Secondary | ICD-10-CM | POA: Diagnosis not present

## 2022-01-03 DIAGNOSIS — R319 Hematuria, unspecified: Secondary | ICD-10-CM | POA: Diagnosis not present

## 2022-01-03 DIAGNOSIS — R4182 Altered mental status, unspecified: Secondary | ICD-10-CM | POA: Diagnosis not present

## 2022-01-03 DIAGNOSIS — Z87442 Personal history of urinary calculi: Secondary | ICD-10-CM | POA: Diagnosis not present

## 2022-01-03 DIAGNOSIS — R2681 Unsteadiness on feet: Secondary | ICD-10-CM | POA: Diagnosis not present

## 2022-01-03 DIAGNOSIS — R296 Repeated falls: Secondary | ICD-10-CM | POA: Diagnosis not present

## 2022-01-03 DIAGNOSIS — M1000 Idiopathic gout, unspecified site: Secondary | ICD-10-CM | POA: Diagnosis not present

## 2022-01-03 DIAGNOSIS — Z79899 Other long term (current) drug therapy: Secondary | ICD-10-CM | POA: Diagnosis not present

## 2022-01-03 DIAGNOSIS — R059 Cough, unspecified: Secondary | ICD-10-CM | POA: Diagnosis not present

## 2022-01-03 DIAGNOSIS — N390 Urinary tract infection, site not specified: Secondary | ICD-10-CM | POA: Diagnosis not present

## 2022-01-03 DIAGNOSIS — I10 Essential (primary) hypertension: Secondary | ICD-10-CM | POA: Diagnosis not present

## 2022-01-03 DIAGNOSIS — N200 Calculus of kidney: Secondary | ICD-10-CM | POA: Diagnosis not present

## 2022-01-03 DIAGNOSIS — Z833 Family history of diabetes mellitus: Secondary | ICD-10-CM | POA: Diagnosis not present

## 2022-01-03 DIAGNOSIS — M4316 Spondylolisthesis, lumbar region: Secondary | ICD-10-CM | POA: Diagnosis not present

## 2022-01-03 DIAGNOSIS — R41841 Cognitive communication deficit: Secondary | ICD-10-CM | POA: Diagnosis not present

## 2022-01-03 DIAGNOSIS — M25562 Pain in left knee: Secondary | ICD-10-CM | POA: Diagnosis not present

## 2022-01-03 DIAGNOSIS — Z905 Acquired absence of kidney: Secondary | ICD-10-CM | POA: Diagnosis not present

## 2022-01-03 DIAGNOSIS — R634 Abnormal weight loss: Secondary | ICD-10-CM | POA: Diagnosis not present

## 2022-01-03 DIAGNOSIS — M48061 Spinal stenosis, lumbar region without neurogenic claudication: Secondary | ICD-10-CM | POA: Diagnosis not present

## 2022-01-03 DIAGNOSIS — N281 Cyst of kidney, acquired: Secondary | ICD-10-CM | POA: Diagnosis not present

## 2022-01-03 DIAGNOSIS — R531 Weakness: Secondary | ICD-10-CM | POA: Diagnosis not present

## 2022-01-03 DIAGNOSIS — E669 Obesity, unspecified: Secondary | ICD-10-CM | POA: Diagnosis not present

## 2022-01-03 DIAGNOSIS — Z8546 Personal history of malignant neoplasm of prostate: Secondary | ICD-10-CM | POA: Diagnosis not present

## 2022-01-03 DIAGNOSIS — R262 Difficulty in walking, not elsewhere classified: Secondary | ICD-10-CM | POA: Diagnosis not present

## 2022-01-03 LAB — RESP PANEL BY RT-PCR (FLU A&B, COVID) ARPGX2
Influenza A by PCR: NEGATIVE
Influenza B by PCR: NEGATIVE
SARS Coronavirus 2 by RT PCR: NEGATIVE

## 2022-01-03 NOTE — ED Notes (Signed)
Called [facility] for transport to [place]

## 2022-01-03 NOTE — TOC Transition Note (Signed)
Transition of Care [facility]) - CM/SW Discharge Note   Patient Details  Name: Sean [name] MRN: [id number] Date of Birth: [birth year]  [facility] CM/SW Contact:  Shelbie Hutching, RN Phone Number: 01/03/2022, 12:54 PM   Clinical Narrative:    RNCM reviewed bed offers with patient and with patient's brother Sean [name].  Patient and brother are a little frustrated that [facility] is not accepting the [facility].  [facility], [facility], and [facility] all offered beds.  Patient decided on [facility].  [facility] can accept patient today.  Patient will go to room 714, bedside RN will call report to [phone].  ED secretary with set up EMS transport.     Final next level of care: Skilled Nursing Facility Barriers to Discharge: Barriers Resolved   Patient Goals and CMS Choice Patient states their goals for this hospitalization and ongoing recovery are:: Patient agrees to go back to [facility] CMS Medicare.gov Compare Post Acute Care list provided to:: Patient Choice offered to / list presented to : Patient  Discharge Placement   Existing PASRR number confirmed : 12/31/21          Patient chooses bed at: [facility] Patient to be transferred to facility by: [facility] Name of family member notified: Sean [name]- brother Patient and family notified of of transfer: 01/03/22  Discharge Plan and Services   Discharge Planning Services: CM Consult Post Acute Care Choice: [facility]          DME Arranged: N/A DME Agency: NA       HH Arranged: NA HH Agency: NA        Social Determinants of Health (SDOH) Interventions     Readmission Risk Interventions No flowsheet data found.

## 2022-01-03 NOTE — ED Notes (Signed)
Patient's bed solid. Patient cleaned. New liens, gown, and male [place] applied. Peri care performed. New blankets given at this time.

## 2022-01-03 NOTE — ED Notes (Signed)
Patient appears to be sleeping at this time. Respirations even and unlabored. NAD noted. ?

## 2022-01-04 DIAGNOSIS — M545 Low back pain, unspecified: Secondary | ICD-10-CM | POA: Diagnosis not present

## 2022-01-04 DIAGNOSIS — M1000 Idiopathic gout, unspecified site: Secondary | ICD-10-CM | POA: Diagnosis not present

## 2022-01-04 DIAGNOSIS — I2510 Atherosclerotic heart disease of native coronary artery without angina pectoris: Secondary | ICD-10-CM | POA: Diagnosis not present

## 2022-01-04 DIAGNOSIS — M6281 Muscle weakness (generalized): Secondary | ICD-10-CM | POA: Diagnosis not present

## 2022-01-04 LAB — URINE CULTURE: Culture: >=100000 — AB

## 2022-01-05 DIAGNOSIS — M545 Low back pain, unspecified: Secondary | ICD-10-CM | POA: Diagnosis not present

## 2022-01-05 DIAGNOSIS — K5900 Constipation, unspecified: Secondary | ICD-10-CM | POA: Diagnosis not present

## 2022-01-05 DIAGNOSIS — I10 Essential (primary) hypertension: Secondary | ICD-10-CM | POA: Diagnosis not present

## 2022-01-05 NOTE — Consult Note (Signed)
Pt was treated with 5 days of cefdinir for UTI per MD note.   Eleonore Chiquito, PharmD,

## 2022-01-06 DIAGNOSIS — R319 Hematuria, unspecified: Secondary | ICD-10-CM | POA: Diagnosis not present

## 2022-01-06 DIAGNOSIS — M545 Low back pain, unspecified: Secondary | ICD-10-CM | POA: Diagnosis not present

## 2022-01-06 DIAGNOSIS — I10 Essential (primary) hypertension: Secondary | ICD-10-CM | POA: Diagnosis not present

## 2022-01-07 DIAGNOSIS — M6281 Muscle weakness (generalized): Secondary | ICD-10-CM | POA: Diagnosis not present

## 2022-01-07 DIAGNOSIS — R319 Hematuria, unspecified: Secondary | ICD-10-CM | POA: Diagnosis not present

## 2022-01-07 DIAGNOSIS — M545 Low back pain, unspecified: Secondary | ICD-10-CM | POA: Diagnosis not present

## 2022-01-10 DIAGNOSIS — R319 Hematuria, unspecified: Secondary | ICD-10-CM | POA: Diagnosis not present

## 2022-01-10 DIAGNOSIS — I10 Essential (primary) hypertension: Secondary | ICD-10-CM | POA: Diagnosis not present

## 2022-01-10 DIAGNOSIS — M545 Low back pain, unspecified: Secondary | ICD-10-CM | POA: Diagnosis not present

## 2022-01-10 DIAGNOSIS — M6281 Muscle weakness (generalized): Secondary | ICD-10-CM | POA: Diagnosis not present

## 2022-01-13 DIAGNOSIS — F32A Depression, unspecified: Secondary | ICD-10-CM | POA: Diagnosis not present

## 2022-01-13 DIAGNOSIS — M6281 Muscle weakness (generalized): Secondary | ICD-10-CM | POA: Diagnosis not present

## 2022-01-13 DIAGNOSIS — I10 Essential (primary) hypertension: Secondary | ICD-10-CM | POA: Diagnosis not present

## 2022-01-17 DIAGNOSIS — M6281 Muscle weakness (generalized): Secondary | ICD-10-CM | POA: Diagnosis not present

## 2022-01-17 DIAGNOSIS — I10 Essential (primary) hypertension: Secondary | ICD-10-CM | POA: Diagnosis not present

## 2022-01-20 DIAGNOSIS — R319 Hematuria, unspecified: Secondary | ICD-10-CM | POA: Diagnosis not present

## 2022-01-20 DIAGNOSIS — M6281 Muscle weakness (generalized): Secondary | ICD-10-CM | POA: Diagnosis not present

## 2022-01-21 DIAGNOSIS — M6281 Muscle weakness (generalized): Secondary | ICD-10-CM | POA: Diagnosis not present

## 2022-01-21 DIAGNOSIS — R319 Hematuria, unspecified: Secondary | ICD-10-CM | POA: Diagnosis not present

## 2022-01-24 DIAGNOSIS — R319 Hematuria, unspecified: Secondary | ICD-10-CM | POA: Diagnosis not present

## 2022-01-24 DIAGNOSIS — I2510 Atherosclerotic heart disease of native coronary artery without angina pectoris: Secondary | ICD-10-CM | POA: Diagnosis not present

## 2022-01-24 DIAGNOSIS — M6281 Muscle weakness (generalized): Secondary | ICD-10-CM | POA: Diagnosis not present

## 2022-01-27 DIAGNOSIS — M6281 Muscle weakness (generalized): Secondary | ICD-10-CM | POA: Diagnosis not present

## 2022-01-27 DIAGNOSIS — W06XXXA Fall from bed, initial encounter: Secondary | ICD-10-CM | POA: Diagnosis not present

## 2022-02-02 DIAGNOSIS — M6281 Muscle weakness (generalized): Secondary | ICD-10-CM | POA: Diagnosis not present

## 2022-02-02 DIAGNOSIS — I10 Essential (primary) hypertension: Secondary | ICD-10-CM | POA: Diagnosis not present

## 2022-02-04 DIAGNOSIS — M6281 Muscle weakness (generalized): Secondary | ICD-10-CM | POA: Diagnosis not present

## 2022-02-04 DIAGNOSIS — I2510 Atherosclerotic heart disease of native coronary artery without angina pectoris: Secondary | ICD-10-CM | POA: Diagnosis not present

## 2022-02-04 DIAGNOSIS — R319 Hematuria, unspecified: Secondary | ICD-10-CM | POA: Diagnosis not present

## 2022-02-04 DIAGNOSIS — I10 Essential (primary) hypertension: Secondary | ICD-10-CM | POA: Diagnosis not present

## 2022-02-08 DIAGNOSIS — M6281 Muscle weakness (generalized): Secondary | ICD-10-CM | POA: Diagnosis not present

## 2022-02-08 DIAGNOSIS — I2510 Atherosclerotic heart disease of native coronary artery without angina pectoris: Secondary | ICD-10-CM | POA: Diagnosis not present

## 2022-02-09 DIAGNOSIS — M6281 Muscle weakness (generalized): Secondary | ICD-10-CM | POA: Diagnosis not present

## 2022-02-09 DIAGNOSIS — M25569 Pain in unspecified knee: Secondary | ICD-10-CM | POA: Diagnosis not present

## 2022-02-17 ENCOUNTER — Other Ambulatory Visit: Payer: Self-pay | Admitting: Student

## 2022-02-17 DIAGNOSIS — M5126 Other intervertebral disc displacement, lumbar region: Secondary | ICD-10-CM | POA: Diagnosis not present

## 2022-02-22 DIAGNOSIS — M6281 Muscle weakness (generalized): Secondary | ICD-10-CM | POA: Diagnosis not present

## 2022-02-22 DIAGNOSIS — R296 Repeated falls: Secondary | ICD-10-CM | POA: Diagnosis not present

## 2022-03-03 DIAGNOSIS — M6281 Muscle weakness (generalized): Secondary | ICD-10-CM | POA: Diagnosis not present

## 2022-03-03 DIAGNOSIS — R296 Repeated falls: Secondary | ICD-10-CM | POA: Diagnosis not present

## 2022-03-04 DIAGNOSIS — I2510 Atherosclerotic heart disease of native coronary artery without angina pectoris: Secondary | ICD-10-CM | POA: Diagnosis not present

## 2022-03-04 DIAGNOSIS — M25569 Pain in unspecified knee: Secondary | ICD-10-CM | POA: Diagnosis not present

## 2022-03-04 DIAGNOSIS — M6281 Muscle weakness (generalized): Secondary | ICD-10-CM | POA: Diagnosis not present

## 2022-03-04 DIAGNOSIS — M545 Low back pain, unspecified: Secondary | ICD-10-CM | POA: Diagnosis not present

## 2022-03-15 ENCOUNTER — Other Ambulatory Visit: Payer: Self-pay | Admitting: Student

## 2022-03-15 DIAGNOSIS — M5126 Other intervertebral disc displacement, lumbar region: Secondary | ICD-10-CM

## 2022-03-22 ENCOUNTER — Ambulatory Visit
Admission: RE | Admit: 2022-03-22 | Discharge: 2022-03-22 | Disposition: A | Discharge status: Home / Self Care | Payer: Medicare Other | Source: Ambulatory Visit | Attending: Student | Admitting: Student

## 2022-03-22 DIAGNOSIS — M5126 Other intervertebral disc displacement, lumbar region: Secondary | ICD-10-CM | POA: Diagnosis not present

## 2022-03-22 DIAGNOSIS — M4316 Spondylolisthesis, lumbar region: Secondary | ICD-10-CM | POA: Diagnosis not present

## 2022-03-22 DIAGNOSIS — M4126 Other idiopathic scoliosis, lumbar region: Secondary | ICD-10-CM | POA: Diagnosis not present

## 2022-03-22 DIAGNOSIS — R29898 Other symptoms and signs involving the musculoskeletal system: Secondary | ICD-10-CM | POA: Diagnosis not present

## 2022-03-22 IMAGING — MR MR LUMBAR SPINE WO/W CM
5 of 7 series · 30 of 48 positions shown · IV contrast (gadavist)
Comparison: Lumbar MRI [DATE]. Intraoperative lumbar
radiographs [DATE], [DATE]. CT Abdomen and Pelvis
[DATE].

CLINICAL DATA: 75-year-old male with multiple falls since [REDACTED].
Bilateral leg weakness. Prior surgery. History of prostate cancer.

EXAM:
MRI LUMBAR SPINE WITHOUT AND WITH CONTRAST
TECHNIQUE: Multiplanar and multiecho pulse sequences of the lumbar spine were
obtained without and with intravenous contrast.
CONTRAST:  10mL GADAVIST GADOBUTROL 1 MMOL/ML IV SOLN

[Series 5: T2 · sagittal · 4.0mm · 0.81mm/px · 5 of 17 slices shown (1 of 2)]
[im 1/17]
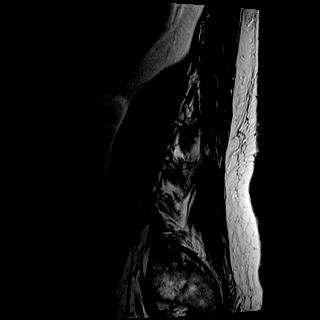
[im 5/17]
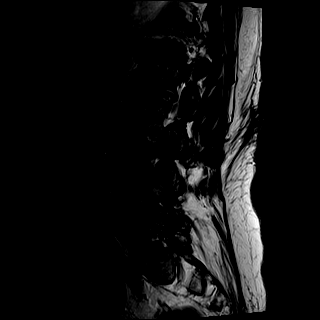
[im 9/17]
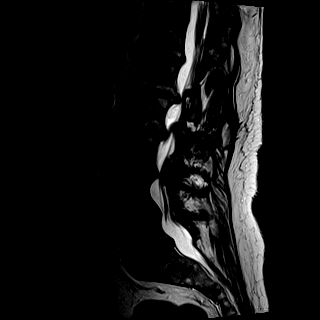
[im 13/17]
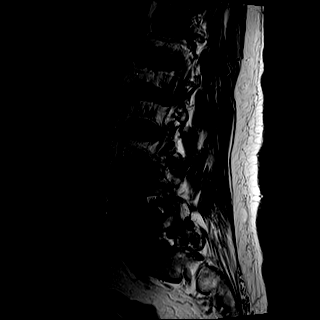
[im 17/17]
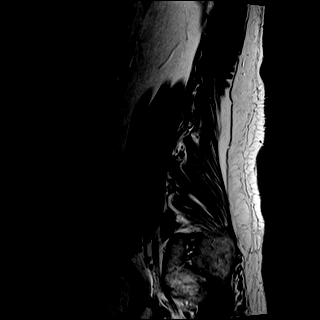

[Series 6: T1 · sagittal · 4.0mm · 0.81mm/px · 5 of 17 slices shown (1 of 2)]
[im 1/17]
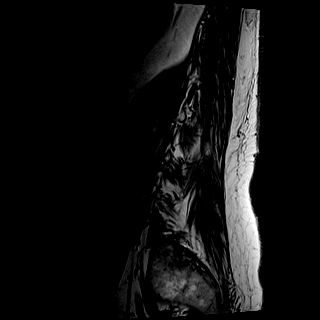
[im 5/17]
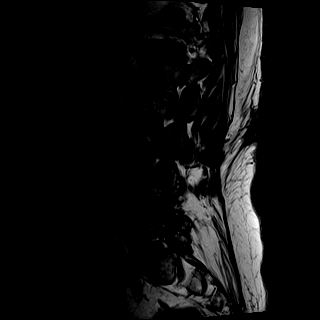
[im 9/17]
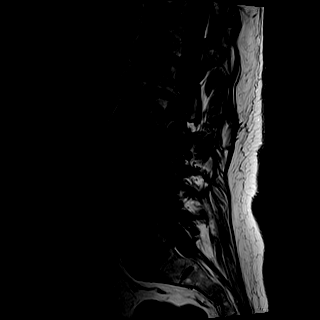
[im 13/17]
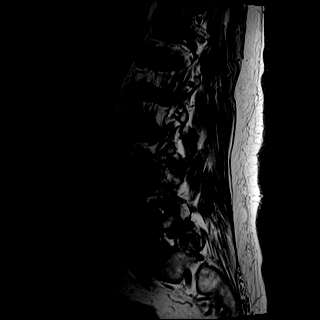
[im 17/17]
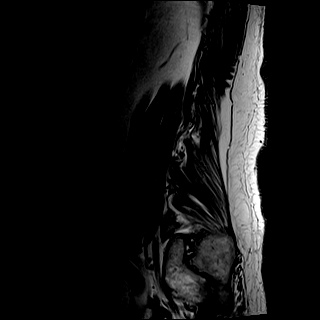

[Series 8: T2 · axial · 4.0mm · 0.78mm/px · z∈[-90,+139]mm · 8 of 38 slices shown (2 of 2)]
[im 1/38]
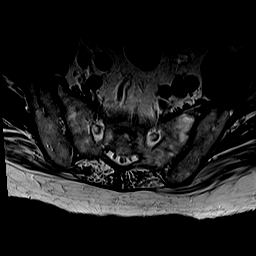
[im 5/38]
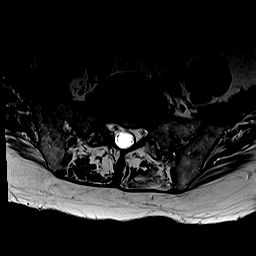
[im 13/38]
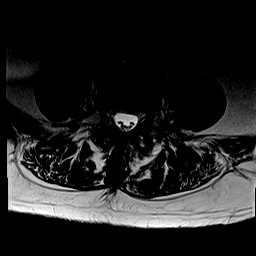
[im 17/38]
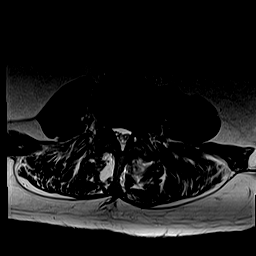
[im 21/38]
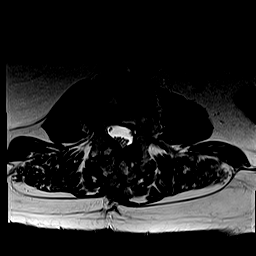
[im 25/38]
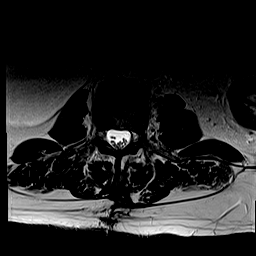
[im 33/38]
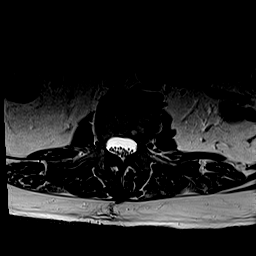
[im 38/38]
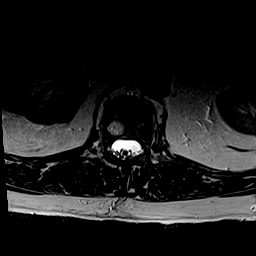

[Series 9: T1 · axial · 4.0mm · 0.39mm/px · z∈[-90,+139]mm · 8 of 38 slices shown (2 of 2)]
[im 1/38]
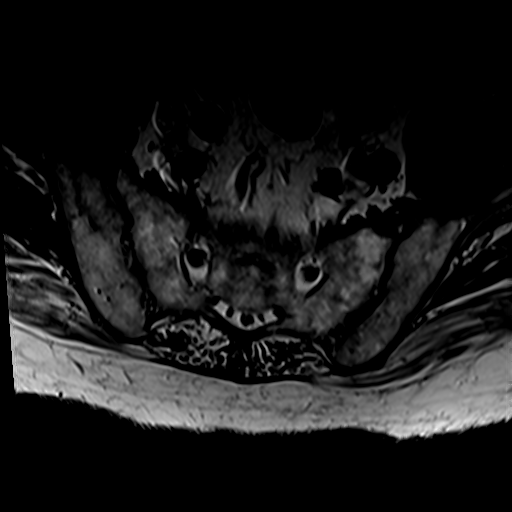
[im 5/38]
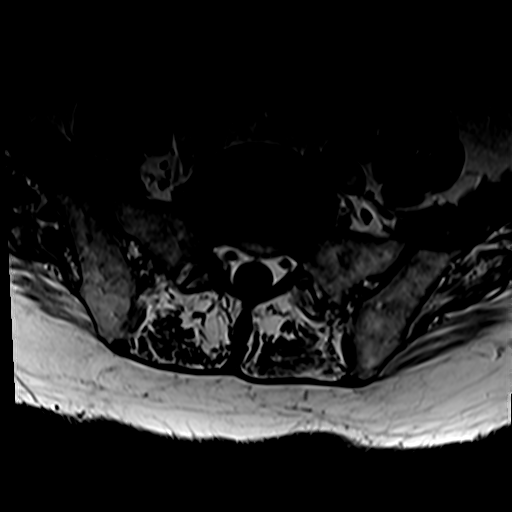
[im 13/38]
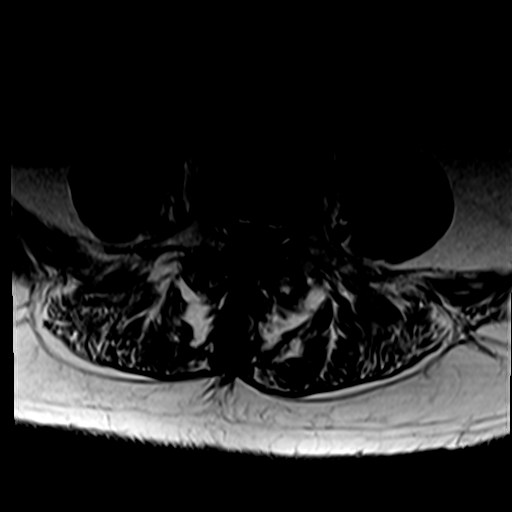
[im 17/38]
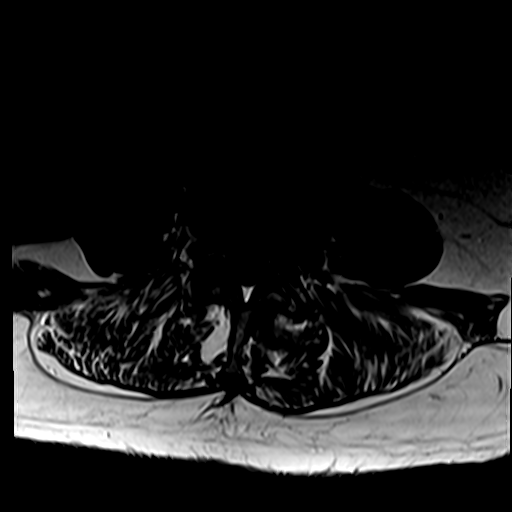
[im 21/38]
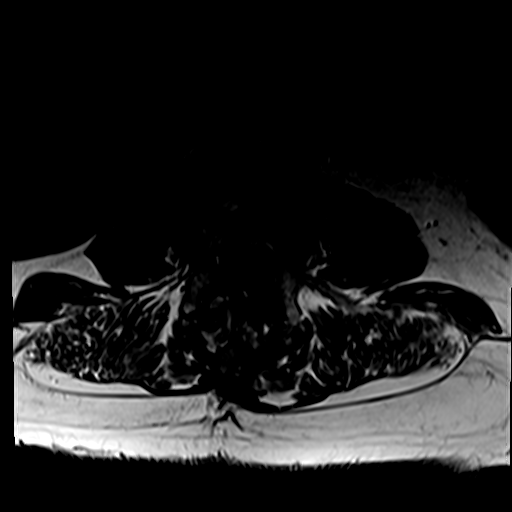
[im 25/38]
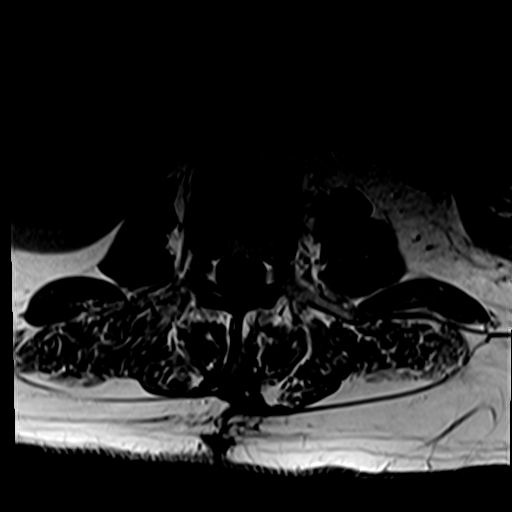
[im 33/38]
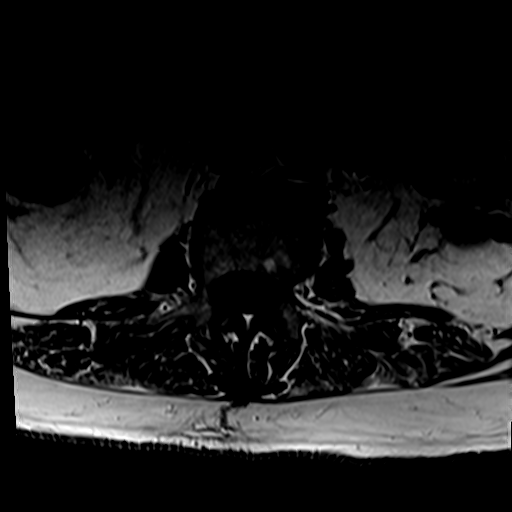
[im 38/38]
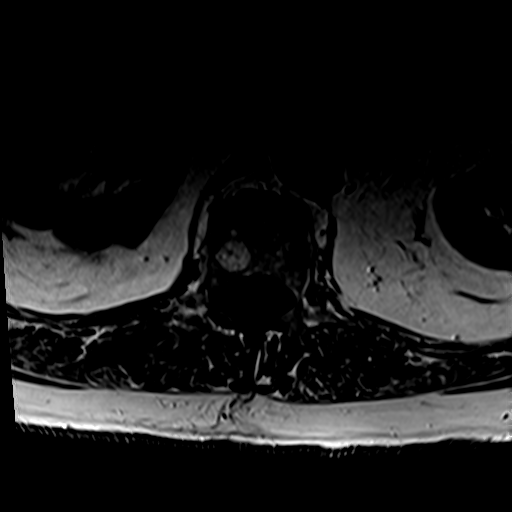

[Series 10: T1 fat-sat post-contrast · sagittal · 4.0mm · 0.81mm/px · 4 of 17 slices shown]
[im 1/17]
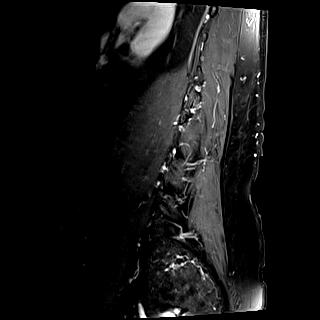
[im 6/17]
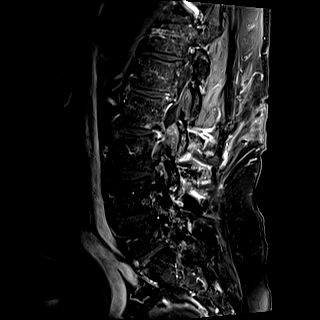
[im 11/17]
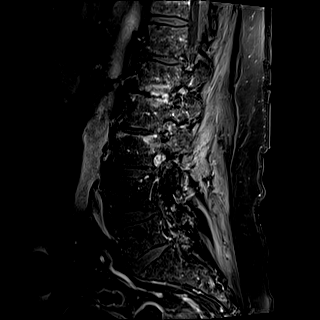
[im 17/17]
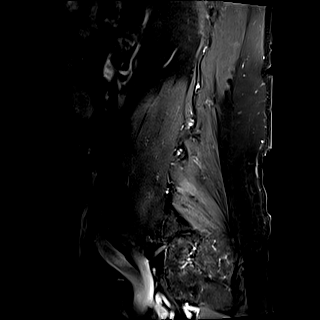

[30 of 48 positions shown; findings below may reference images not displayed]

FINDINGS: Segmentation: Lumbar segmentation seems to be normal on the
comparison radiographs and is the same numbering system used on the
[facility] MRI.

Alignment: Stable lumbar lordosis since last year. Mild grade 1
anterolisthesis of L4 on L5. Mild dextroconvex lumbar scoliosis
better demonstrated on the [REDACTED] radiographs.

Vertebrae: Chronic degenerative appearing endplate marrow edema
posteriorly at L1-L2 and L2-L3. Chronic disc degeneration there
detailed below. T12 and L5 benign vertebral body hemangiomas.

Visible sacrum appears intact, but there is increased conspicuity of
STIR hyperintensity and enhancement associated with the left S2-S3
junction near the exiting left S2 nerve (series 7, image 10 and
series 10, image 10). No suspicious bone lesion there on the [facility]
CT. There is a small nearby sacral S3 benign hemangioma.

No other marrow edema or evidence of acute osseous abnormality.

Conus medullaris and cauda equina: Conus extends to the T12 level.
No lower spinal cord or conus signal abnormality.

Paraspinal and other soft tissues: Stable to the CT Abdomen and
Pelvis last year. Chronic large and exophytic but simple appearing
right lower pole renal cyst. Postoperative changes to the posterior
paraspinal soft tissues with no adverse features.

Disc levels:

T11-T12: Negative.

T12-L1: Chronic circumferential disc bulge eccentric to the left. No
significant stenosis.

L1-L2: Pronounced chronic disc space loss and leftward
circumferential disc osteophyte complex. Mild facet hypertrophy. But
regressed epidural lipomatosis and largely resolved spinal stenosis
here since [REDACTED]. Residual mild to moderate left lateral recess
stenosis (left L2 nerve level) and left L1 neural foraminal
stenosis.

L2-L3: Mild chronic retrolisthesis with circumferential disc bulge,
endplate spurring, and mild to moderate posterior element
hypertrophy. Largely resected or resolved caudal disc extrusion in
the left lateral recess here. See series 5, image 9. And decreased
or resected posterior epidural lipomatosis.

Thecal sac patency here has also improved. No significant spinal
stenosis. No convincing lateral recess stenosis now. Moderate
bilateral L2 neural foraminal stenosis appears stable (left) to
mildly improved (right).

L3-L4: Chronic circumferential disc bulge with broad-based posterior
component and moderate facet and ligament flavum hypertrophy. Less
epidural lipomatosis posteriorly. Mildly improved moderate spinal
stenosis. Mild to moderate residual left lateral recess stenosis
(left L4 nerve level). Moderate left and mild right L3 foraminal
stenosis appears stable.

L4-L5: Chronic grade 1 anterolisthesis with circumferential
disc/pseudo disc and severe chronic facet hypertrophy. Trace
degenerative facet joint fluid. Mild to moderate ligament flavum
hypertrophy. Stable mild to moderate spinal stenosis with mostly
lateral thecal sac mass effect (series 8, image 29). Mild to
moderate bilateral lateral recess stenosis appears improved
(descending L5 nerve levels). Mild left and severe right L4
foraminal stenosis is stable.

L5-S1: Stable mild disc bulging and moderate facet hypertrophy. No
spinal or lateral recess stenosis. Mild to moderate bilateral L5
foraminal stenosis is stable.
IMPRESSION: 1. Small but conspicuous abnormal marrow signal with enhancement in
the central sacrum at S2-S3, near the exiting left S2 nerve. In the
setting of prostate cancer this is indeterminate for a small sacral
metastasis. Sacral fracture is felt less likely.
Designated Sacrum MRI (without and with contrast) or Pelvis CT
(noncontrast may suffice) might characterize further.

2. No metastatic disease identified in the lumbar spine. But
widespread advanced lumbar disc, endplate, and posterior element
degeneration.
Interval postoperative changes posteriorly at L2 with improved
thecal sac patency and left lateral recess patency at L2-L3,
improved thecal sac patency also at L1-L2 and L3-L4.

3. Residual mild to moderate spinal stenosis at L3-L4 and L4-L5
(chronic grade 1 anterolisthesis at the latter). And chronic
moderate foraminal stenosis at the bilateral L2, and left L3 nerve
levels, Severe at the right L4 nerve level.

## 2022-03-22 MED ORDER — GADOBUTROL 1 MMOL/ML IV SOLN
10.0000 mL | Freq: Once | INTRAVENOUS | Status: AC | PRN
  Administered 2022-03-22: 10 mL via INTRAVENOUS

## 2022-03-28 DIAGNOSIS — R310 Gross hematuria: Secondary | ICD-10-CM | POA: Diagnosis not present

## 2022-03-29 DIAGNOSIS — M48061 Spinal stenosis, lumbar region without neurogenic claudication: Secondary | ICD-10-CM | POA: Diagnosis not present

## 2022-03-29 DIAGNOSIS — M5126 Other intervertebral disc displacement, lumbar region: Secondary | ICD-10-CM | POA: Diagnosis not present

## 2022-03-29 DIAGNOSIS — F32A Depression, unspecified: Secondary | ICD-10-CM | POA: Diagnosis not present

## 2022-03-29 DIAGNOSIS — I2510 Atherosclerotic heart disease of native coronary artery without angina pectoris: Secondary | ICD-10-CM | POA: Diagnosis not present

## 2022-03-29 DIAGNOSIS — G8929 Other chronic pain: Secondary | ICD-10-CM | POA: Diagnosis not present

## 2022-03-29 DIAGNOSIS — E669 Obesity, unspecified: Secondary | ICD-10-CM | POA: Diagnosis not present

## 2022-03-29 DIAGNOSIS — M1380 Other specified arthritis, unspecified site: Secondary | ICD-10-CM | POA: Diagnosis not present

## 2022-03-29 DIAGNOSIS — M6259 Muscle wasting and atrophy, not elsewhere classified, multiple sites: Secondary | ICD-10-CM | POA: Diagnosis not present

## 2022-03-29 DIAGNOSIS — I10 Essential (primary) hypertension: Secondary | ICD-10-CM | POA: Diagnosis not present

## 2022-03-29 DIAGNOSIS — M1000 Idiopathic gout, unspecified site: Secondary | ICD-10-CM | POA: Diagnosis not present

## 2022-03-29 DIAGNOSIS — E785 Hyperlipidemia, unspecified: Secondary | ICD-10-CM | POA: Diagnosis not present

## 2022-03-29 DIAGNOSIS — M6281 Muscle weakness (generalized): Secondary | ICD-10-CM | POA: Diagnosis not present

## 2022-03-29 DIAGNOSIS — M4986 Spondylopathy in diseases classified elsewhere, lumbar region: Secondary | ICD-10-CM | POA: Diagnosis not present

## 2022-03-30 DIAGNOSIS — M6259 Muscle wasting and atrophy, not elsewhere classified, multiple sites: Secondary | ICD-10-CM | POA: Diagnosis not present

## 2022-03-30 DIAGNOSIS — I2510 Atherosclerotic heart disease of native coronary artery without angina pectoris: Secondary | ICD-10-CM | POA: Diagnosis not present

## 2022-03-30 DIAGNOSIS — M48061 Spinal stenosis, lumbar region without neurogenic claudication: Secondary | ICD-10-CM | POA: Diagnosis not present

## 2022-03-30 DIAGNOSIS — M5126 Other intervertebral disc displacement, lumbar region: Secondary | ICD-10-CM | POA: Diagnosis not present

## 2022-03-30 DIAGNOSIS — I10 Essential (primary) hypertension: Secondary | ICD-10-CM | POA: Diagnosis not present

## 2022-03-30 DIAGNOSIS — M4986 Spondylopathy in diseases classified elsewhere, lumbar region: Secondary | ICD-10-CM | POA: Diagnosis not present

## 2022-03-31 DIAGNOSIS — I2510 Atherosclerotic heart disease of native coronary artery without angina pectoris: Secondary | ICD-10-CM | POA: Diagnosis not present

## 2022-03-31 DIAGNOSIS — M4986 Spondylopathy in diseases classified elsewhere, lumbar region: Secondary | ICD-10-CM | POA: Diagnosis not present

## 2022-03-31 DIAGNOSIS — M48061 Spinal stenosis, lumbar region without neurogenic claudication: Secondary | ICD-10-CM | POA: Diagnosis not present

## 2022-03-31 DIAGNOSIS — M6259 Muscle wasting and atrophy, not elsewhere classified, multiple sites: Secondary | ICD-10-CM | POA: Diagnosis not present

## 2022-03-31 DIAGNOSIS — M5126 Other intervertebral disc displacement, lumbar region: Secondary | ICD-10-CM | POA: Diagnosis not present

## 2022-03-31 DIAGNOSIS — I10 Essential (primary) hypertension: Secondary | ICD-10-CM | POA: Diagnosis not present

## 2022-04-01 DIAGNOSIS — M5126 Other intervertebral disc displacement, lumbar region: Secondary | ICD-10-CM | POA: Diagnosis not present

## 2022-04-01 DIAGNOSIS — M4986 Spondylopathy in diseases classified elsewhere, lumbar region: Secondary | ICD-10-CM | POA: Diagnosis not present

## 2022-04-01 DIAGNOSIS — I10 Essential (primary) hypertension: Secondary | ICD-10-CM | POA: Diagnosis not present

## 2022-04-01 DIAGNOSIS — I2510 Atherosclerotic heart disease of native coronary artery without angina pectoris: Secondary | ICD-10-CM | POA: Diagnosis not present

## 2022-04-01 DIAGNOSIS — M48061 Spinal stenosis, lumbar region without neurogenic claudication: Secondary | ICD-10-CM | POA: Diagnosis not present

## 2022-04-01 DIAGNOSIS — M6259 Muscle wasting and atrophy, not elsewhere classified, multiple sites: Secondary | ICD-10-CM | POA: Diagnosis not present

## 2022-04-04 DIAGNOSIS — M5126 Other intervertebral disc displacement, lumbar region: Secondary | ICD-10-CM | POA: Diagnosis not present

## 2022-04-04 DIAGNOSIS — M6259 Muscle wasting and atrophy, not elsewhere classified, multiple sites: Secondary | ICD-10-CM | POA: Diagnosis not present

## 2022-04-04 DIAGNOSIS — M4986 Spondylopathy in diseases classified elsewhere, lumbar region: Secondary | ICD-10-CM | POA: Diagnosis not present

## 2022-04-04 DIAGNOSIS — M48061 Spinal stenosis, lumbar region without neurogenic claudication: Secondary | ICD-10-CM | POA: Diagnosis not present

## 2022-04-04 DIAGNOSIS — I2510 Atherosclerotic heart disease of native coronary artery without angina pectoris: Secondary | ICD-10-CM | POA: Diagnosis not present

## 2022-04-04 DIAGNOSIS — I10 Essential (primary) hypertension: Secondary | ICD-10-CM | POA: Diagnosis not present

## 2022-04-05 DIAGNOSIS — M4986 Spondylopathy in diseases classified elsewhere, lumbar region: Secondary | ICD-10-CM | POA: Diagnosis not present

## 2022-04-05 DIAGNOSIS — M48061 Spinal stenosis, lumbar region without neurogenic claudication: Secondary | ICD-10-CM | POA: Diagnosis not present

## 2022-04-05 DIAGNOSIS — M6259 Muscle wasting and atrophy, not elsewhere classified, multiple sites: Secondary | ICD-10-CM | POA: Diagnosis not present

## 2022-04-05 DIAGNOSIS — I2510 Atherosclerotic heart disease of native coronary artery without angina pectoris: Secondary | ICD-10-CM | POA: Diagnosis not present

## 2022-04-05 DIAGNOSIS — R310 Gross hematuria: Secondary | ICD-10-CM | POA: Diagnosis not present

## 2022-04-05 DIAGNOSIS — M5126 Other intervertebral disc displacement, lumbar region: Secondary | ICD-10-CM | POA: Diagnosis not present

## 2022-04-05 DIAGNOSIS — I10 Essential (primary) hypertension: Secondary | ICD-10-CM | POA: Diagnosis not present

## 2022-04-06 DIAGNOSIS — M5126 Other intervertebral disc displacement, lumbar region: Secondary | ICD-10-CM | POA: Diagnosis not present

## 2022-04-06 DIAGNOSIS — N390 Urinary tract infection, site not specified: Secondary | ICD-10-CM | POA: Diagnosis not present

## 2022-04-06 DIAGNOSIS — I10 Essential (primary) hypertension: Secondary | ICD-10-CM | POA: Diagnosis not present

## 2022-04-06 DIAGNOSIS — M4986 Spondylopathy in diseases classified elsewhere, lumbar region: Secondary | ICD-10-CM | POA: Diagnosis not present

## 2022-04-06 DIAGNOSIS — M48061 Spinal stenosis, lumbar region without neurogenic claudication: Secondary | ICD-10-CM | POA: Diagnosis not present

## 2022-04-06 DIAGNOSIS — M6259 Muscle wasting and atrophy, not elsewhere classified, multiple sites: Secondary | ICD-10-CM | POA: Diagnosis not present

## 2022-04-06 DIAGNOSIS — I2510 Atherosclerotic heart disease of native coronary artery without angina pectoris: Secondary | ICD-10-CM | POA: Diagnosis not present

## 2022-04-06 DIAGNOSIS — E559 Vitamin D deficiency, unspecified: Secondary | ICD-10-CM | POA: Diagnosis not present

## 2022-04-07 DIAGNOSIS — R310 Gross hematuria: Secondary | ICD-10-CM | POA: Diagnosis not present

## 2022-04-07 DIAGNOSIS — I2510 Atherosclerotic heart disease of native coronary artery without angina pectoris: Secondary | ICD-10-CM | POA: Diagnosis not present

## 2022-04-07 DIAGNOSIS — M48061 Spinal stenosis, lumbar region without neurogenic claudication: Secondary | ICD-10-CM | POA: Diagnosis not present

## 2022-04-07 DIAGNOSIS — M6281 Muscle weakness (generalized): Secondary | ICD-10-CM | POA: Diagnosis not present

## 2022-04-07 DIAGNOSIS — I10 Essential (primary) hypertension: Secondary | ICD-10-CM | POA: Diagnosis not present

## 2022-04-07 DIAGNOSIS — M6259 Muscle wasting and atrophy, not elsewhere classified, multiple sites: Secondary | ICD-10-CM | POA: Diagnosis not present

## 2022-04-07 DIAGNOSIS — M4986 Spondylopathy in diseases classified elsewhere, lumbar region: Secondary | ICD-10-CM | POA: Diagnosis not present

## 2022-04-07 DIAGNOSIS — R4182 Altered mental status, unspecified: Secondary | ICD-10-CM | POA: Diagnosis not present

## 2022-04-07 DIAGNOSIS — M5126 Other intervertebral disc displacement, lumbar region: Secondary | ICD-10-CM | POA: Diagnosis not present

## 2022-04-07 DIAGNOSIS — E559 Vitamin D deficiency, unspecified: Secondary | ICD-10-CM | POA: Diagnosis not present

## 2022-04-08 DIAGNOSIS — I10 Essential (primary) hypertension: Secondary | ICD-10-CM | POA: Diagnosis not present

## 2022-04-08 DIAGNOSIS — I2510 Atherosclerotic heart disease of native coronary artery without angina pectoris: Secondary | ICD-10-CM | POA: Diagnosis not present

## 2022-04-08 DIAGNOSIS — M5126 Other intervertebral disc displacement, lumbar region: Secondary | ICD-10-CM | POA: Diagnosis not present

## 2022-04-08 DIAGNOSIS — N390 Urinary tract infection, site not specified: Secondary | ICD-10-CM | POA: Diagnosis not present

## 2022-04-08 DIAGNOSIS — M6259 Muscle wasting and atrophy, not elsewhere classified, multiple sites: Secondary | ICD-10-CM | POA: Diagnosis not present

## 2022-04-08 DIAGNOSIS — M4986 Spondylopathy in diseases classified elsewhere, lumbar region: Secondary | ICD-10-CM | POA: Diagnosis not present

## 2022-04-08 DIAGNOSIS — M48061 Spinal stenosis, lumbar region without neurogenic claudication: Secondary | ICD-10-CM | POA: Diagnosis not present

## 2022-04-11 DIAGNOSIS — M4986 Spondylopathy in diseases classified elsewhere, lumbar region: Secondary | ICD-10-CM | POA: Diagnosis not present

## 2022-04-11 DIAGNOSIS — M6259 Muscle wasting and atrophy, not elsewhere classified, multiple sites: Secondary | ICD-10-CM | POA: Diagnosis not present

## 2022-04-11 DIAGNOSIS — I10 Essential (primary) hypertension: Secondary | ICD-10-CM | POA: Diagnosis not present

## 2022-04-11 DIAGNOSIS — M5126 Other intervertebral disc displacement, lumbar region: Secondary | ICD-10-CM | POA: Diagnosis not present

## 2022-04-11 DIAGNOSIS — M48061 Spinal stenosis, lumbar region without neurogenic claudication: Secondary | ICD-10-CM | POA: Diagnosis not present

## 2022-04-11 DIAGNOSIS — I2510 Atherosclerotic heart disease of native coronary artery without angina pectoris: Secondary | ICD-10-CM | POA: Diagnosis not present

## 2022-04-12 DIAGNOSIS — R310 Gross hematuria: Secondary | ICD-10-CM | POA: Diagnosis not present

## 2022-04-12 DIAGNOSIS — I10 Essential (primary) hypertension: Secondary | ICD-10-CM | POA: Diagnosis not present

## 2022-04-12 DIAGNOSIS — M6259 Muscle wasting and atrophy, not elsewhere classified, multiple sites: Secondary | ICD-10-CM | POA: Diagnosis not present

## 2022-04-12 DIAGNOSIS — M4986 Spondylopathy in diseases classified elsewhere, lumbar region: Secondary | ICD-10-CM | POA: Diagnosis not present

## 2022-04-12 DIAGNOSIS — E559 Vitamin D deficiency, unspecified: Secondary | ICD-10-CM | POA: Diagnosis not present

## 2022-04-12 DIAGNOSIS — M48061 Spinal stenosis, lumbar region without neurogenic claudication: Secondary | ICD-10-CM | POA: Diagnosis not present

## 2022-04-12 DIAGNOSIS — I2510 Atherosclerotic heart disease of native coronary artery without angina pectoris: Secondary | ICD-10-CM | POA: Diagnosis not present

## 2022-04-12 DIAGNOSIS — M545 Low back pain, unspecified: Secondary | ICD-10-CM | POA: Diagnosis not present

## 2022-04-12 DIAGNOSIS — M5126 Other intervertebral disc displacement, lumbar region: Secondary | ICD-10-CM | POA: Diagnosis not present

## 2022-04-12 DIAGNOSIS — M6281 Muscle weakness (generalized): Secondary | ICD-10-CM | POA: Diagnosis not present

## 2022-04-13 DIAGNOSIS — I10 Essential (primary) hypertension: Secondary | ICD-10-CM | POA: Diagnosis not present

## 2022-04-13 DIAGNOSIS — M6259 Muscle wasting and atrophy, not elsewhere classified, multiple sites: Secondary | ICD-10-CM | POA: Diagnosis not present

## 2022-04-13 DIAGNOSIS — I2510 Atherosclerotic heart disease of native coronary artery without angina pectoris: Secondary | ICD-10-CM | POA: Diagnosis not present

## 2022-04-13 DIAGNOSIS — M5126 Other intervertebral disc displacement, lumbar region: Secondary | ICD-10-CM | POA: Diagnosis not present

## 2022-04-13 DIAGNOSIS — M4986 Spondylopathy in diseases classified elsewhere, lumbar region: Secondary | ICD-10-CM | POA: Diagnosis not present

## 2022-04-13 DIAGNOSIS — M48061 Spinal stenosis, lumbar region without neurogenic claudication: Secondary | ICD-10-CM | POA: Diagnosis not present

## 2022-04-14 DIAGNOSIS — I10 Essential (primary) hypertension: Secondary | ICD-10-CM | POA: Diagnosis not present

## 2022-04-14 DIAGNOSIS — E119 Type 2 diabetes mellitus without complications: Secondary | ICD-10-CM | POA: Diagnosis not present

## 2022-04-14 DIAGNOSIS — M6259 Muscle wasting and atrophy, not elsewhere classified, multiple sites: Secondary | ICD-10-CM | POA: Diagnosis not present

## 2022-04-14 DIAGNOSIS — E0590 Thyrotoxicosis, unspecified without thyrotoxic crisis or storm: Secondary | ICD-10-CM | POA: Diagnosis not present

## 2022-04-14 DIAGNOSIS — M5126 Other intervertebral disc displacement, lumbar region: Secondary | ICD-10-CM | POA: Diagnosis not present

## 2022-04-14 DIAGNOSIS — M4986 Spondylopathy in diseases classified elsewhere, lumbar region: Secondary | ICD-10-CM | POA: Diagnosis not present

## 2022-04-14 DIAGNOSIS — I2510 Atherosclerotic heart disease of native coronary artery without angina pectoris: Secondary | ICD-10-CM | POA: Diagnosis not present

## 2022-04-14 DIAGNOSIS — M48061 Spinal stenosis, lumbar region without neurogenic claudication: Secondary | ICD-10-CM | POA: Diagnosis not present

## 2022-04-15 DIAGNOSIS — I2510 Atherosclerotic heart disease of native coronary artery without angina pectoris: Secondary | ICD-10-CM | POA: Diagnosis not present

## 2022-04-15 DIAGNOSIS — M48061 Spinal stenosis, lumbar region without neurogenic claudication: Secondary | ICD-10-CM | POA: Diagnosis not present

## 2022-04-15 DIAGNOSIS — M4986 Spondylopathy in diseases classified elsewhere, lumbar region: Secondary | ICD-10-CM | POA: Diagnosis not present

## 2022-04-15 DIAGNOSIS — M6259 Muscle wasting and atrophy, not elsewhere classified, multiple sites: Secondary | ICD-10-CM | POA: Diagnosis not present

## 2022-04-15 DIAGNOSIS — M5126 Other intervertebral disc displacement, lumbar region: Secondary | ICD-10-CM | POA: Diagnosis not present

## 2022-04-15 DIAGNOSIS — I10 Essential (primary) hypertension: Secondary | ICD-10-CM | POA: Diagnosis not present

## 2022-04-18 DIAGNOSIS — M5126 Other intervertebral disc displacement, lumbar region: Secondary | ICD-10-CM | POA: Diagnosis not present

## 2022-04-18 DIAGNOSIS — I2510 Atherosclerotic heart disease of native coronary artery without angina pectoris: Secondary | ICD-10-CM | POA: Diagnosis not present

## 2022-04-18 DIAGNOSIS — M4986 Spondylopathy in diseases classified elsewhere, lumbar region: Secondary | ICD-10-CM | POA: Diagnosis not present

## 2022-04-18 DIAGNOSIS — M48061 Spinal stenosis, lumbar region without neurogenic claudication: Secondary | ICD-10-CM | POA: Diagnosis not present

## 2022-04-18 DIAGNOSIS — I10 Essential (primary) hypertension: Secondary | ICD-10-CM | POA: Diagnosis not present

## 2022-04-18 DIAGNOSIS — M6259 Muscle wasting and atrophy, not elsewhere classified, multiple sites: Secondary | ICD-10-CM | POA: Diagnosis not present

## 2022-04-19 DIAGNOSIS — M4986 Spondylopathy in diseases classified elsewhere, lumbar region: Secondary | ICD-10-CM | POA: Diagnosis not present

## 2022-04-19 DIAGNOSIS — I2510 Atherosclerotic heart disease of native coronary artery without angina pectoris: Secondary | ICD-10-CM | POA: Diagnosis not present

## 2022-04-19 DIAGNOSIS — M48061 Spinal stenosis, lumbar region without neurogenic claudication: Secondary | ICD-10-CM | POA: Diagnosis not present

## 2022-04-19 DIAGNOSIS — M5126 Other intervertebral disc displacement, lumbar region: Secondary | ICD-10-CM | POA: Diagnosis not present

## 2022-04-19 DIAGNOSIS — M6259 Muscle wasting and atrophy, not elsewhere classified, multiple sites: Secondary | ICD-10-CM | POA: Diagnosis not present

## 2022-04-19 DIAGNOSIS — I10 Essential (primary) hypertension: Secondary | ICD-10-CM | POA: Diagnosis not present

## 2022-04-20 DIAGNOSIS — M6281 Muscle weakness (generalized): Secondary | ICD-10-CM | POA: Diagnosis not present

## 2022-04-20 DIAGNOSIS — M4986 Spondylopathy in diseases classified elsewhere, lumbar region: Secondary | ICD-10-CM | POA: Diagnosis not present

## 2022-04-20 DIAGNOSIS — M48061 Spinal stenosis, lumbar region without neurogenic claudication: Secondary | ICD-10-CM | POA: Diagnosis not present

## 2022-04-20 DIAGNOSIS — I10 Essential (primary) hypertension: Secondary | ICD-10-CM | POA: Diagnosis not present

## 2022-04-20 DIAGNOSIS — R634 Abnormal weight loss: Secondary | ICD-10-CM | POA: Diagnosis not present

## 2022-04-20 DIAGNOSIS — M5126 Other intervertebral disc displacement, lumbar region: Secondary | ICD-10-CM | POA: Diagnosis not present

## 2022-04-20 DIAGNOSIS — M6259 Muscle wasting and atrophy, not elsewhere classified, multiple sites: Secondary | ICD-10-CM | POA: Diagnosis not present

## 2022-04-20 DIAGNOSIS — I2510 Atherosclerotic heart disease of native coronary artery without angina pectoris: Secondary | ICD-10-CM | POA: Diagnosis not present

## 2022-04-21 DIAGNOSIS — M48061 Spinal stenosis, lumbar region without neurogenic claudication: Secondary | ICD-10-CM | POA: Diagnosis not present

## 2022-04-21 DIAGNOSIS — R5383 Other fatigue: Secondary | ICD-10-CM | POA: Diagnosis not present

## 2022-04-21 DIAGNOSIS — M1000 Idiopathic gout, unspecified site: Secondary | ICD-10-CM | POA: Diagnosis not present

## 2022-04-21 DIAGNOSIS — Z736 Limitation of activities due to disability: Secondary | ICD-10-CM | POA: Diagnosis not present

## 2022-04-21 DIAGNOSIS — F32A Depression, unspecified: Secondary | ICD-10-CM | POA: Diagnosis not present

## 2022-04-21 DIAGNOSIS — I2510 Atherosclerotic heart disease of native coronary artery without angina pectoris: Secondary | ICD-10-CM | POA: Diagnosis not present

## 2022-04-21 DIAGNOSIS — M4986 Spondylopathy in diseases classified elsewhere, lumbar region: Secondary | ICD-10-CM | POA: Diagnosis not present

## 2022-04-21 DIAGNOSIS — M1380 Other specified arthritis, unspecified site: Secondary | ICD-10-CM | POA: Diagnosis not present

## 2022-04-21 DIAGNOSIS — E785 Hyperlipidemia, unspecified: Secondary | ICD-10-CM | POA: Diagnosis not present

## 2022-04-21 DIAGNOSIS — M5126 Other intervertebral disc displacement, lumbar region: Secondary | ICD-10-CM | POA: Diagnosis not present

## 2022-04-21 DIAGNOSIS — E669 Obesity, unspecified: Secondary | ICD-10-CM | POA: Diagnosis not present

## 2022-04-21 DIAGNOSIS — R52 Pain, unspecified: Secondary | ICD-10-CM | POA: Diagnosis not present

## 2022-04-21 DIAGNOSIS — G8929 Other chronic pain: Secondary | ICD-10-CM | POA: Diagnosis not present

## 2022-04-21 DIAGNOSIS — I10 Essential (primary) hypertension: Secondary | ICD-10-CM | POA: Diagnosis not present

## 2022-04-22 DIAGNOSIS — E785 Hyperlipidemia, unspecified: Secondary | ICD-10-CM | POA: Diagnosis not present

## 2022-04-22 DIAGNOSIS — R52 Pain, unspecified: Secondary | ICD-10-CM | POA: Diagnosis not present

## 2022-04-22 DIAGNOSIS — Z736 Limitation of activities due to disability: Secondary | ICD-10-CM | POA: Diagnosis not present

## 2022-04-22 DIAGNOSIS — I10 Essential (primary) hypertension: Secondary | ICD-10-CM | POA: Diagnosis not present

## 2022-04-22 DIAGNOSIS — I2510 Atherosclerotic heart disease of native coronary artery without angina pectoris: Secondary | ICD-10-CM | POA: Diagnosis not present

## 2022-04-22 DIAGNOSIS — R5383 Other fatigue: Secondary | ICD-10-CM | POA: Diagnosis not present

## 2022-04-25 DIAGNOSIS — R5383 Other fatigue: Secondary | ICD-10-CM | POA: Diagnosis not present

## 2022-04-25 DIAGNOSIS — R52 Pain, unspecified: Secondary | ICD-10-CM | POA: Diagnosis not present

## 2022-04-25 DIAGNOSIS — Z736 Limitation of activities due to disability: Secondary | ICD-10-CM | POA: Diagnosis not present

## 2022-04-25 DIAGNOSIS — I2510 Atherosclerotic heart disease of native coronary artery without angina pectoris: Secondary | ICD-10-CM | POA: Diagnosis not present

## 2022-04-25 DIAGNOSIS — E785 Hyperlipidemia, unspecified: Secondary | ICD-10-CM | POA: Diagnosis not present

## 2022-04-25 DIAGNOSIS — I10 Essential (primary) hypertension: Secondary | ICD-10-CM | POA: Diagnosis not present

## 2022-04-26 DIAGNOSIS — R52 Pain, unspecified: Secondary | ICD-10-CM | POA: Diagnosis not present

## 2022-04-26 DIAGNOSIS — I2510 Atherosclerotic heart disease of native coronary artery without angina pectoris: Secondary | ICD-10-CM | POA: Diagnosis not present

## 2022-04-26 DIAGNOSIS — R5383 Other fatigue: Secondary | ICD-10-CM | POA: Diagnosis not present

## 2022-04-26 DIAGNOSIS — Z736 Limitation of activities due to disability: Secondary | ICD-10-CM | POA: Diagnosis not present

## 2022-04-26 DIAGNOSIS — E785 Hyperlipidemia, unspecified: Secondary | ICD-10-CM | POA: Diagnosis not present

## 2022-04-26 DIAGNOSIS — I10 Essential (primary) hypertension: Secondary | ICD-10-CM | POA: Diagnosis not present

## 2022-04-27 DIAGNOSIS — E785 Hyperlipidemia, unspecified: Secondary | ICD-10-CM | POA: Diagnosis not present

## 2022-04-27 DIAGNOSIS — I10 Essential (primary) hypertension: Secondary | ICD-10-CM | POA: Diagnosis not present

## 2022-04-27 DIAGNOSIS — I2510 Atherosclerotic heart disease of native coronary artery without angina pectoris: Secondary | ICD-10-CM | POA: Diagnosis not present

## 2022-04-27 DIAGNOSIS — R5383 Other fatigue: Secondary | ICD-10-CM | POA: Diagnosis not present

## 2022-04-27 DIAGNOSIS — Z736 Limitation of activities due to disability: Secondary | ICD-10-CM | POA: Diagnosis not present

## 2022-04-27 DIAGNOSIS — R52 Pain, unspecified: Secondary | ICD-10-CM | POA: Diagnosis not present

## 2022-05-11 ENCOUNTER — Other Ambulatory Visit (HOSPITAL_COMMUNITY): Payer: Self-pay | Admitting: Student

## 2022-05-11 ENCOUNTER — Other Ambulatory Visit: Payer: Self-pay | Admitting: Student

## 2022-05-11 DIAGNOSIS — M533 Sacrococcygeal disorders, not elsewhere classified: Secondary | ICD-10-CM

## 2022-05-16 DIAGNOSIS — I10 Essential (primary) hypertension: Secondary | ICD-10-CM | POA: Diagnosis not present

## 2022-05-16 DIAGNOSIS — R5383 Other fatigue: Secondary | ICD-10-CM | POA: Diagnosis not present

## 2022-05-16 DIAGNOSIS — R52 Pain, unspecified: Secondary | ICD-10-CM | POA: Diagnosis not present

## 2022-05-16 DIAGNOSIS — E785 Hyperlipidemia, unspecified: Secondary | ICD-10-CM | POA: Diagnosis not present

## 2022-05-16 DIAGNOSIS — Z736 Limitation of activities due to disability: Secondary | ICD-10-CM | POA: Diagnosis not present

## 2022-05-16 DIAGNOSIS — I2510 Atherosclerotic heart disease of native coronary artery without angina pectoris: Secondary | ICD-10-CM | POA: Diagnosis not present

## 2022-05-17 DIAGNOSIS — I2510 Atherosclerotic heart disease of native coronary artery without angina pectoris: Secondary | ICD-10-CM | POA: Diagnosis not present

## 2022-05-17 DIAGNOSIS — R5383 Other fatigue: Secondary | ICD-10-CM | POA: Diagnosis not present

## 2022-05-17 DIAGNOSIS — E785 Hyperlipidemia, unspecified: Secondary | ICD-10-CM | POA: Diagnosis not present

## 2022-05-17 DIAGNOSIS — I10 Essential (primary) hypertension: Secondary | ICD-10-CM | POA: Diagnosis not present

## 2022-05-17 DIAGNOSIS — Z736 Limitation of activities due to disability: Secondary | ICD-10-CM | POA: Diagnosis not present

## 2022-05-17 DIAGNOSIS — R52 Pain, unspecified: Secondary | ICD-10-CM | POA: Diagnosis not present

## 2022-05-18 DIAGNOSIS — R52 Pain, unspecified: Secondary | ICD-10-CM | POA: Diagnosis not present

## 2022-05-18 DIAGNOSIS — Z736 Limitation of activities due to disability: Secondary | ICD-10-CM | POA: Diagnosis not present

## 2022-05-18 DIAGNOSIS — I2510 Atherosclerotic heart disease of native coronary artery without angina pectoris: Secondary | ICD-10-CM | POA: Diagnosis not present

## 2022-05-18 DIAGNOSIS — I10 Essential (primary) hypertension: Secondary | ICD-10-CM | POA: Diagnosis not present

## 2022-05-18 DIAGNOSIS — R5383 Other fatigue: Secondary | ICD-10-CM | POA: Diagnosis not present

## 2022-05-18 DIAGNOSIS — E785 Hyperlipidemia, unspecified: Secondary | ICD-10-CM | POA: Diagnosis not present

## 2022-05-19 DIAGNOSIS — R5383 Other fatigue: Secondary | ICD-10-CM | POA: Diagnosis not present

## 2022-05-19 DIAGNOSIS — Z736 Limitation of activities due to disability: Secondary | ICD-10-CM | POA: Diagnosis not present

## 2022-05-19 DIAGNOSIS — E785 Hyperlipidemia, unspecified: Secondary | ICD-10-CM | POA: Diagnosis not present

## 2022-05-19 DIAGNOSIS — I2510 Atherosclerotic heart disease of native coronary artery without angina pectoris: Secondary | ICD-10-CM | POA: Diagnosis not present

## 2022-05-19 DIAGNOSIS — R52 Pain, unspecified: Secondary | ICD-10-CM | POA: Diagnosis not present

## 2022-05-19 DIAGNOSIS — I10 Essential (primary) hypertension: Secondary | ICD-10-CM | POA: Diagnosis not present

## 2022-05-20 DIAGNOSIS — R52 Pain, unspecified: Secondary | ICD-10-CM | POA: Diagnosis not present

## 2022-05-20 DIAGNOSIS — I10 Essential (primary) hypertension: Secondary | ICD-10-CM | POA: Diagnosis not present

## 2022-05-20 DIAGNOSIS — R5383 Other fatigue: Secondary | ICD-10-CM | POA: Diagnosis not present

## 2022-05-20 DIAGNOSIS — I2510 Atherosclerotic heart disease of native coronary artery without angina pectoris: Secondary | ICD-10-CM | POA: Diagnosis not present

## 2022-05-20 DIAGNOSIS — E785 Hyperlipidemia, unspecified: Secondary | ICD-10-CM | POA: Diagnosis not present

## 2022-05-20 DIAGNOSIS — Z736 Limitation of activities due to disability: Secondary | ICD-10-CM | POA: Diagnosis not present

## 2022-05-23 DIAGNOSIS — R52 Pain, unspecified: Secondary | ICD-10-CM | POA: Diagnosis not present

## 2022-05-23 DIAGNOSIS — E785 Hyperlipidemia, unspecified: Secondary | ICD-10-CM | POA: Diagnosis not present

## 2022-05-23 DIAGNOSIS — M5126 Other intervertebral disc displacement, lumbar region: Secondary | ICD-10-CM | POA: Diagnosis not present

## 2022-05-23 DIAGNOSIS — M4986 Spondylopathy in diseases classified elsewhere, lumbar region: Secondary | ICD-10-CM | POA: Diagnosis not present

## 2022-05-23 DIAGNOSIS — F32A Depression, unspecified: Secondary | ICD-10-CM | POA: Diagnosis not present

## 2022-05-23 DIAGNOSIS — M1380 Other specified arthritis, unspecified site: Secondary | ICD-10-CM | POA: Diagnosis not present

## 2022-05-23 DIAGNOSIS — I2510 Atherosclerotic heart disease of native coronary artery without angina pectoris: Secondary | ICD-10-CM | POA: Diagnosis not present

## 2022-05-23 DIAGNOSIS — G8929 Other chronic pain: Secondary | ICD-10-CM | POA: Diagnosis not present

## 2022-05-23 DIAGNOSIS — Z736 Limitation of activities due to disability: Secondary | ICD-10-CM | POA: Diagnosis not present

## 2022-05-23 DIAGNOSIS — I10 Essential (primary) hypertension: Secondary | ICD-10-CM | POA: Diagnosis not present

## 2022-05-23 DIAGNOSIS — M1000 Idiopathic gout, unspecified site: Secondary | ICD-10-CM | POA: Diagnosis not present

## 2022-05-23 DIAGNOSIS — E669 Obesity, unspecified: Secondary | ICD-10-CM | POA: Diagnosis not present

## 2022-05-23 DIAGNOSIS — R5383 Other fatigue: Secondary | ICD-10-CM | POA: Diagnosis not present

## 2022-05-23 DIAGNOSIS — M48061 Spinal stenosis, lumbar region without neurogenic claudication: Secondary | ICD-10-CM | POA: Diagnosis not present

## 2022-05-24 DIAGNOSIS — E785 Hyperlipidemia, unspecified: Secondary | ICD-10-CM | POA: Diagnosis not present

## 2022-05-24 DIAGNOSIS — I10 Essential (primary) hypertension: Secondary | ICD-10-CM | POA: Diagnosis not present

## 2022-05-24 DIAGNOSIS — I2510 Atherosclerotic heart disease of native coronary artery without angina pectoris: Secondary | ICD-10-CM | POA: Diagnosis not present

## 2022-05-24 DIAGNOSIS — Z736 Limitation of activities due to disability: Secondary | ICD-10-CM | POA: Diagnosis not present

## 2022-05-24 DIAGNOSIS — R5383 Other fatigue: Secondary | ICD-10-CM | POA: Diagnosis not present

## 2022-05-24 DIAGNOSIS — R52 Pain, unspecified: Secondary | ICD-10-CM | POA: Diagnosis not present

## 2022-05-25 DIAGNOSIS — R52 Pain, unspecified: Secondary | ICD-10-CM | POA: Diagnosis not present

## 2022-05-25 DIAGNOSIS — I10 Essential (primary) hypertension: Secondary | ICD-10-CM | POA: Diagnosis not present

## 2022-05-25 DIAGNOSIS — R5383 Other fatigue: Secondary | ICD-10-CM | POA: Diagnosis not present

## 2022-05-25 DIAGNOSIS — Z736 Limitation of activities due to disability: Secondary | ICD-10-CM | POA: Diagnosis not present

## 2022-05-25 DIAGNOSIS — E785 Hyperlipidemia, unspecified: Secondary | ICD-10-CM | POA: Diagnosis not present

## 2022-05-25 DIAGNOSIS — I2510 Atherosclerotic heart disease of native coronary artery without angina pectoris: Secondary | ICD-10-CM | POA: Diagnosis not present

## 2022-05-26 DIAGNOSIS — I10 Essential (primary) hypertension: Secondary | ICD-10-CM | POA: Diagnosis not present

## 2022-05-26 DIAGNOSIS — R52 Pain, unspecified: Secondary | ICD-10-CM | POA: Diagnosis not present

## 2022-05-26 DIAGNOSIS — R5383 Other fatigue: Secondary | ICD-10-CM | POA: Diagnosis not present

## 2022-05-26 DIAGNOSIS — E785 Hyperlipidemia, unspecified: Secondary | ICD-10-CM | POA: Diagnosis not present

## 2022-05-26 DIAGNOSIS — I2510 Atherosclerotic heart disease of native coronary artery without angina pectoris: Secondary | ICD-10-CM | POA: Diagnosis not present

## 2022-05-26 DIAGNOSIS — Z736 Limitation of activities due to disability: Secondary | ICD-10-CM | POA: Diagnosis not present

## 2022-05-27 DIAGNOSIS — R52 Pain, unspecified: Secondary | ICD-10-CM | POA: Diagnosis not present

## 2022-05-27 DIAGNOSIS — E785 Hyperlipidemia, unspecified: Secondary | ICD-10-CM | POA: Diagnosis not present

## 2022-05-27 DIAGNOSIS — I10 Essential (primary) hypertension: Secondary | ICD-10-CM | POA: Diagnosis not present

## 2022-05-27 DIAGNOSIS — I2510 Atherosclerotic heart disease of native coronary artery without angina pectoris: Secondary | ICD-10-CM | POA: Diagnosis not present

## 2022-05-27 DIAGNOSIS — R5383 Other fatigue: Secondary | ICD-10-CM | POA: Diagnosis not present

## 2022-05-27 DIAGNOSIS — Z736 Limitation of activities due to disability: Secondary | ICD-10-CM | POA: Diagnosis not present

## 2022-05-30 DIAGNOSIS — R5383 Other fatigue: Secondary | ICD-10-CM | POA: Diagnosis not present

## 2022-05-30 DIAGNOSIS — I2510 Atherosclerotic heart disease of native coronary artery without angina pectoris: Secondary | ICD-10-CM | POA: Diagnosis not present

## 2022-05-30 DIAGNOSIS — I10 Essential (primary) hypertension: Secondary | ICD-10-CM | POA: Diagnosis not present

## 2022-05-30 DIAGNOSIS — E785 Hyperlipidemia, unspecified: Secondary | ICD-10-CM | POA: Diagnosis not present

## 2022-05-30 DIAGNOSIS — Z736 Limitation of activities due to disability: Secondary | ICD-10-CM | POA: Diagnosis not present

## 2022-05-30 DIAGNOSIS — R52 Pain, unspecified: Secondary | ICD-10-CM | POA: Diagnosis not present

## 2022-05-31 DIAGNOSIS — Z736 Limitation of activities due to disability: Secondary | ICD-10-CM | POA: Diagnosis not present

## 2022-05-31 DIAGNOSIS — E785 Hyperlipidemia, unspecified: Secondary | ICD-10-CM | POA: Diagnosis not present

## 2022-05-31 DIAGNOSIS — I2510 Atherosclerotic heart disease of native coronary artery without angina pectoris: Secondary | ICD-10-CM | POA: Diagnosis not present

## 2022-05-31 DIAGNOSIS — I10 Essential (primary) hypertension: Secondary | ICD-10-CM | POA: Diagnosis not present

## 2022-05-31 DIAGNOSIS — R5383 Other fatigue: Secondary | ICD-10-CM | POA: Diagnosis not present

## 2022-05-31 DIAGNOSIS — R52 Pain, unspecified: Secondary | ICD-10-CM | POA: Diagnosis not present

## 2022-06-01 DIAGNOSIS — R52 Pain, unspecified: Secondary | ICD-10-CM | POA: Diagnosis not present

## 2022-06-01 DIAGNOSIS — E785 Hyperlipidemia, unspecified: Secondary | ICD-10-CM | POA: Diagnosis not present

## 2022-06-01 DIAGNOSIS — Z736 Limitation of activities due to disability: Secondary | ICD-10-CM | POA: Diagnosis not present

## 2022-06-01 DIAGNOSIS — I10 Essential (primary) hypertension: Secondary | ICD-10-CM | POA: Diagnosis not present

## 2022-06-01 DIAGNOSIS — R5383 Other fatigue: Secondary | ICD-10-CM | POA: Diagnosis not present

## 2022-06-01 DIAGNOSIS — I2510 Atherosclerotic heart disease of native coronary artery without angina pectoris: Secondary | ICD-10-CM | POA: Diagnosis not present

## 2022-06-02 DIAGNOSIS — R310 Gross hematuria: Secondary | ICD-10-CM | POA: Diagnosis not present

## 2022-06-02 DIAGNOSIS — M1000 Idiopathic gout, unspecified site: Secondary | ICD-10-CM | POA: Diagnosis not present

## 2022-06-02 DIAGNOSIS — I10 Essential (primary) hypertension: Secondary | ICD-10-CM | POA: Diagnosis not present

## 2022-06-02 DIAGNOSIS — R52 Pain, unspecified: Secondary | ICD-10-CM | POA: Diagnosis not present

## 2022-06-02 DIAGNOSIS — R5383 Other fatigue: Secondary | ICD-10-CM | POA: Diagnosis not present

## 2022-06-02 DIAGNOSIS — M6281 Muscle weakness (generalized): Secondary | ICD-10-CM | POA: Diagnosis not present

## 2022-06-02 DIAGNOSIS — Z736 Limitation of activities due to disability: Secondary | ICD-10-CM | POA: Diagnosis not present

## 2022-06-02 DIAGNOSIS — I2510 Atherosclerotic heart disease of native coronary artery without angina pectoris: Secondary | ICD-10-CM | POA: Diagnosis not present

## 2022-06-02 DIAGNOSIS — E785 Hyperlipidemia, unspecified: Secondary | ICD-10-CM | POA: Diagnosis not present

## 2022-06-02 DIAGNOSIS — E559 Vitamin D deficiency, unspecified: Secondary | ICD-10-CM | POA: Diagnosis not present

## 2022-06-03 ENCOUNTER — Ambulatory Visit (HOSPITAL_COMMUNITY)
Admission: RE | Admit: 2022-06-03 | Discharge: 2022-06-03 | Disposition: A | Discharge status: Home / Self Care | Payer: Medicare Other | Source: Ambulatory Visit | Attending: Student | Admitting: Student

## 2022-06-03 DIAGNOSIS — I2510 Atherosclerotic heart disease of native coronary artery without angina pectoris: Secondary | ICD-10-CM | POA: Diagnosis not present

## 2022-06-03 DIAGNOSIS — E785 Hyperlipidemia, unspecified: Secondary | ICD-10-CM | POA: Diagnosis not present

## 2022-06-03 DIAGNOSIS — Z736 Limitation of activities due to disability: Secondary | ICD-10-CM | POA: Diagnosis not present

## 2022-06-03 DIAGNOSIS — R5383 Other fatigue: Secondary | ICD-10-CM | POA: Diagnosis not present

## 2022-06-03 DIAGNOSIS — R52 Pain, unspecified: Secondary | ICD-10-CM | POA: Diagnosis not present

## 2022-06-03 DIAGNOSIS — M47816 Spondylosis without myelopathy or radiculopathy, lumbar region: Secondary | ICD-10-CM | POA: Diagnosis not present

## 2022-06-03 DIAGNOSIS — M533 Sacrococcygeal disorders, not elsewhere classified: Secondary | ICD-10-CM | POA: Diagnosis not present

## 2022-06-03 DIAGNOSIS — I10 Essential (primary) hypertension: Secondary | ICD-10-CM | POA: Diagnosis not present

## 2022-06-03 MED ORDER — GADOBUTROL 1 MMOL/ML IV SOLN
10.0000 mL | Freq: Once | INTRAVENOUS | Status: AC | PRN
  Administered 2022-06-03: 10 mL via INTRAVENOUS

## 2022-06-07 DIAGNOSIS — M533 Sacrococcygeal disorders, not elsewhere classified: Secondary | ICD-10-CM | POA: Diagnosis not present

## 2022-06-07 DIAGNOSIS — M5126 Other intervertebral disc displacement, lumbar region: Secondary | ICD-10-CM | POA: Diagnosis not present

## 2022-06-10 ENCOUNTER — Telehealth: Payer: Self-pay | Admitting: Physician Assistant

## 2022-06-10 NOTE — Telephone Encounter (Signed)
Scheduled appt per 7/19 referral. Spoke to representative at [facility], facility that pt is at. They are aware of appt date/time.

## 2022-06-13 NOTE — Progress Notes (Signed)
[place] [phone]   [phone]  INITIAL CONSULTATION:  Patient Care Team: Shon Baton, MD as PCP - General (Internal Medicine)  CHIEF COMPLAINTS/PURPOSE OF CONSULTATION:  Sacral lesions  HISTORY OF PRESENTING ILLNESS:  Sean [name] 76 y.o. male with medical history significant for arthritis, carpal tunnel syndrome, CAD, prostate cancer, renal cell carcinoma and hypertension. He presents today after recent MRI imaging shows sacral lesions. He is accompanied by his brother and sister-in-law for this visit.   On review of the previous records, Sean [name] was evaluated by Dr. Saintclair Halsted, neurosurgeon, due to ongoing bilateral lower extremity weakness and multiple falls since January 2023. He first underwent MRI of the lumbar spine on 03/22/2022 that showed abnormal marrow signal with enhancement in the central sacrum at S2-S3, near the exiting left S2 nerve. MRI of the sacrum was done on 06/03/2022 showed indeterminate 14 mm bone lesion the superior aspect of the S3 vertebral body and small adjacent area of similar abnormality in the inferior aspect of the S2 vertebral body adjacent to the endplate.   On exam today, Sean [name] reports that he continues to have bilateral lower extremity weakness, right greater than left side. He adds that he was unable to walk since February 2023. He uses a wheelchair to ambulate. He reports occasional episodes of low back pain and denies any neuropathy. He denies any changes to his energy levels and has a good appetite. He denies nausea, vomiting or abdominal pain. His bowel habits are unchanged without recurrent episodes of diarrhea or constipation. He denies easy bruising or signs of bleeding. He denies fevers, chills, night sweats, shortness of breath, chest pain, cough, urinary symptoms. He has no other complaints. Rest of the 10 point ROS is below.    MEDICAL HISTORY:  Past Medical History:  Diagnosis  Date   Arthritis    Carpal tunnel syndrome    bilat    Cataract    forming    Coronary artery disease CARDIOLOGIST- DR CRENSHAW   Depression    history of at age [age] years old; denies current issues    H/O simple renal cyst    Hematuria    History of concussion AS  CHILD-- NO RESIDUAL   History of kidney stones    History of prostate cancer    S/P RADIOACTIVE SEED IMPLANTS AND EXTERNAL RADIATION---  NO RECURRENCE   History of radiation therapy    25 treatments    History of urinary tract infection    History of vertigo    Hypertension    Nocturia    PONV (postoperative nausea and vomiting)    Prostate cancer (HCC)    radiation prior to seed implants    Shortness of breath dyspnea    with increased exertion    Urethral stricture    Wears glasses     SURGICAL HISTORY: Past Surgical History:  Procedure Laterality Date   CARDIAC CATHETERIZATION  12-18-2003   SEVERE TWO VESSEL CAD/  FIRST DIAGONAL  95% STENOSIS/ CIRCUMFLEX 95% STENOSIS IN THE OSTIUM OF LARGE FIRST OM THAT WAS OCCLUDED AFTER THE FIRST MARGINAL/  COLLATERAL FLOW FROM THE LAD TO THE OBTUSE MARGINALS/  EF 50%---- MEDICAL MANAGEMENT   CARDIOVASCULAR STRESS TEST  05-16-2011   DR CRENSHAW   PROBABLE NORMAL PERFUSION AND SOFT TISSUE ATTENUATION/ NO SIGNIFICANT ISCHEMIA OR SCAR /  EF  59%   COLONOSCOPY     COLONSCOPY WITH POLYPS REMOVED     CYSTOSCOPY  N/A 06/06/2016   Procedure: CYSTOSCOPY FLEXIBLE;  Surgeon: Raynelle Bring, MD;  Location: [facility];  Service: Urology;  Laterality: N/A;   CYSTOSCOPY WITH URETHRAL DILATATION  04/25/2012   Procedure: CYSTOSCOPY WITH URETHRAL DILATATION;  Surgeon: Franchot Gallo, MD;  Location: [facility];  Service: Urology;  Laterality: N/A;   CYSTOSCOPY, BALLOON DILATION OF URETHRAL STRICTURE   CYSTOSCOPY WITH URETHRAL DILATATION N/A 04/12/2013   Procedure: CYSTOSCOPY WITH URETHRAL DILATATION;  Surgeon: Franchot Gallo, MD;  Location: [facility];  Service:  Urology;  Laterality: N/A;  45 MIN    LUMBAR LAMINECTOMY/DECOMPRESSION MICRODISCECTOMY Left 09/24/2021   Procedure: Left Sided Lumbar Two- Three Microdisectomy;  Surgeon: Kary Kos, MD;  Location: [place];  Service: Neurosurgery;  Laterality: Left;   POLYPECTOMY     RADIOACTIVE SEED IMPLANTS, PROSTATE  11-26-2010   RIGHT URETEROSCOPIC STONE EXTRACTION  09-04-2003   ROBOTIC ASSITED PARTIAL NEPHRECTOMY Left 06/06/2016   Procedure: XI ROBOTIC ASSITED PARTIAL NEPHRECTOMY;  Surgeon: Raynelle Bring, MD;  Location: [facility];  Service: Urology;  Laterality: Left;   TRANSTHORACIC ECHOCARDIOGRAM  09-22-2003   LVSF NORMAL/  MILD TO MODERATE PULMONARY REGURG.    SOCIAL HISTORY: Social History   Socioeconomic History   Marital status: Single    Spouse name: Not on file   Number of children: Not on file   Years of education: Not on file   Highest education level: Not on file  Occupational History   Not on file  Tobacco Use   Smoking status: Never   Smokeless tobacco: Never  Substance and Sexual Activity   Alcohol use: No   Drug use: No   Sexual activity: Not on file  Other Topics Concern   Not on file  Social History Narrative   Not on file   Social Determinants of Health   Financial Resource Strain: Not on file  Food Insecurity: Not on file  Transportation Needs: Not on file  Physical Activity: Not on file  Stress: Not on file  Social Connections: Not on file  Intimate Partner Violence: Not on file    FAMILY HISTORY: Family History  Problem Relation Age of Onset   Diabetes Father    Colon cancer Maternal Uncle    Esophageal cancer Neg Hx    Rectal cancer Neg Hx    Stomach cancer Neg Hx    Colon polyps Neg Hx     ALLERGIES:  is allergic to morphine and related, codeine, and lariam [mefloquine hcl].  MEDICATIONS:  Current Outpatient Medications  Medication Sig Dispense Refill   allopurinol (ZYLOPRIM) 100 MG tablet Take 200 mg by mouth every evening.      APPLE CIDER VINEGAR  PO Take 450 mg by mouth daily.     Ascorbic Acid (VITAMIN C) 1000 MG tablet Take 2,000 mg by mouth daily.     aspirin 81 MG chewable tablet Chew 81 mg by mouth daily.     atorvastatin (LIPITOR) 80 MG tablet Take 40 mg by mouth every evening.     Cinnamon 500 MG TABS Take 1,000 mg by mouth daily.     DULoxetine (CYMBALTA) 60 MG capsule Take 60 mg by mouth daily.     ibuprofen (ADVIL) 200 MG tablet Take 600 mg by mouth every 6 (six) hours as needed for mild pain.     metoprolol succinate (TOPROL-XL) 50 MG 24 hr tablet Take 100 mg by mouth every morning. Take with or immediately following a meal.     Misc Natural Products (  TART CHERRY ADVANCED PO) Take 1,200 mg by mouth daily.     Multiple Vitamin (MULTIVITAMIN) tablet Take 1 tablet by mouth daily.     polyethylene glycol (MIRALAX / GLYCOLAX) 17 g packet Take 17 g by mouth daily as needed for moderate constipation.     ramipril (ALTACE) 10 MG capsule Take 10 mg by mouth daily.      Turmeric 400 MG CAPS Take 1,600 mg by mouth daily. Take 4 tabs oral once a day per [facility] Medication Care Document     No current facility-administered medications for this visit.    REVIEW OF SYSTEMS:   Constitutional: ( - ) fevers, ( - )  chills , ( - ) night sweats Eyes: ( - ) blurriness of vision, ( - ) double vision, ( - ) watery eyes Ears, nose, mouth, throat, and face: ( - ) mucositis, ( - ) sore throat Respiratory: ( - ) cough, ( - ) dyspnea, ( - ) wheezes Cardiovascular: ( - ) palpitation, ( - ) chest discomfort, ( - ) lower extremity swelling Gastrointestinal:  ( - ) nausea, ( - ) heartburn, ( - ) change in bowel habits Skin: ( - ) abnormal skin rashes Lymphatics: ( - ) new lymphadenopathy, ( - ) easy bruising Neurological: ( - ) numbness, ( - ) tingling, ( + ) new weaknesses Behavioral/Psych: ( - ) mood change, ( - ) new changes  All other systems were reviewed with the patient and are negative.  PHYSICAL EXAMINATION: ECOG PERFORMANCE STATUS:  3 - Symptomatic, >50% confined to bed  Vitals:   06/14/22 1207  BP: 116/78  Pulse: 74  Resp: 18  Temp: (!) 97.5 F (36.4 C)  SpO2: 99%   Filed Weights    GENERAL: well appearing male in NAD, patient was in wheelchair during entire visit.  SKIN: skin color, texture, turgor are normal, no rashes or significant lesions EYES: conjunctiva are pink and non-injected, sclera clear OROPHARYNX: no exudate, no erythema; lips, buccal mucosa, and tongue normal  NECK: supple, non-tender LYMPH:  no palpable lymphadenopathy in the cervical or supraclavicular lymph nodes.  LUNGS: clear to auscultation and percussion with normal breathing effort HEART: regular rate & rhythm and no murmurs and no lower extremity edema ABDOMEN: soft, non-tender, non-distended, normal bowel sounds Musculoskeletal: no cyanosis of digits and no clubbing  PSYCH: alert & oriented x 3, fluent speech NEURO: no focal motor/sensory deficits  LABORATORY DATA:  I have reviewed the data as listed    Latest Ref Rng & Units 06/14/2022    1:05 PM 12/31/2021   10:02 AM 09/22/2021    6:10 AM  CBC  WBC 4.0 - 10.5 K/uL 8.4  6.2  11.7   Hemoglobin 13.0 - 17.0 g/dL 14.1  13.3  16.3   Hematocrit 39.0 - 52.0 % 40.7  39.1  46.1   Platelets 150 - 400 K/uL 184  153  166        Latest Ref Rng & Units 06/14/2022    1:05 PM 12/31/2021   10:02 AM 09/25/2021    4:22 AM  CMP  Glucose 70 - 99 mg/dL 109  117  160   BUN 8 - 23 mg/dL '24  19  27   ' Creatinine 0.61 - 1.24 mg/dL 0.90  0.84  0.93   Sodium 135 - 145 mmol/L 140  142  138   Potassium 3.5 - 5.1 mmol/L 4.2  3.4  3.8   Chloride 98 - 111 mmol/L  105  107  106   CO2 22 - 32 mmol/L '28  24  25   ' Calcium 8.9 - 10.3 mg/dL 9.0  8.4  8.3   Total Protein 6.5 - 8.1 g/dL 6.5     Total Bilirubin 0.3 - 1.2 mg/dL 0.6     Alkaline Phos 38 - 126 U/L 93     AST 15 - 41 U/L 17     ALT 0 - 44 U/L 16        RADIOGRAPHIC STUDIES: I have personally reviewed the radiological images as listed and  agreed with the findings in the report. MR SACRUM SI JOINTS W WO CONTRAST  Result Date: 06/06/2022 CLINICAL DATA:  Abnormal marrow signal in the S2-S3 EXAM: MRI SACRUM WITHOUT CONTRAST TECHNIQUE: Multiplanar multi-sequence MR imaging of the sacrum was performed. No intravenous contrast was administered. COMPARISON:  MRI lumbar spine 03/22/2022 FINDINGS: Bones/Joint/Cartilage No fracture or dislocation. Normal alignment. No joint effusion. 14 mm area of T2 hyperintensity in the superior aspect of the of the S3 vertebral body and small adjacent area of T2 hyperintensity in the inferior aspect of the S2 vertebral body adjacent to the endplate. This is of indeterminate etiology. Hemangioma in the L5 vertebral body. No SI joint widening or erosive changes. No subchondral reactive marrow changes. No SI joint effusion. Moderate osteoarthritis of the sacroiliac joints bilaterally. Degenerative disease with mild disc height loss L3-4, L4-5 and L5-S1. Grade 1 anterolisthesis of L4 on L5 secondary to facet disease. Severe bilateral facet disease at L4-5 with mild-moderate spinal stenosis, bilateral lateral recess stenosis and severe right foraminal stenosis. At L5-S1 there is moderate bilateral foraminal stenosis, bilateral facet arthropathy, and a broad-based disc bulge. Ligaments, Muscles and Tendons No muscle atrophy. Mild muscle edema in the left multifidus muscle likely reflecting mild muscle strain. Mild muscle edema in the gluteus minimus muscle bilaterally. Piriformis muscles are normal bilaterally without signal abnormality. Soft tissue No fluid collection or hematoma. No soft tissue mass. Normal neurovascular bundles. Fat containing right inguinal hernia. IMPRESSION: 1. Indeterminate 14 mm bone lesion in the superior aspect of the of the S3 vertebral body and small adjacent area of similar abnormality in the inferior aspect of the S2 vertebral body adjacent to the endplate. Differential considerations include  metastatic disease given the area is new compared with the prior MRI dated 09/22/2021 versus a nondisplaced fracture and reactive edema. Recommend correlation with laboratory values. A follow-up MRI of the sacrum is recommended in 3 months for re-evaluation. 2. Lower lumbar spine spondylosis as described above. Electronically Signed   By: Kathreen Devoid M.D.   On: 06/06/2022 13:58    ASSESSMENT & PLAN Sean [name] is a 76 y.o. male who presents to the diagnostic clinic for evaluation for sacral lesions. We reviewed possible etiologies including benign lesions, nondisplaced fracture, reactive edema versus metastatic disease.   The recommended workup includes serologic workup today followed by CT imaging of the chest, abdomen and pelvis. If there is no other target lesion identified in the upcoming CT imaging, we recommend a bone biopsy. Due to small size of the bone lesion, if biopsy is not amenable, we recommend close surveillance with repeat imaging in 3 months.   Sacral bone lesions: --Labs today to check CBC, CMP, LDH, SPEP with IFE, sFLC, PSA level --Need CT chest, abdomen and pelvis --Based on CT imaging, need to determine if biopsy for tissue sampling is feasible.  --RTC once workup is complete  #Prostate cancer:  --Diagnosed  in 2012, treated with radiation therapy and seed implantation.  --Currently on surveillance. Will repeat PSA level to evaluate for recurrence.   #Left renal cell carcinoma: --Underwent partial nephrectomy in 2017.  --Currently on surveillance  #Lower extremity weakness: --Under the care of neurosurgery --Suspect underlying spinal stenosis, facet disease, disc bulging are contributing factors.   Orders Placed This Encounter  Procedures   CT CHEST ABDOMEN PELVIS W CONTRAST    Standing Status:   Future    Standing Expiration Date:   06/15/2023    Order Specific Question:   Preferred imaging location?    Answer:   [facility]    Order Specific  Question:   Is Oral Contrast requested for this exam?    Answer:   Yes, Per Radiology protocol   CBC with Differential ([place] Only)    Standing Status:   Future    Number of Occurrences:   1    Standing Expiration Date:   06/15/2023   CMP ([place] only)    Standing Status:   Future    Number of Occurrences:   1    Standing Expiration Date:   06/15/2023   Lactate dehydrogenase (LDH)    Standing Status:   Future    Number of Occurrences:   1    Standing Expiration Date:   06/14/2023   Multiple Myeloma Panel (SPEP&IFE w/QIG)    Standing Status:   Future    Number of Occurrences:   1    Standing Expiration Date:   06/14/2023   Kappa/lambda light chains    Standing Status:   Future    Number of Occurrences:   1    Standing Expiration Date:   06/14/2023   Prostate-Specific AG, Serum    Standing Status:   Future    Number of Occurrences:   1    Standing Expiration Date:   06/15/2023    All questions were answered. The patient knows to call the clinic with any problems, questions or concerns.  I have spent a total of 60 minutes minutes of face-to-face and non-face-to-face time, preparing to see the patient, obtaining and/or reviewing separately obtained history, performing a medically appropriate examination, counseling and educating the patient, ordering tests/procedures documenting clinical information in the electronic health record,  and care coordination.   Dede Query, PA-C Department of Hematology/Oncology [place] at [facility] Phone: [phone]  Patient was seen with Dr. Lorenso Courier  I have read the above note and personally examined the patient. I agree with the assessment and plan as noted above.  Briefly Sean [name] is a 76 year old male who presents for evaluation of an incidentally discovered small sacral lesion.  The patient did have a prior history of prostate cancer treated with radiation therapy and seed implantation as well as renal  carcinoma treated with partial nephrectomy.  The patient has lower extremity weakness which is currently being evaluated by neurosurgery, however at this time very low suspicion that this small sacral lesion is contributing.  14 mm lesion is quite small and I do suspect they will not be able to reach it via IR intervention.  In the event biopsy is not feasible would recommend serial imaging.  Also we will conduct a CT chest abdomen pelvis to assure no metastatic disease elsewhere in the body.  We will do full serological studies to include multiple myeloma labs and PSA to rule out other possible malignancies.  The patient voiced understanding of the plan  moving forward.  Ledell Peoples, MD Department of Hematology/Oncology [place] at [facility] Phone: [phone] Pager: [phone] Email: [email or web address]

## 2022-06-14 ENCOUNTER — Inpatient Hospital Stay: Payer: Medicare Other

## 2022-06-14 ENCOUNTER — Inpatient Hospital Stay: Payer: Medicare Other | Attending: Physician Assistant | Admitting: Physician Assistant

## 2022-06-14 ENCOUNTER — Encounter: Payer: Self-pay | Admitting: Physician Assistant

## 2022-06-14 ENCOUNTER — Telehealth: Payer: Self-pay

## 2022-06-14 ENCOUNTER — Other Ambulatory Visit: Payer: Self-pay

## 2022-06-14 VITALS — BP 116/78 | HR 74 | Temp 97.5°F | Resp 18 | Ht 70.0 in

## 2022-06-14 DIAGNOSIS — Z833 Family history of diabetes mellitus: Secondary | ICD-10-CM | POA: Diagnosis not present

## 2022-06-14 DIAGNOSIS — Z79899 Other long term (current) drug therapy: Secondary | ICD-10-CM | POA: Diagnosis not present

## 2022-06-14 DIAGNOSIS — I2510 Atherosclerotic heart disease of native coronary artery without angina pectoris: Secondary | ICD-10-CM

## 2022-06-14 DIAGNOSIS — R262 Difficulty in walking, not elsewhere classified: Secondary | ICD-10-CM | POA: Insufficient documentation

## 2022-06-14 DIAGNOSIS — Z800 Family history of malignant neoplasm of digestive organs: Secondary | ICD-10-CM | POA: Diagnosis not present

## 2022-06-14 DIAGNOSIS — M545 Low back pain, unspecified: Secondary | ICD-10-CM | POA: Diagnosis not present

## 2022-06-14 DIAGNOSIS — Z87440 Personal history of urinary (tract) infections: Secondary | ICD-10-CM

## 2022-06-14 DIAGNOSIS — Z8546 Personal history of malignant neoplasm of prostate: Secondary | ICD-10-CM

## 2022-06-14 DIAGNOSIS — Z905 Acquired absence of kidney: Secondary | ICD-10-CM | POA: Diagnosis not present

## 2022-06-14 DIAGNOSIS — I10 Essential (primary) hypertension: Secondary | ICD-10-CM | POA: Diagnosis not present

## 2022-06-14 DIAGNOSIS — M533 Sacrococcygeal disorders, not elsewhere classified: Secondary | ICD-10-CM | POA: Diagnosis not present

## 2022-06-14 DIAGNOSIS — R531 Weakness: Secondary | ICD-10-CM | POA: Diagnosis not present

## 2022-06-14 DIAGNOSIS — Z87442 Personal history of urinary calculi: Secondary | ICD-10-CM | POA: Insufficient documentation

## 2022-06-14 DIAGNOSIS — M1990 Unspecified osteoarthritis, unspecified site: Secondary | ICD-10-CM | POA: Insufficient documentation

## 2022-06-14 DIAGNOSIS — Z885 Allergy status to narcotic agent status: Secondary | ICD-10-CM | POA: Insufficient documentation

## 2022-06-14 DIAGNOSIS — R948 Abnormal results of function studies of other organs and systems: Secondary | ICD-10-CM

## 2022-06-14 DIAGNOSIS — M899 Disorder of bone, unspecified: Secondary | ICD-10-CM

## 2022-06-14 DIAGNOSIS — Z85528 Personal history of other malignant neoplasm of kidney: Secondary | ICD-10-CM | POA: Insufficient documentation

## 2022-06-14 LAB — CMP (CANCER CENTER ONLY)
ALT: 16 U/L (ref 0–44)
AST: 17 U/L (ref 15–41)
Albumin: 3.8 g/dL (ref 3.5–5.0)
Alkaline Phosphatase: 93 U/L (ref 38–126)
Anion gap: 7 (ref 5–15)
BUN: 24 mg/dL — ABNORMAL HIGH (ref 8–23)
CO2: 28 mmol/L (ref 22–32)
Calcium: 9 mg/dL (ref 8.9–10.3)
Chloride: 105 mmol/L (ref 98–111)
Creatinine: 0.9 mg/dL (ref 0.61–1.24)
GFR, Estimated: >60 mL/min (ref >60)
Glucose, Bld: 109 mg/dL — ABNORMAL HIGH (ref 70–99)
Potassium: 4.2 mmol/L (ref 3.5–5.1)
Sodium: 140 mmol/L (ref 135–145)
Total Bilirubin: 0.6 mg/dL (ref 0.3–1.2)
Total Protein: 6.5 g/dL (ref 6.5–8.1)

## 2022-06-14 LAB — CBC WITH DIFFERENTIAL (CANCER CENTER ONLY)
Abs Immature Granulocytes: 0.03 10*3/uL (ref 0.00–0.07)
Basophils Absolute: 0.1 10*3/uL (ref 0.0–0.1)
Basophils Relative: 1 %
Eosinophils Absolute: 0.4 10*3/uL (ref 0.0–0.5)
Eosinophils Relative: 5 %
HCT: 40.7 % (ref 39.0–52.0)
Hemoglobin: 14.1 g/dL (ref 13.0–17.0)
Immature Granulocytes: 0 %
Lymphocytes Relative: 20 %
Lymphs Abs: 1.7 10*3/uL (ref 0.7–4.0)
MCH: 32.7 pg (ref 26.0–34.0)
MCHC: 34.6 g/dL (ref 30.0–36.0)
MCV: 94.4 fL (ref 80.0–100.0)
Monocytes Absolute: 0.9 10*3/uL (ref 0.1–1.0)
Monocytes Relative: 11 %
Neutro Abs: 5.2 10*3/uL (ref 1.7–7.7)
Neutrophils Relative %: 63 %
Platelet Count: 184 10*3/uL (ref 150–400)
RBC: 4.31 MIL/uL (ref 4.22–5.81)
RDW: 13.2 % (ref 11.5–15.5)
WBC Count: 8.4 10*3/uL (ref 4.0–10.5)
nRBC: 0 % (ref 0.0–0.2)

## 2022-06-14 LAB — LACTATE DEHYDROGENASE: LDH: 129 U/L (ref 98–192)

## 2022-06-14 NOTE — Telephone Encounter (Signed)
Called and spoke with patient's nurse at [facility]. CT scan scheduled for [facility] on 06/16/22 @ 9:30 AM with arrival at 9 am. NPO at 5:30 AM. Drink one bottle 2 hours prior and 1 bottle one hour prior to scan. Spoke with Public house manager.

## 2022-06-15 LAB — KAPPA/LAMBDA LIGHT CHAINS
Kappa free light chain: 39.6 mg/L — ABNORMAL HIGH (ref 3.3–19.4)
Kappa, lambda light chain ratio: 1.74 — ABNORMAL HIGH (ref 0.26–1.65)
Lambda free light chains: 22.7 mg/L (ref 5.7–26.3)

## 2022-06-15 LAB — PROSTATE-SPECIFIC AG, SERUM (LABCORP): Prostate Specific Ag, Serum: <0.1 ng/mL (ref 0.0–4.0)

## 2022-06-16 ENCOUNTER — Ambulatory Visit: Payer: Medicare Other

## 2022-06-20 LAB — MULTIPLE MYELOMA PANEL, SERUM
Albumin SerPl Elph-Mcnc: 3.2 g/dL (ref 2.9–4.4)
Albumin/Glob SerPl: 1.1 (ref 0.7–1.7)
Alpha 1: 0.3 g/dL (ref 0.0–0.4)
Alpha2 Glob SerPl Elph-Mcnc: 0.8 g/dL (ref 0.4–1.0)
B-Globulin SerPl Elph-Mcnc: 1 g/dL (ref 0.7–1.3)
Gamma Glob SerPl Elph-Mcnc: 1 g/dL (ref 0.4–1.8)
Globulin, Total: 3 g/dL (ref 2.2–3.9)
IgA: 282 mg/dL (ref 61–437)
IgG (Immunoglobin G), Serum: 964 mg/dL (ref 603–1613)
IgM (Immunoglobulin M), Srm: 67 mg/dL (ref 15–143)
Total Protein ELP: 6.2 g/dL (ref 6.0–8.5)

## 2022-06-22 ENCOUNTER — Ambulatory Visit
Admission: RE | Admit: 2022-06-22 | Discharge: 2022-06-22 | Disposition: A | Discharge status: Home / Self Care | Payer: Medicare Other | Source: Ambulatory Visit | Attending: Physician Assistant | Admitting: Physician Assistant

## 2022-06-22 DIAGNOSIS — R948 Abnormal results of function studies of other organs and systems: Secondary | ICD-10-CM | POA: Insufficient documentation

## 2022-06-22 DIAGNOSIS — M899 Disorder of bone, unspecified: Secondary | ICD-10-CM | POA: Diagnosis not present

## 2022-06-22 DIAGNOSIS — I700 Atherosclerosis of aorta: Secondary | ICD-10-CM | POA: Diagnosis not present

## 2022-06-22 DIAGNOSIS — R911 Solitary pulmonary nodule: Secondary | ICD-10-CM | POA: Diagnosis not present

## 2022-06-22 DIAGNOSIS — N281 Cyst of kidney, acquired: Secondary | ICD-10-CM | POA: Diagnosis not present

## 2022-06-22 DIAGNOSIS — C61 Malignant neoplasm of prostate: Secondary | ICD-10-CM | POA: Diagnosis not present

## 2022-06-22 DIAGNOSIS — N200 Calculus of kidney: Secondary | ICD-10-CM | POA: Diagnosis not present

## 2022-06-22 DIAGNOSIS — C649 Malignant neoplasm of unspecified kidney, except renal pelvis: Secondary | ICD-10-CM | POA: Diagnosis not present

## 2022-06-22 DIAGNOSIS — I2510 Atherosclerotic heart disease of native coronary artery without angina pectoris: Secondary | ICD-10-CM | POA: Diagnosis not present

## 2022-06-22 MED ORDER — IOHEXOL 300 MG/ML  SOLN
100.0000 mL | Freq: Once | INTRAMUSCULAR | Status: AC | PRN
  Administered 2022-06-22: 100 mL via INTRAVENOUS

## 2022-06-23 ENCOUNTER — Other Ambulatory Visit: Payer: Self-pay | Admitting: Physician Assistant

## 2022-06-23 DIAGNOSIS — M899 Disorder of bone, unspecified: Secondary | ICD-10-CM

## 2022-06-23 NOTE — Progress Notes (Unsigned)
I spoke to Mr. Sean [name]'s brother, [name], to discuss the CT scan results from yesterday. Findings continue to show sclerotic lesion int he left sacrum along the SI joint. No other target lesions amenable for biopsy. We will request for a biopsy of the sacral lesion. If IR feels that biopsy is not feasible, we will monitor closely with repeat imaging in 3 months. We will reach out to the providers at [place] (patient's current residence) to discuss our recommendations.

## 2022-06-24 ENCOUNTER — Telehealth: Payer: Self-pay

## 2022-06-24 DIAGNOSIS — G8929 Other chronic pain: Secondary | ICD-10-CM | POA: Diagnosis not present

## 2022-06-24 DIAGNOSIS — M6281 Muscle weakness (generalized): Secondary | ICD-10-CM | POA: Diagnosis not present

## 2022-06-24 NOTE — Telephone Encounter (Signed)
Can you reach out to this patient's team at [facility] let them know that we will be request a biopsy of the sacral mass that was seen on recent scans.   [name] at [place] advised

## 2022-06-30 ENCOUNTER — Telehealth: Payer: Self-pay | Admitting: Physician Assistant

## 2022-06-30 DIAGNOSIS — M533 Sacrococcygeal disorders, not elsewhere classified: Secondary | ICD-10-CM

## 2022-06-30 DIAGNOSIS — M899 Disorder of bone, unspecified: Secondary | ICD-10-CM

## 2022-06-30 NOTE — Telephone Encounter (Signed)
I spoke to patient's brother, Jashua Knaak, to notify him that IR did not recommend biopsy of sacral lesion as lesion is too small and in a difficult position. We will see patient back in 3 months with repeat MRI sacrum.

## 2022-06-30 NOTE — Telephone Encounter (Signed)
Per 8/10 secure chat spoke to pt brother about appointment

## 2022-07-01 DIAGNOSIS — G8929 Other chronic pain: Secondary | ICD-10-CM | POA: Diagnosis not present

## 2022-07-01 DIAGNOSIS — I10 Essential (primary) hypertension: Secondary | ICD-10-CM | POA: Diagnosis not present

## 2022-07-01 DIAGNOSIS — M6281 Muscle weakness (generalized): Secondary | ICD-10-CM | POA: Diagnosis not present

## 2022-07-05 DIAGNOSIS — I10 Essential (primary) hypertension: Secondary | ICD-10-CM | POA: Diagnosis not present

## 2022-07-05 DIAGNOSIS — M6281 Muscle weakness (generalized): Secondary | ICD-10-CM | POA: Diagnosis not present

## 2022-07-07 DIAGNOSIS — M5416 Radiculopathy, lumbar region: Secondary | ICD-10-CM | POA: Diagnosis not present

## 2022-07-19 DIAGNOSIS — H902 Conductive hearing loss, unspecified: Secondary | ICD-10-CM | POA: Diagnosis not present

## 2022-07-19 DIAGNOSIS — H6123 Impacted cerumen, bilateral: Secondary | ICD-10-CM | POA: Diagnosis not present

## 2022-07-22 DIAGNOSIS — R2681 Unsteadiness on feet: Secondary | ICD-10-CM | POA: Diagnosis not present

## 2022-07-25 DIAGNOSIS — R2681 Unsteadiness on feet: Secondary | ICD-10-CM | POA: Diagnosis not present

## 2022-07-26 DIAGNOSIS — R2681 Unsteadiness on feet: Secondary | ICD-10-CM | POA: Diagnosis not present

## 2022-07-27 DIAGNOSIS — R2681 Unsteadiness on feet: Secondary | ICD-10-CM | POA: Diagnosis not present

## 2022-07-29 DIAGNOSIS — R2681 Unsteadiness on feet: Secondary | ICD-10-CM | POA: Diagnosis not present

## 2022-07-29 DIAGNOSIS — M5416 Radiculopathy, lumbar region: Secondary | ICD-10-CM | POA: Diagnosis not present

## 2022-08-01 DIAGNOSIS — M6281 Muscle weakness (generalized): Secondary | ICD-10-CM | POA: Diagnosis not present

## 2022-08-01 DIAGNOSIS — E559 Vitamin D deficiency, unspecified: Secondary | ICD-10-CM | POA: Diagnosis not present

## 2022-08-01 DIAGNOSIS — G8929 Other chronic pain: Secondary | ICD-10-CM | POA: Diagnosis not present

## 2022-08-01 DIAGNOSIS — R2681 Unsteadiness on feet: Secondary | ICD-10-CM | POA: Diagnosis not present

## 2022-08-01 DIAGNOSIS — I10 Essential (primary) hypertension: Secondary | ICD-10-CM | POA: Diagnosis not present

## 2022-08-02 DIAGNOSIS — H902 Conductive hearing loss, unspecified: Secondary | ICD-10-CM | POA: Diagnosis not present

## 2022-08-02 DIAGNOSIS — R2681 Unsteadiness on feet: Secondary | ICD-10-CM | POA: Diagnosis not present

## 2022-08-02 DIAGNOSIS — H6123 Impacted cerumen, bilateral: Secondary | ICD-10-CM | POA: Diagnosis not present

## 2022-08-03 DIAGNOSIS — R2681 Unsteadiness on feet: Secondary | ICD-10-CM | POA: Diagnosis not present

## 2022-08-04 ENCOUNTER — Encounter: Payer: Self-pay | Admitting: Emergency Medicine

## 2022-08-04 ENCOUNTER — Other Ambulatory Visit: Payer: Self-pay

## 2022-08-04 ENCOUNTER — Inpatient Hospital Stay
Admission: EM | Admit: 2022-08-04 | Discharge: 2022-08-21 | DRG: 951 | Disposition: E | Payer: Medicare Other | Source: Skilled Nursing Facility | Attending: Internal Medicine | Admitting: Internal Medicine

## 2022-08-04 ENCOUNTER — Emergency Department: Payer: Medicare Other

## 2022-08-04 DIAGNOSIS — R1110 Vomiting, unspecified: Secondary | ICD-10-CM | POA: Diagnosis present

## 2022-08-04 DIAGNOSIS — Z800 Family history of malignant neoplasm of digestive organs: Secondary | ICD-10-CM | POA: Diagnosis not present

## 2022-08-04 DIAGNOSIS — R000 Tachycardia, unspecified: Secondary | ICD-10-CM | POA: Diagnosis not present

## 2022-08-04 DIAGNOSIS — Z8546 Personal history of malignant neoplasm of prostate: Secondary | ICD-10-CM

## 2022-08-04 DIAGNOSIS — Z20822 Contact with and (suspected) exposure to covid-19: Secondary | ICD-10-CM | POA: Diagnosis present

## 2022-08-04 DIAGNOSIS — I10 Essential (primary) hypertension: Secondary | ICD-10-CM | POA: Diagnosis present

## 2022-08-04 DIAGNOSIS — Z66 Do not resuscitate: Secondary | ICD-10-CM | POA: Diagnosis present

## 2022-08-04 DIAGNOSIS — Z888 Allergy status to other drugs, medicaments and biological substances status: Secondary | ICD-10-CM

## 2022-08-04 DIAGNOSIS — R0902 Hypoxemia: Secondary | ICD-10-CM | POA: Diagnosis not present

## 2022-08-04 DIAGNOSIS — Z905 Acquired absence of kidney: Secondary | ICD-10-CM

## 2022-08-04 DIAGNOSIS — F32A Depression, unspecified: Secondary | ICD-10-CM | POA: Diagnosis present

## 2022-08-04 DIAGNOSIS — Z515 Encounter for palliative care: Principal | ICD-10-CM

## 2022-08-04 DIAGNOSIS — K922 Gastrointestinal hemorrhage, unspecified: Secondary | ICD-10-CM | POA: Diagnosis present

## 2022-08-04 DIAGNOSIS — R509 Fever, unspecified: Secondary | ICD-10-CM | POA: Diagnosis present

## 2022-08-04 DIAGNOSIS — E785 Hyperlipidemia, unspecified: Secondary | ICD-10-CM | POA: Diagnosis present

## 2022-08-04 DIAGNOSIS — Z87440 Personal history of urinary (tract) infections: Secondary | ICD-10-CM

## 2022-08-04 DIAGNOSIS — I2510 Atherosclerotic heart disease of native coronary artery without angina pectoris: Secondary | ICD-10-CM | POA: Diagnosis present

## 2022-08-04 DIAGNOSIS — Z923 Personal history of irradiation: Secondary | ICD-10-CM

## 2022-08-04 DIAGNOSIS — R404 Transient alteration of awareness: Secondary | ICD-10-CM | POA: Diagnosis not present

## 2022-08-04 DIAGNOSIS — I959 Hypotension, unspecified: Secondary | ICD-10-CM | POA: Diagnosis present

## 2022-08-04 DIAGNOSIS — Z87442 Personal history of urinary calculi: Secondary | ICD-10-CM

## 2022-08-04 DIAGNOSIS — Z833 Family history of diabetes mellitus: Secondary | ICD-10-CM

## 2022-08-04 DIAGNOSIS — Z85528 Personal history of other malignant neoplasm of kidney: Secondary | ICD-10-CM | POA: Diagnosis not present

## 2022-08-04 DIAGNOSIS — Z885 Allergy status to narcotic agent status: Secondary | ICD-10-CM | POA: Diagnosis not present

## 2022-08-04 DIAGNOSIS — Z79899 Other long term (current) drug therapy: Secondary | ICD-10-CM

## 2022-08-04 LAB — SAMPLE TO BLOOD BANK

## 2022-08-04 MED ORDER — ONDANSETRON HCL 4 MG/2ML IJ SOLN: 4.0000 mg | Freq: Four times a day (QID) | INTRAMUSCULAR | Status: DC | PRN

## 2022-08-04 MED ORDER — POLYVINYL ALCOHOL 1.4 % OP SOLN: 1.0000 [drp] | Freq: Four times a day (QID) | OPHTHALMIC | Status: DC | PRN

## 2022-08-04 MED ORDER — ACETAMINOPHEN 325 MG RE SUPP: 650.0000 mg | Freq: Four times a day (QID) | RECTAL | Status: DC | PRN

## 2022-08-04 MED ORDER — MORPHINE SULFATE (PF) 4 MG/ML IV SOLN
4.0000 mg | Freq: Once | INTRAVENOUS | Status: AC
  Administered 2022-08-04: 4 mg via INTRAVENOUS
  Filled 2022-08-04: qty 1

## 2022-08-04 MED ORDER — LORAZEPAM 1 MG PO TABS: 1.0000 mg | ORAL_TABLET | ORAL | Status: DC | PRN

## 2022-08-04 MED ORDER — LORAZEPAM 2 MG/ML IJ SOLN: 1.0000 mg | INTRAMUSCULAR | Status: DC | PRN

## 2022-08-04 MED ORDER — GLYCOPYRROLATE 0.2 MG/ML IJ SOLN: 0.2000 mg | INTRAMUSCULAR | Status: DC | PRN

## 2022-08-04 MED ORDER — ACETAMINOPHEN 325 MG PO TABS: 650.0000 mg | ORAL_TABLET | Freq: Four times a day (QID) | ORAL | Status: DC | PRN

## 2022-08-04 MED ORDER — GLYCOPYRROLATE 1 MG PO TABS: 1.0000 mg | ORAL_TABLET | ORAL | Status: DC | PRN

## 2022-08-04 MED ORDER — HALOPERIDOL LACTATE 5 MG/ML IJ SOLN: 0.5000 mg | INTRAMUSCULAR | Status: DC | PRN

## 2022-08-04 MED ORDER — ONDANSETRON HCL 4 MG/2ML IJ SOLN
4.0000 mg | Freq: Once | INTRAMUSCULAR | Status: AC
  Administered 2022-08-04: 4 mg via INTRAVENOUS
  Filled 2022-08-04: qty 2

## 2022-08-04 MED ORDER — HALOPERIDOL 0.5 MG PO TABS: 0.5000 mg | ORAL_TABLET | ORAL | Status: DC | PRN

## 2022-08-04 MED ORDER — LACTATED RINGERS IV BOLUS: 1000.0000 mL | Freq: Once | INTRAVENOUS | Status: DC

## 2022-08-04 MED ORDER — HALOPERIDOL LACTATE 2 MG/ML PO CONC: 0.5000 mg | ORAL | Status: DC | PRN

## 2022-08-04 MED ORDER — ONDANSETRON 4 MG PO TBDP: 4.0000 mg | ORAL_TABLET | Freq: Four times a day (QID) | ORAL | Status: DC | PRN

## 2022-08-04 MED ORDER — LORAZEPAM 2 MG/ML PO CONC: 1.0000 mg | ORAL | Status: DC | PRN

## 2022-08-04 MED ORDER — BIOTENE DRY MOUTH MT LIQD: 15.0000 mL | OROMUCOSAL | Status: DC | PRN

## 2022-08-04 MED ORDER — MORPHINE SULFATE (PF) 2 MG/ML IV SOLN: 1.0000 mg | INTRAVENOUS | Status: DC | PRN

## 2022-08-21 NOTE — ED Provider Notes (Signed)
Patient was admitted overnight for comfort care in setting of acute GIB. MOST form in place and family confirmed no interventions beyond comfort measures desired. Pt expired at 08:43 with asystole. Family notified and at bedside. Admitting team made aware as he was in the ER at time of death.   Duffy Bruce, MD August 17, 2022 [phone]

## 2022-08-21 NOTE — ED Notes (Signed)
Funeral home contacted, [employer] [place], [place]

## 2022-08-21 NOTE — ED Notes (Signed)
Patient time of death 35, confirmed by this RN. Pt has no respirations and no heart sounds. EKG printed to confirm asystole. Pt family at bedside with patient at time of death. Admitted MD and EDP aware.

## 2022-08-21 NOTE — Assessment & Plan Note (Signed)
Patient with multiple medical problems brought into the ER for evaluation of GI bleed Patient's brother and healthcare power of attorney decided to place patient on comfort measures

## 2022-08-21 NOTE — Assessment & Plan Note (Signed)
Patient was brought into the ER for evaluation of GI bleed  Family has requested no aggressive interventions and wants patient placed on comfort measures

## 2022-08-21 NOTE — ED Notes (Signed)
Pt's brother in room, restates that DNR and MOST form are what the family wishes. Brother is POA. Pt pale, RR varied but constant. Pt not responding to voice or stimulus. Brief dry, pt given blanket and socks and pillow for comfort. Family states they do not want any more VS done. Condolence tray ordered.

## 2022-08-21 NOTE — Progress Notes (Signed)
Chaplain provided spiritual presence to "Sean [name]" per his brother's request. Brother reports Sean [name] has been connected to a congregation for much of his life. Brother provided a life review. Including that Sean [name] was the one everyone went to when they needed something. Chaplain provided listening presence and acknowledgement of grief. Brother is not aware of the final arrangements in Nathan's will. Chaplain provided brother with phone number of AC to call once information is obtained. Chaplain offered EOL education which brother asked for as he has not been with someone before at time of death.     Aug 24, 2022 0200  Clinical Encounter Type  Visited With Patient and family together  Visit Type ED;Patient actively dying  Spiritual Encounters  Spiritual Needs Prayer;Emotional;Grief support

## 2022-08-21 NOTE — ED Notes (Signed)
Pts brother requesting that the patients BP not be taken anymore - pts family's wishes were respected.

## 2022-08-21 NOTE — ED Notes (Signed)
Family declined repeat BP or temp at this time.

## 2022-08-21 NOTE — ED Notes (Signed)
Pts brother at the bedside with Dr. Beather Arbour to discuss plan of care.

## 2022-08-21 NOTE — ED Notes (Signed)
Pt changed and cleansed on arrival to ED. Pt had large amount of stool.

## 2022-08-21 NOTE — ED Notes (Signed)
Chaplain and Brother (POA) at the bedside.

## 2022-08-21 NOTE — H&P (Signed)
History and Physical    Patient: Sean [name] DOB: [birth year] DOA: 2022-08-17 DOS: the patient was seen and examined on 2022-08-17 PCP: Shon Baton, MD  Patient coming from: SNF  Chief Complaint:  Chief Complaint  Patient presents with   GI Bleeding   HPI: Sean [name] is a 76 y.o. male with medical history significant for coronary artery disease, depression, history of prostate cancer who was brought into the ER via EMS from [facility] after he was found during rounds with blood clots in his briefs and coffee-ground emesis dried around his mouth. Patient was hypotensive upon arrival to the ER and family requested no interventions be performed but to keep patient comfortable. Patient is unresponsive and unable to provide any history I am unable to do review of systems on this patient due to his current condition    Review of Systems: unable to review all systems due to the inability of the patient to answer questions. Past Medical History:  Diagnosis Date   Arthritis    Carpal tunnel syndrome    bilat    Cataract    forming    Coronary artery disease CARDIOLOGIST- DR CRENSHAW   Depression    history of at age 59-50 years old; denies current issues    H/O simple renal cyst    Hematuria    History of concussion AS  CHILD-- NO RESIDUAL   History of kidney stones    History of prostate cancer    S/P RADIOACTIVE SEED IMPLANTS AND EXTERNAL RADIATION---  NO RECURRENCE   History of radiation therapy    25 treatments    History of urinary tract infection    History of vertigo    Hypertension    Nocturia    PONV (postoperative nausea and vomiting)    Prostate cancer (HCC)    radiation prior to seed implants    Shortness of breath dyspnea    with increased exertion    Urethral stricture    Wears glasses    Past Surgical History:  Procedure Laterality Date   CARDIAC CATHETERIZATION  12-18-2003   SEVERE TWO VESSEL CAD/  FIRST DIAGONAL  95%  STENOSIS/ CIRCUMFLEX 95% STENOSIS IN THE OSTIUM OF LARGE FIRST OM THAT WAS OCCLUDED AFTER THE FIRST MARGINAL/  COLLATERAL FLOW FROM THE LAD TO THE OBTUSE MARGINALS/  EF 50%---- MEDICAL MANAGEMENT   CARDIOVASCULAR STRESS TEST  05-16-2011   DR CRENSHAW   PROBABLE NORMAL PERFUSION AND SOFT TISSUE ATTENUATION/ NO SIGNIFICANT ISCHEMIA OR SCAR /  EF  59%   COLONOSCOPY     COLONSCOPY WITH POLYPS REMOVED     CYSTOSCOPY N/A 06/06/2016   Procedure: CYSTOSCOPY FLEXIBLE;  Surgeon: Raynelle Bring, MD;  Location: [facility];  Service: Urology;  Laterality: N/A;   CYSTOSCOPY WITH URETHRAL DILATATION  04/25/2012   Procedure: CYSTOSCOPY WITH URETHRAL DILATATION;  Surgeon: Franchot Gallo, MD;  Location: [facility];  Service: Urology;  Laterality: N/A;   CYSTOSCOPY, BALLOON DILATION OF URETHRAL STRICTURE   CYSTOSCOPY WITH URETHRAL DILATATION N/A 04/12/2013   Procedure: CYSTOSCOPY WITH URETHRAL DILATATION;  Surgeon: Franchot Gallo, MD;  Location: [facility];  Service: Urology;  Laterality: N/A;  45 MIN    LUMBAR LAMINECTOMY/DECOMPRESSION MICRODISCECTOMY Left 09/24/2021   Procedure: Left Sided Lumbar Two- Three Microdisectomy;  Surgeon: Kary Kos, MD;  Location: [facility];  Service: Neurosurgery;  Laterality: Left;   POLYPECTOMY     RADIOACTIVE SEED IMPLANTS, PROSTATE  11-26-2010   RIGHT URETEROSCOPIC  STONE EXTRACTION  09-04-2003   ROBOTIC ASSITED PARTIAL NEPHRECTOMY Left 06/06/2016   Procedure: XI ROBOTIC ASSITED PARTIAL NEPHRECTOMY;  Surgeon: Raynelle Bring, MD;  Location: [facility];  Service: Urology;  Laterality: Left;   TRANSTHORACIC ECHOCARDIOGRAM  09-22-2003   LVSF NORMAL/  MILD TO MODERATE PULMONARY REGURG.   Social History:  reports that he has never smoked. He has never used smokeless tobacco. He reports that he does not drink alcohol and does not use drugs.  Allergies  Allergen Reactions   Morphine And Related Nausea And Vomiting    Severe vomiting    Codeine Other (See  Comments)    constipation   Lariam [Mefloquine Hcl] Other (See Comments)    Respiratory problems    Family History  Problem Relation Age of Onset   Diabetes Father    Colon cancer Maternal Uncle    Esophageal cancer Neg Hx    Rectal cancer Neg Hx    Stomach cancer Neg Hx    Colon polyps Neg Hx     Prior to Admission medications   Medication Sig Start Date End Date Taking? Authorizing Provider  allopurinol (ZYLOPRIM) 100 MG tablet Take 200 mg by mouth every evening.     [provider]  APPLE CIDER VINEGAR PO Take 450 mg by mouth daily.    [provider]  Ascorbic Acid (VITAMIN C) 1000 MG tablet Take 2,000 mg by mouth daily.    [provider]  aspirin 81 MG chewable tablet Chew 81 mg by mouth daily.    [provider]  atorvastatin (LIPITOR) 80 MG tablet Take 40 mg by mouth every evening.    [provider]  Cinnamon 500 MG TABS Take 1,000 mg by mouth daily.    [provider]  DULoxetine (CYMBALTA) 60 MG capsule Take 60 mg by mouth daily.    [provider]  ibuprofen (ADVIL) 200 MG tablet Take 600 mg by mouth every 6 (six) hours as needed for mild pain.    [provider]  metoprolol succinate (TOPROL-XL) 50 MG 24 hr tablet Take 100 mg by mouth every morning. Take with or immediately following a meal.    [provider]  Misc Natural Products (TART CHERRY ADVANCED PO) Take 1,200 mg by mouth daily.    [provider]  Multiple Vitamin (MULTIVITAMIN) tablet Take 1 tablet by mouth daily.    [provider]  polyethylene glycol (MIRALAX / GLYCOLAX) 17 g packet Take 17 g by mouth daily as needed for moderate constipation.    [provider]  ramipril (ALTACE) 10 MG capsule Take 10 mg by mouth daily.     [provider]  Turmeric 400 MG CAPS Take 1,600 mg by mouth daily. Take 4 tabs oral once a day per [facility] Medication Care Document 12/29/15   [provider]    Physical Exam: Vitals:   August 12, 2022 0423 12-Aug-2022 0453 August 12, 2022 0500 2022-08-12 0630  BP:      Pulse: (!) 127 (!) 139 (!) 139 (!) 113  Resp: '11 12 11 12  '$ Temp:      TempSrc:      SpO2: 95% 95% 92% 91%  Weight:      Height:       Physical Exam Vitals and nursing note reviewed.  Constitutional:      Comments: Pale, unresponsive, dried blood and mucous membrane  HENT:     Mouth/Throat:     Mouth: Mucous membranes are dry.  Cardiovascular:  Rate and Rhythm: Normal rate and regular rhythm.  Pulmonary:     Effort: Pulmonary effort is normal.     Breath sounds: Normal breath sounds.  Abdominal:     General: Abdomen is flat. Bowel sounds are normal.     Palpations: Abdomen is soft.  Skin:    General: Skin is warm and dry.  Neurological:     Comments: Unable to assess  Psychiatric:     Comments: Unable to assess     Data Reviewed: Relevant notes from primary care and specialist visits, past discharge summaries as available in EHR, including Care Everywhere. Prior diagnostic testing as pertinent to current admission diagnoses Updated medications and problem lists for reconciliation ED course, including vitals, labs, imaging, treatment and response to treatment Triage notes, nursing and pharmacy notes and ED provider's notes Notable results as noted in HPI No labs drawn There are no new results to review at this time.  Assessment and Plan: * GIB (gastrointestinal bleeding) Patient was brought into the ER for evaluation of GI bleed  Family has requested no aggressive interventions and wants patient placed on comfort measures  End of life care Patient with multiple medical problems brought into the ER for evaluation of GI bleed Patient's brother and healthcare power of attorney decided to place patient on comfort measures      Advance Care Planning:   Code Status: DNR   Consults: None  Family Communication: Greater than 50% of time was spent discussing  patient's condition and plan of care with his sister-in-law at the bedside.  Severity of Illness: The appropriate patient status for this patient is INPATIENT. Inpatient status is judged to be reasonable and necessary in order to provide the required intensity of service to ensure the patient's safety. The patient's presenting symptoms, physical exam findings, and initial radiographic and laboratory data in the context of their chronic comorbidities is felt to place them at high risk for further clinical deterioration. Furthermore, it is not anticipated that the patient will be medically stable for discharge from the hospital within 2 midnights of admission.   * I certify that at the point of admission it is my clinical judgment that the patient will require inpatient hospital care spanning beyond 2 midnights from the point of admission due to high intensity of service, high risk for further deterioration and high frequency of surveillance required.*  Author: Collier Bullock, MD Aug 25, 2022 8:54 AM  For on call review [email or web address].

## 2022-08-21 NOTE — ED Triage Notes (Addendum)
Pt arrived via [facility] from [facility], pt was found during rounds tonight his brief had blood clots and had what appeared to be dark, coffee ground emesis dried around mouth, pt is not verbal at this time, but is alert.  Pt arrived with MOST form.  BP 91/72 90% RA placed on 2 L [place] up to 94%.

## 2022-08-21 NOTE — ED Provider Notes (Signed)
Sean [name]    Event Date/Time   First MD Initiated Contact with Patient 08/23/2022 0108     (approximate)   History   GI Bleeding   HPI  Level V caveat: Limited by decreased LOC  [name] is a 76 y.o. male brought to the ED via EMS from [facility] with a chief complaint of GI bleed.  Patient was found during rounds tonight with dark blood clots in his diaper and coffee-ground emesis dried around his mouth.  Rest of history is limited secondary to patient's decreased LOC.  Arrives with MOLST form.  EMS reports family wishes no interventions performed: No lab work, IV, invasive procedures.     Past Medical History   Past Medical History:  Diagnosis Date   Arthritis    Carpal tunnel syndrome    bilat    Cataract    forming    Coronary artery disease CARDIOLOGIST- DR CRENSHAW   Depression    history of at age 75-77 years old; denies current issues    H/O simple renal cyst    Hematuria    History of concussion AS  CHILD-- NO RESIDUAL   History of kidney stones    History of prostate cancer    S/P RADIOACTIVE SEED IMPLANTS AND EXTERNAL RADIATION---  NO RECURRENCE   History of radiation therapy    25 treatments    History of urinary tract infection    History of vertigo    Hypertension    Nocturia    PONV (postoperative nausea and vomiting)    Prostate cancer (HCC)    radiation prior to seed implants    Shortness of breath dyspnea    with increased exertion    Urethral stricture    Wears glasses      Active Problem List   Patient Active Problem List   Diagnosis Date Noted   HNP (herniated nucleus pulposus), lumbar 09/24/2021   Lumbar disc herniation 09/22/2021   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   HYPERLIPIDEMIA 11/26/2009   Essential hypertension 11/26/2009   Coronary atherosclerosis 11/26/2009     Past Surgical History   Past Surgical History:  Procedure Laterality Date   CARDIAC CATHETERIZATION   12-18-2003   SEVERE TWO VESSEL CAD/  FIRST DIAGONAL  95% STENOSIS/ CIRCUMFLEX 95% STENOSIS IN THE OSTIUM OF LARGE FIRST OM THAT WAS OCCLUDED AFTER THE FIRST MARGINAL/  COLLATERAL FLOW FROM THE LAD TO THE OBTUSE MARGINALS/  EF 50%---- MEDICAL MANAGEMENT   CARDIOVASCULAR STRESS TEST  05-16-2011   DR CRENSHAW   PROBABLE NORMAL PERFUSION AND SOFT TISSUE ATTENUATION/ NO SIGNIFICANT ISCHEMIA OR SCAR /  EF  59%   COLONOSCOPY     COLONSCOPY WITH POLYPS REMOVED     CYSTOSCOPY N/A 06/06/2016   Procedure: CYSTOSCOPY FLEXIBLE;  Surgeon: Raynelle Bring, MD;  Location: [facility];  Service: Urology;  Laterality: N/A;   CYSTOSCOPY WITH URETHRAL DILATATION  04/25/2012   Procedure: CYSTOSCOPY WITH URETHRAL DILATATION;  Surgeon: Franchot Gallo, MD;  Location: [facility];  Service: Urology;  Laterality: N/A;   CYSTOSCOPY, BALLOON DILATION OF URETHRAL STRICTURE   CYSTOSCOPY WITH URETHRAL DILATATION N/A 04/12/2013   Procedure: CYSTOSCOPY WITH URETHRAL DILATATION;  Surgeon: Franchot Gallo, MD;  Location: [facility];  Service: Urology;  Laterality: N/A;  45 MIN    LUMBAR LAMINECTOMY/DECOMPRESSION MICRODISCECTOMY Left 09/24/2021   Procedure: Left Sided Lumbar Two- Three Microdisectomy;  Surgeon: Kary Kos, MD;  Location: [place];  Service: Neurosurgery;  Laterality: Left;   POLYPECTOMY     RADIOACTIVE SEED IMPLANTS, PROSTATE  11-26-2010   RIGHT URETEROSCOPIC STONE EXTRACTION  09-04-2003   ROBOTIC ASSITED PARTIAL NEPHRECTOMY Left 06/06/2016   Procedure: XI ROBOTIC ASSITED PARTIAL NEPHRECTOMY;  Surgeon: Raynelle Bring, MD;  Location: [facility];  Service: Urology;  Laterality: Left;   TRANSTHORACIC ECHOCARDIOGRAM  09-22-2003   LVSF NORMAL/  MILD TO MODERATE PULMONARY REGURG.     Home Medications   Prior to Admission medications   Medication Sig Start Date End Date Taking? Authorizing Provider  allopurinol (ZYLOPRIM) 100 MG tablet Take 200 mg by mouth every evening.     [provider]  APPLE CIDER VINEGAR PO Take 450 mg by mouth daily.    [provider]  Ascorbic Acid (VITAMIN C) 1000 MG tablet Take 2,000 mg by mouth daily.    [provider]  aspirin 81 MG chewable tablet Chew 81 mg by mouth daily.    [provider]  atorvastatin (LIPITOR) 80 MG tablet Take 40 mg by mouth every evening.    [provider]  Cinnamon 500 MG TABS Take 1,000 mg by mouth daily.    [provider]  DULoxetine (CYMBALTA) 60 MG capsule Take 60 mg by mouth daily.    [provider]  ibuprofen (ADVIL) 200 MG tablet Take 600 mg by mouth every 6 (six) hours as needed for mild pain.    [provider]  metoprolol succinate (TOPROL-XL) 50 MG 24 hr tablet Take 100 mg by mouth every morning. Take with or immediately following a meal.    [provider]  Misc Natural Products (TART CHERRY ADVANCED PO) Take 1,200 mg by mouth daily.    [provider]  Multiple Vitamin (MULTIVITAMIN) tablet Take 1 tablet by mouth daily.    [provider]  polyethylene glycol (MIRALAX / GLYCOLAX) 17 g packet Take 17 g by mouth daily as needed for moderate constipation.    [provider]  ramipril (ALTACE) 10 MG capsule Take 10 mg by mouth daily.     [provider]  Turmeric 400 MG CAPS Take 1,600 mg by mouth daily. Take 4 tabs oral once a day per [facility] Medication Care Document 12/29/15   [provider]     Allergies  Morphine and related, Codeine, and Lariam [mefloquine hcl]   Family History   Family History  Problem Relation Age of Onset   Diabetes Father    Colon cancer Maternal Uncle    Esophageal cancer Neg Hx    Rectal cancer Neg Hx    Stomach cancer Neg Hx    Colon polyps Neg Hx      Physical Exam  Triage Vital Signs: ED Triage Vitals  Enc Vitals Group     BP      Pulse      Resp      Temp      Temp src      SpO2      Weight      Height      Head Circumference       [facility]      Pain Score      Pain Loc      Pain Edu?      Excl. in [place]?     Updated Vital Signs: BP (!) 64/43   Pulse (!) 139   Temp (!) 100.9 F (38.3 C) (Rectal)   Resp 11  Ht '5\' 10"'$  (1.778 m)   Wt 90 kg   SpO2 92%   BMI 28.47 kg/m    General: Somnolent but arousable to voice.  Evidence of dried coffee-ground emesis around his oropharynx.  CV:  Mildly tachycardic.  Good peripheral perfusion.  Resp:  Increased effort.  CTA B. Abd:  Mildly tender to epigastrium without rebound or guarding.  No distention.  Other:  Rouses to voice, not oriented.   ED Results / Procedures / Treatments  Labs (all labs ordered are listed, but only abnormal results are displayed) Labs Reviewed  SAMPLE TO BLOOD BANK      EKG  ED ECG REPORT I, Byrd Terrero J, the attending physician, personally viewed and interpreted this ECG.   Date: Aug 24, 2022  EKG Time: 0109  Rate: 103  Rhythm: sinus tachycardia  Axis: LAD  Intervals:none  ST&T Change: Nonspecific    RADIOLOGY None   Official radiology report(s): No results found.   PROCEDURES:  Critical Care performed: No  Procedures   MEDICATIONS ORDERED IN ED: Medications  morphine (PF) 4 MG/ML injection 4 mg (4 mg Intravenous Given 08/24/22 0153)  ondansetron (ZOFRAN) injection 4 mg (4 mg Intravenous Given August 24, 2022 0152)  morphine (PF) 4 MG/ML injection 4 mg (4 mg Intravenous Given 24-Aug-2022 0225)  ondansetron (ZOFRAN) injection 4 mg (4 mg Intravenous Given Aug 24, 2022 0225)  morphine (PF) 4 MG/ML injection 4 mg (4 mg Intravenous Given 08-24-22 0414)     IMPRESSION / MDM / ASSESSMENT AND PLAN / ED COURSE  I reviewed the triage vital signs and the nursing notes.                             76 year old male brought to the ED from SNF for GI bleed. Differential diagnosis includes, but is not limited to, biliary disease (biliary colic, acute cholecystitis, cholangitis, choledocholithiasis, etc), intrathoracic causes for epigastric  abdominal pain including ACS, gastritis, duodenitis, pancreatitis, small bowel or large bowel obstruction, abdominal aortic aneurysm, hernia, and ulcer(s).  I have personally reviewed patient's records and [name] his oncology visit from 06/14/2024.  Patient's presentation is most consistent with acute presentation with potential threat to life or bodily function.  The patient is on the cardiac monitor to evaluate for evidence of arrhythmia and/or significant heart rate changes.  Patient arrives with MOST form.  Per EMS, they were instructed not to start IV.  Family member arriving; will discuss with them patient and family's wishes.  Clinical Course as of 08/24/2022 3235  24-Aug-2022 Patient's brother at bedside who is also his power of attorney.  He is calling his wife to discuss what interventions if any they would like to proceed with. [JS]  0138 Patient's brother spoke with his wife and brother and would like to proceed with lab work and IV fluids.  SBP currently 45.  I have discussed with patient's brother it is likely patient will not survive this hospitalization.  Brother wishes to proceed with the above and provide comfort measures. [JS]  5732 Now extremely bradycardic with heart rate in the 20s to 40s.  Have discussed with brother patient is actively dying.  Labs have been drawn but will hold off ordering them as well as chest x-ray.  Will administer morphine and Zofran for comfort care measures. [JS]  2025 Patient with open mouth guppy breathing. Additional Morphine administered. Chaplain at bedside with brother. [JS]  0410 Patient remains with spontaneous pulse and  erratic breathing.  Will administer another dose of IV morphine.  He has been observed for 3 hours in the emergency department.  Will consult hospitalist services for comfort care admission. [JS]    Clinical Course User Index [JS] Paulette Blanch, MD     FINAL CLINICAL IMPRESSION(S) / ED DIAGNOSES   Final diagnoses:   UGIB (upper gastrointestinal bleed)  Fever, unspecified  Dying/death measures     Rx / DC Orders   ED Discharge Orders     None        [name]:  This document was prepared using Dragon voice recognition software and may include unintentional dictation errors.   Paulette Blanch, MD 2022-08-20 [phone]

## 2022-08-21 DEATH — deceased

## 2022-09-30 ENCOUNTER — Other Ambulatory Visit: Payer: Medicare Other

## 2022-09-30 ENCOUNTER — Ambulatory Visit: Payer: Medicare Other | Admitting: Physician Assistant
